# Patient Record
Sex: Female | Born: 1935
Health system: Southern US, Community
[De-identification: ages and names within clinical notes are randomized; demographics above are authoritative.]

## PROBLEM LIST (undated history)

## (undated) DIAGNOSIS — K5792 Diverticulitis of intestine, part unspecified, without perforation or abscess without bleeding: Secondary | ICD-10-CM

## (undated) DIAGNOSIS — C50919 Malignant neoplasm of unspecified site of unspecified female breast: Secondary | ICD-10-CM

## (undated) DIAGNOSIS — M069 Rheumatoid arthritis, unspecified: Secondary | ICD-10-CM

## (undated) DIAGNOSIS — E039 Hypothyroidism, unspecified: Secondary | ICD-10-CM

## (undated) DIAGNOSIS — Z923 Personal history of irradiation: Secondary | ICD-10-CM

## (undated) DIAGNOSIS — Z9221 Personal history of antineoplastic chemotherapy: Secondary | ICD-10-CM

## (undated) DIAGNOSIS — I10 Essential (primary) hypertension: Secondary | ICD-10-CM

## (undated) DIAGNOSIS — N390 Urinary tract infection, site not specified: Secondary | ICD-10-CM

## (undated) DIAGNOSIS — R7303 Prediabetes: Secondary | ICD-10-CM

## (undated) DIAGNOSIS — E785 Hyperlipidemia, unspecified: Secondary | ICD-10-CM

## (undated) DIAGNOSIS — O2230 Deep phlebothrombosis in pregnancy, unspecified trimester: Secondary | ICD-10-CM

## (undated) DIAGNOSIS — D49519 Neoplasm of unspecified behavior of unspecified kidney: Secondary | ICD-10-CM

## (undated) DIAGNOSIS — R9439 Abnormal result of other cardiovascular function study: Secondary | ICD-10-CM

## (undated) HISTORY — PX: CERVICAL SPINE SURGERY: SHX589

## (undated) HISTORY — DX: Prediabetes: R73.03

## (undated) HISTORY — DX: Deep phlebothrombosis in pregnancy, unspecified trimester: O22.30

## (undated) HISTORY — PX: ABDOMINAL SURGERY: SHX537

## (undated) HISTORY — DX: Hyperlipidemia, unspecified: E78.5

## (undated) HISTORY — DX: Diverticulitis of intestine, part unspecified, without perforation or abscess without bleeding: K57.92

## (undated) HISTORY — PX: TONSILLECTOMY: SUR1361

## (undated) HISTORY — DX: Neoplasm of unspecified behavior of unspecified kidney: D49.519

## (undated) HISTORY — PX: CARDIAC CATHETERIZATION: SHX172

## (undated) HISTORY — DX: Abnormal result of other cardiovascular function study: R94.39

## (undated) HISTORY — DX: Essential (primary) hypertension: I10

## (undated) HISTORY — DX: Hypothyroidism, unspecified: E03.9

## (undated) HISTORY — DX: Urinary tract infection, site not specified: N39.0

## (undated) HISTORY — PX: BREAST LUMPECTOMY: SHX2

## (undated) HISTORY — PX: OTHER SURGICAL HISTORY: SHX169

## (undated) HISTORY — DX: Malignant neoplasm of unspecified site of unspecified female breast: C50.919

## (undated) HISTORY — PX: OVARIAN CYST REMOVAL: SHX89

## (undated) HISTORY — DX: Rheumatoid arthritis, unspecified: M06.9

---

## 1998-06-20 ENCOUNTER — Ambulatory Visit (HOSPITAL_COMMUNITY): Admission: RE | Admit: 1998-06-20 | Discharge: 1998-06-20 | Payer: Self-pay | Admitting: Gastroenterology

## 1998-12-20 ENCOUNTER — Other Ambulatory Visit: Admission: RE | Admit: 1998-12-20 | Discharge: 1998-12-20 | Payer: Self-pay | Admitting: Obstetrics and Gynecology

## 1999-02-18 ENCOUNTER — Ambulatory Visit (HOSPITAL_COMMUNITY): Admission: RE | Admit: 1999-02-18 | Discharge: 1999-02-18 | Payer: Self-pay | Admitting: Gastroenterology

## 1999-04-02 ENCOUNTER — Ambulatory Visit (HOSPITAL_COMMUNITY): Admission: RE | Admit: 1999-04-02 | Discharge: 1999-04-02 | Payer: Self-pay | Admitting: General Surgery

## 2000-01-07 ENCOUNTER — Other Ambulatory Visit: Admission: RE | Admit: 2000-01-07 | Discharge: 2000-01-07 | Payer: Self-pay | Admitting: Family Medicine

## 2000-04-03 ENCOUNTER — Encounter (INDEPENDENT_AMBULATORY_CARE_PROVIDER_SITE_OTHER): Payer: Self-pay

## 2000-04-03 ENCOUNTER — Ambulatory Visit (HOSPITAL_COMMUNITY): Admission: RE | Admit: 2000-04-03 | Discharge: 2000-04-03 | Payer: Self-pay | Admitting: Obstetrics and Gynecology

## 2000-12-17 ENCOUNTER — Other Ambulatory Visit: Admission: RE | Admit: 2000-12-17 | Discharge: 2000-12-17 | Payer: Self-pay | Admitting: Obstetrics and Gynecology

## 2001-10-30 ENCOUNTER — Encounter: Payer: Self-pay | Admitting: Neurosurgery

## 2001-10-30 ENCOUNTER — Ambulatory Visit (HOSPITAL_COMMUNITY): Admission: RE | Admit: 2001-10-30 | Discharge: 2001-10-30 | Payer: Self-pay | Admitting: Neurosurgery

## 2001-11-17 ENCOUNTER — Encounter: Payer: Self-pay | Admitting: Neurosurgery

## 2001-11-17 ENCOUNTER — Encounter: Admission: RE | Admit: 2001-11-17 | Discharge: 2001-11-17 | Payer: Self-pay | Admitting: Neurosurgery

## 2002-02-10 ENCOUNTER — Encounter: Payer: Self-pay | Admitting: Neurosurgery

## 2002-02-10 ENCOUNTER — Encounter: Admission: RE | Admit: 2002-02-10 | Discharge: 2002-02-10 | Payer: Self-pay | Admitting: Neurosurgery

## 2002-10-14 ENCOUNTER — Other Ambulatory Visit: Admission: RE | Admit: 2002-10-14 | Discharge: 2002-10-14 | Payer: Self-pay | Admitting: Obstetrics and Gynecology

## 2003-11-07 ENCOUNTER — Encounter (INDEPENDENT_AMBULATORY_CARE_PROVIDER_SITE_OTHER): Payer: Self-pay | Admitting: *Deleted

## 2003-11-07 ENCOUNTER — Inpatient Hospital Stay (HOSPITAL_COMMUNITY): Admission: RE | Admit: 2003-11-07 | Discharge: 2003-11-09 | Payer: Self-pay | Admitting: Obstetrics and Gynecology

## 2005-04-23 ENCOUNTER — Ambulatory Visit (HOSPITAL_COMMUNITY): Admission: RE | Admit: 2005-04-23 | Discharge: 2005-04-23 | Payer: Self-pay | Admitting: Neurosurgery

## 2006-10-13 DIAGNOSIS — C50919 Malignant neoplasm of unspecified site of unspecified female breast: Secondary | ICD-10-CM

## 2006-10-13 HISTORY — DX: Malignant neoplasm of unspecified site of unspecified female breast: C50.919

## 2007-02-10 ENCOUNTER — Encounter: Admission: RE | Admit: 2007-02-10 | Discharge: 2007-02-10 | Payer: Self-pay | Admitting: Gastroenterology

## 2007-05-10 ENCOUNTER — Encounter: Admission: RE | Admit: 2007-05-10 | Discharge: 2007-05-10 | Payer: Self-pay | Admitting: Family Medicine

## 2007-05-10 ENCOUNTER — Encounter (INDEPENDENT_AMBULATORY_CARE_PROVIDER_SITE_OTHER): Payer: Self-pay | Admitting: Diagnostic Radiology

## 2007-05-20 ENCOUNTER — Encounter: Admission: RE | Admit: 2007-05-20 | Discharge: 2007-05-20 | Payer: Self-pay | Admitting: Family Medicine

## 2007-05-31 ENCOUNTER — Encounter: Admission: RE | Admit: 2007-05-31 | Discharge: 2007-05-31 | Payer: Self-pay | Admitting: General Surgery

## 2007-06-02 ENCOUNTER — Ambulatory Visit (HOSPITAL_BASED_OUTPATIENT_CLINIC_OR_DEPARTMENT_OTHER): Admission: RE | Admit: 2007-06-02 | Discharge: 2007-06-02 | Payer: Self-pay | Admitting: Urology

## 2007-06-02 ENCOUNTER — Encounter (INDEPENDENT_AMBULATORY_CARE_PROVIDER_SITE_OTHER): Payer: Self-pay | Admitting: General Surgery

## 2007-06-07 ENCOUNTER — Encounter (INDEPENDENT_AMBULATORY_CARE_PROVIDER_SITE_OTHER): Payer: Self-pay | Admitting: General Surgery

## 2007-06-08 ENCOUNTER — Ambulatory Visit: Payer: Self-pay | Admitting: Oncology

## 2007-06-16 LAB — CBC WITH DIFFERENTIAL/PLATELET
Basophils Absolute: 0 10*3/uL (ref 0.0–0.1)
EOS%: 3.3 % (ref 0.0–7.0)
Eosinophils Absolute: 0.2 10*3/uL (ref 0.0–0.5)
HCT: 34.1 % — ABNORMAL LOW (ref 34.8–46.6)
HGB: 11.8 g/dL (ref 11.6–15.9)
MCH: 32.5 pg (ref 26.0–34.0)
MCV: 93.9 fL (ref 81.0–101.0)
NEUT#: 5.8 10*3/uL (ref 1.5–6.5)
NEUT%: 78.1 % — ABNORMAL HIGH (ref 39.6–76.8)
lymph#: 0.8 10*3/uL — ABNORMAL LOW (ref 0.9–3.3)

## 2007-06-21 LAB — COMPREHENSIVE METABOLIC PANEL
AST: 25 U/L (ref 0–37)
Albumin: 4 g/dL (ref 3.5–5.2)
BUN: 14 mg/dL (ref 6–23)
Calcium: 9.2 mg/dL (ref 8.4–10.5)
Chloride: 105 mEq/L (ref 96–112)
Creatinine, Ser: 0.72 mg/dL (ref 0.40–1.20)
Glucose, Bld: 132 mg/dL — ABNORMAL HIGH (ref 70–99)
Potassium: 4 mEq/L (ref 3.5–5.3)

## 2007-06-21 LAB — VITAMIN D PNL(25-HYDRXY+1,25-DIHY)-BLD: Vit D, 1,25-Dihydroxy: 44 pg/mL (ref 6–62)

## 2007-06-21 LAB — CANCER ANTIGEN 27.29: CA 27.29: 12 U/mL (ref 0–39)

## 2007-06-23 ENCOUNTER — Encounter: Admission: RE | Admit: 2007-06-23 | Discharge: 2007-06-23 | Payer: Self-pay | Admitting: Rheumatology

## 2007-06-24 ENCOUNTER — Ambulatory Visit: Admission: RE | Admit: 2007-06-24 | Discharge: 2007-06-24 | Payer: Self-pay | Admitting: Oncology

## 2007-06-24 ENCOUNTER — Encounter: Payer: Self-pay | Admitting: Oncology

## 2007-06-25 ENCOUNTER — Ambulatory Visit (HOSPITAL_COMMUNITY): Admission: RE | Admit: 2007-06-25 | Discharge: 2007-06-25 | Payer: Self-pay | Admitting: Oncology

## 2007-06-29 LAB — CBC WITH DIFFERENTIAL/PLATELET
Basophils Absolute: 0.1 10*3/uL (ref 0.0–0.1)
EOS%: 2.2 % (ref 0.0–7.0)
HGB: 13.3 g/dL (ref 11.6–15.9)
MCH: 31.8 pg (ref 26.0–34.0)
MCV: 90.7 fL (ref 81.0–101.0)
MONO%: 8.5 % (ref 0.0–13.0)
RDW: 11.1 % — ABNORMAL LOW (ref 11.3–14.5)

## 2007-07-05 ENCOUNTER — Ambulatory Visit (HOSPITAL_COMMUNITY): Admission: RE | Admit: 2007-07-05 | Discharge: 2007-07-05 | Payer: Self-pay | Admitting: General Surgery

## 2007-07-06 LAB — CBC WITH DIFFERENTIAL/PLATELET
BASO%: 1.3 % (ref 0.0–2.0)
EOS%: 2.5 % (ref 0.0–7.0)
Eosinophils Absolute: 0.2 10*3/uL (ref 0.0–0.5)
MCH: 31.5 pg (ref 26.0–34.0)
MCHC: 34.9 g/dL (ref 32.0–36.0)
MCV: 90.3 fL (ref 81.0–101.0)
MONO%: 5.7 % (ref 0.0–13.0)
NEUT#: 5.8 10*3/uL (ref 1.5–6.5)
RBC: 3.92 10*6/uL (ref 3.70–5.32)
RDW: 10.7 % — ABNORMAL LOW (ref 11.3–14.5)

## 2007-07-13 LAB — CBC WITH DIFFERENTIAL/PLATELET
BASO%: 1.2 % (ref 0.0–2.0)
Eosinophils Absolute: 0.2 10*3/uL (ref 0.0–0.5)
MONO#: 0.5 10*3/uL (ref 0.1–0.9)
NEUT#: 3.5 10*3/uL (ref 1.5–6.5)
RBC: 3.56 10*6/uL — ABNORMAL LOW (ref 3.70–5.32)
RDW: 11.3 % (ref 11.3–14.5)
WBC: 5.8 10*3/uL (ref 3.9–10.0)

## 2007-07-13 LAB — COMPREHENSIVE METABOLIC PANEL
Alkaline Phosphatase: 81 U/L (ref 39–117)
BUN: 17 mg/dL (ref 6–23)
Glucose, Bld: 98 mg/dL (ref 70–99)
Total Bilirubin: 0.3 mg/dL (ref 0.3–1.2)

## 2007-07-20 ENCOUNTER — Ambulatory Visit (HOSPITAL_COMMUNITY): Admission: RE | Admit: 2007-07-20 | Discharge: 2007-07-20 | Payer: Self-pay | Admitting: Oncology

## 2007-07-20 LAB — CBC WITH DIFFERENTIAL/PLATELET
Basophils Absolute: 0.1 10*3/uL (ref 0.0–0.1)
Eosinophils Absolute: 0.2 10*3/uL (ref 0.0–0.5)
HGB: 11.1 g/dL — ABNORMAL LOW (ref 11.6–15.9)
LYMPH%: 28.1 % (ref 14.0–48.0)
MCV: 92.3 fL (ref 81.0–101.0)
MONO#: 0.4 10*3/uL (ref 0.1–0.9)
MONO%: 10.9 % (ref 0.0–13.0)
NEUT#: 2.1 10*3/uL (ref 1.5–6.5)
Platelets: 222 10*3/uL (ref 145–400)
RDW: 11.2 % — ABNORMAL LOW (ref 11.3–14.5)
WBC: 3.9 10*3/uL (ref 3.9–10.0)

## 2007-07-23 ENCOUNTER — Ambulatory Visit: Payer: Self-pay | Admitting: Oncology

## 2007-07-27 LAB — CBC WITH DIFFERENTIAL/PLATELET
Eosinophils Absolute: 0.1 10*3/uL (ref 0.0–0.5)
HCT: 34.3 % — ABNORMAL LOW (ref 34.8–46.6)
LYMPH%: 13.1 % — ABNORMAL LOW (ref 14.0–48.0)
MCV: 94.4 fL (ref 81.0–101.0)
MONO#: 0.3 10*3/uL (ref 0.1–0.9)
MONO%: 6.5 % (ref 0.0–13.0)
NEUT#: 3.5 10*3/uL (ref 1.5–6.5)
NEUT%: 76.4 % (ref 39.6–76.8)
Platelets: 242 10*3/uL (ref 145–400)
RBC: 3.63 10*6/uL — ABNORMAL LOW (ref 3.70–5.32)

## 2007-08-03 LAB — CBC WITH DIFFERENTIAL/PLATELET
BASO%: 1.1 % (ref 0.0–2.0)
EOS%: 2.1 % (ref 0.0–7.0)
HCT: 34.1 % — ABNORMAL LOW (ref 34.8–46.6)
MCH: 32.1 pg (ref 26.0–34.0)
MCHC: 34.7 g/dL (ref 32.0–36.0)
MONO#: 0.6 10*3/uL (ref 0.1–0.9)
NEUT%: 80.2 % — ABNORMAL HIGH (ref 39.6–76.8)
RBC: 3.69 10*6/uL — ABNORMAL LOW (ref 3.70–5.32)
WBC: 8.1 10*3/uL (ref 3.9–10.0)
lymph#: 0.8 10*3/uL — ABNORMAL LOW (ref 0.9–3.3)

## 2007-08-10 LAB — CBC WITH DIFFERENTIAL/PLATELET
BASO%: 1.4 % (ref 0.0–2.0)
EOS%: 4.7 % (ref 0.0–7.0)
HCT: 35.5 % (ref 34.8–46.6)
LYMPH%: 19.8 % (ref 14.0–48.0)
MCH: 31.7 pg (ref 26.0–34.0)
MCHC: 34.7 g/dL (ref 32.0–36.0)
MCV: 91.6 fL (ref 81.0–101.0)
MONO#: 0.4 10*3/uL (ref 0.1–0.9)
MONO%: 6.6 % (ref 0.0–13.0)
NEUT%: 67.6 % (ref 39.6–76.8)
Platelets: 206 10*3/uL (ref 145–400)
RBC: 3.88 10*6/uL (ref 3.70–5.32)
WBC: 6.1 10*3/uL (ref 3.9–10.0)

## 2007-08-17 LAB — CBC WITH DIFFERENTIAL/PLATELET
Eosinophils Absolute: 0.3 10*3/uL (ref 0.0–0.5)
HCT: 34.1 % — ABNORMAL LOW (ref 34.8–46.6)
LYMPH%: 22.1 % (ref 14.0–48.0)
MONO#: 0.4 10*3/uL (ref 0.1–0.9)
NEUT#: 2.8 10*3/uL (ref 1.5–6.5)
Platelets: 204 10*3/uL (ref 145–400)
RBC: 3.73 10*6/uL (ref 3.70–5.32)
WBC: 4.6 10*3/uL (ref 3.9–10.0)
lymph#: 1 10*3/uL (ref 0.9–3.3)

## 2007-08-24 LAB — CBC WITH DIFFERENTIAL/PLATELET
Basophils Absolute: 0.1 10*3/uL (ref 0.0–0.1)
Eosinophils Absolute: 0.2 10*3/uL (ref 0.0–0.5)
HCT: 35.1 % (ref 34.8–46.6)
HGB: 12.1 g/dL (ref 11.6–15.9)
LYMPH%: 20.2 % (ref 14.0–48.0)
MCHC: 34.6 g/dL (ref 32.0–36.0)
MONO#: 0.5 10*3/uL (ref 0.1–0.9)
NEUT#: 3.8 10*3/uL (ref 1.5–6.5)
NEUT%: 66.1 % (ref 39.6–76.8)
Platelets: 241 10*3/uL (ref 145–400)
WBC: 5.8 10*3/uL (ref 3.9–10.0)
lymph#: 1.2 10*3/uL (ref 0.9–3.3)

## 2007-08-31 LAB — CBC WITH DIFFERENTIAL/PLATELET
BASO%: 0.2 % (ref 0.0–2.0)
Basophils Absolute: 0 10*3/uL (ref 0.0–0.1)
EOS%: 1.7 % (ref 0.0–7.0)
HCT: 35.3 % (ref 34.8–46.6)
HGB: 12.1 g/dL (ref 11.6–15.9)
LYMPH%: 11.8 % — ABNORMAL LOW (ref 14.0–48.0)
MCH: 31.2 pg (ref 26.0–34.0)
MCHC: 34.3 g/dL (ref 32.0–36.0)
MCV: 90.8 fL (ref 81.0–101.0)
NEUT%: 82.2 % — ABNORMAL HIGH (ref 39.6–76.8)
Platelets: 220 10*3/uL (ref 145–400)

## 2007-08-31 LAB — COMPREHENSIVE METABOLIC PANEL
ALT: 29 U/L (ref 0–35)
AST: 26 U/L (ref 0–37)
BUN: 16 mg/dL (ref 6–23)
Calcium: 9.2 mg/dL (ref 8.4–10.5)
Chloride: 107 mEq/L (ref 96–112)
Creatinine, Ser: 0.87 mg/dL (ref 0.40–1.20)
Total Bilirubin: 0.6 mg/dL (ref 0.3–1.2)

## 2007-09-17 ENCOUNTER — Ambulatory Visit: Payer: Self-pay | Admitting: Oncology

## 2007-09-21 ENCOUNTER — Ambulatory Visit: Admission: RE | Admit: 2007-09-21 | Discharge: 2007-10-13 | Payer: Self-pay | Admitting: Radiation Oncology

## 2007-09-21 LAB — CBC WITH DIFFERENTIAL/PLATELET
Basophils Absolute: 0.1 10*3/uL (ref 0.0–0.1)
HCT: 34.4 % — ABNORMAL LOW (ref 34.8–46.6)
HGB: 12.1 g/dL (ref 11.6–15.9)
LYMPH%: 12.7 % — ABNORMAL LOW (ref 14.0–48.0)
MCH: 31 pg (ref 26.0–34.0)
MCHC: 35.2 g/dL (ref 32.0–36.0)
MONO#: 0.6 10*3/uL (ref 0.1–0.9)
NEUT%: 70.6 % (ref 39.6–76.8)
Platelets: 179 10*3/uL (ref 145–400)
WBC: 6.7 10*3/uL (ref 3.9–10.0)
lymph#: 0.9 10*3/uL (ref 0.9–3.3)

## 2007-09-29 ENCOUNTER — Ambulatory Visit: Admission: RE | Admit: 2007-09-29 | Discharge: 2007-09-29 | Payer: Self-pay | Admitting: Oncology

## 2007-09-29 ENCOUNTER — Ambulatory Visit: Payer: Self-pay | Admitting: Cardiology

## 2007-09-29 ENCOUNTER — Encounter: Payer: Self-pay | Admitting: Oncology

## 2007-10-12 LAB — CBC WITH DIFFERENTIAL/PLATELET
BASO%: 0.2 % (ref 0.0–2.0)
Basophils Absolute: 0 10*3/uL (ref 0.0–0.1)
EOS%: 3.8 % (ref 0.0–7.0)
HCT: 34.1 % — ABNORMAL LOW (ref 34.8–46.6)
HGB: 11.8 g/dL (ref 11.6–15.9)
LYMPH%: 10 % — ABNORMAL LOW (ref 14.0–48.0)
MCH: 31.2 pg (ref 26.0–34.0)
MCHC: 34.7 g/dL (ref 32.0–36.0)
MCV: 90 fL (ref 81.0–101.0)
MONO%: 0.8 % (ref 0.0–13.0)
NEUT%: 85.2 % — ABNORMAL HIGH (ref 39.6–76.8)

## 2007-10-12 LAB — COMPREHENSIVE METABOLIC PANEL
Albumin: 4 g/dL (ref 3.5–5.2)
BUN: 17 mg/dL (ref 6–23)
CO2: 28 mEq/L (ref 19–32)
Calcium: 9.3 mg/dL (ref 8.4–10.5)
Chloride: 107 mEq/L (ref 96–112)
Glucose, Bld: 110 mg/dL — ABNORMAL HIGH (ref 70–99)
Potassium: 4.1 mEq/L (ref 3.5–5.3)

## 2007-10-14 ENCOUNTER — Ambulatory Visit: Admission: RE | Admit: 2007-10-14 | Discharge: 2007-12-19 | Payer: Self-pay | Admitting: Radiation Oncology

## 2007-10-15 ENCOUNTER — Ambulatory Visit: Payer: Self-pay | Admitting: Oncology

## 2007-10-19 LAB — CBC WITH DIFFERENTIAL/PLATELET
Eosinophils Absolute: 0.3 10*3/uL (ref 0.0–0.5)
MCV: 89.9 fL (ref 81.0–101.0)
MONO%: 10.4 % (ref 0.0–13.0)
NEUT#: 5.3 10*3/uL (ref 1.5–6.5)
RBC: 3.98 10*6/uL (ref 3.70–5.32)
RDW: 11.7 % (ref 11.3–14.5)
WBC: 8 10*3/uL (ref 3.9–10.0)

## 2007-10-26 LAB — CBC WITH DIFFERENTIAL/PLATELET
Eosinophils Absolute: 0.4 10*3/uL (ref 0.0–0.5)
LYMPH%: 15.3 % (ref 14.0–48.0)
MONO#: 0.9 10*3/uL (ref 0.1–0.9)
NEUT#: 4.6 10*3/uL (ref 1.5–6.5)
Platelets: 245 10*3/uL (ref 145–400)
RBC: 3.97 10*6/uL (ref 3.70–5.32)
RDW: 11.9 % (ref 11.3–14.5)
WBC: 7.2 10*3/uL (ref 3.9–10.0)
lymph#: 1.1 10*3/uL (ref 0.9–3.3)

## 2007-11-02 LAB — CBC WITH DIFFERENTIAL/PLATELET
Basophils Absolute: 0.1 10*3/uL (ref 0.0–0.1)
Eosinophils Absolute: 0.3 10*3/uL (ref 0.0–0.5)
HCT: 36.1 % (ref 34.8–46.6)
HGB: 12.3 g/dL (ref 11.6–15.9)
LYMPH%: 11.2 % — ABNORMAL LOW (ref 14.0–48.0)
MONO#: 0.9 10*3/uL (ref 0.1–0.9)
NEUT#: 5.4 10*3/uL (ref 1.5–6.5)
NEUT%: 71.7 % (ref 39.6–76.8)
Platelets: 208 10*3/uL (ref 145–400)
WBC: 7.5 10*3/uL (ref 3.9–10.0)

## 2007-11-09 LAB — CBC WITH DIFFERENTIAL/PLATELET
Eosinophils Absolute: 0.3 10*3/uL (ref 0.0–0.5)
HCT: 35 % (ref 34.8–46.6)
HGB: 12.1 g/dL (ref 11.6–15.9)
LYMPH%: 8.8 % — ABNORMAL LOW (ref 14.0–48.0)
MONO#: 0.9 10*3/uL (ref 0.1–0.9)
NEUT#: 6.5 10*3/uL (ref 1.5–6.5)
NEUT%: 76.5 % (ref 39.6–76.8)
Platelets: 185 10*3/uL (ref 145–400)
WBC: 8.6 10*3/uL (ref 3.9–10.0)

## 2007-11-16 LAB — COMPREHENSIVE METABOLIC PANEL
ALT: 18 U/L (ref 0–35)
AST: 21 U/L (ref 0–37)
Alkaline Phosphatase: 78 U/L (ref 39–117)
BUN: 16 mg/dL (ref 6–23)
Creatinine, Ser: 0.75 mg/dL (ref 0.40–1.20)
Total Bilirubin: 0.4 mg/dL (ref 0.3–1.2)

## 2007-11-16 LAB — CBC WITH DIFFERENTIAL/PLATELET
BASO%: 1.3 % (ref 0.0–2.0)
MCHC: 34.8 g/dL (ref 32.0–36.0)
MONO#: 0.8 10*3/uL (ref 0.1–0.9)
RBC: 4.05 10*6/uL (ref 3.70–5.32)
RDW: 11.4 % (ref 11.3–14.5)
WBC: 10.1 10*3/uL — ABNORMAL HIGH (ref 3.9–10.0)
lymph#: 0.6 10*3/uL — ABNORMAL LOW (ref 0.9–3.3)

## 2007-11-16 LAB — CANCER ANTIGEN 27.29: CA 27.29: 7 U/mL (ref 0–39)

## 2007-11-23 LAB — CBC WITH DIFFERENTIAL/PLATELET
BASO%: 0.5 % (ref 0.0–2.0)
Basophils Absolute: 0 10*3/uL (ref 0.0–0.1)
HCT: 34.9 % (ref 34.8–46.6)
HGB: 12.2 g/dL (ref 11.6–15.9)
MONO#: 0.6 10*3/uL (ref 0.1–0.9)
NEUT%: 75.7 % (ref 39.6–76.8)
RDW: 13.5 % (ref 11.3–14.5)
WBC: 6 10*3/uL (ref 3.9–10.0)
lymph#: 0.7 10*3/uL — ABNORMAL LOW (ref 0.9–3.3)

## 2007-12-09 ENCOUNTER — Ambulatory Visit: Payer: Self-pay | Admitting: Oncology

## 2007-12-14 LAB — CBC WITH DIFFERENTIAL/PLATELET
BASO%: 0.9 % (ref 0.0–2.0)
Basophils Absolute: 0.1 10*3/uL (ref 0.0–0.1)
EOS%: 2.3 % (ref 0.0–7.0)
Eosinophils Absolute: 0.2 10*3/uL (ref 0.0–0.5)
HCT: 35.9 % (ref 34.8–46.6)
HGB: 12.6 g/dL (ref 11.6–15.9)
LYMPH%: 8.5 % — ABNORMAL LOW (ref 14.0–48.0)
MCH: 31.6 pg (ref 26.0–34.0)
MCHC: 35.1 g/dL (ref 32.0–36.0)
MCV: 89.9 fL (ref 81.0–101.0)
MONO#: 0.8 10*3/uL (ref 0.1–0.9)
MONO%: 11.7 % (ref 0.0–13.0)
NEUT#: 5.5 10*3/uL (ref 1.5–6.5)
NEUT%: 76.6 % (ref 39.6–76.8)
Platelets: 212 10*3/uL (ref 145–400)
RBC: 3.99 10*6/uL (ref 3.70–5.32)
RDW: 12.4 % (ref 11.3–14.5)
WBC: 7.2 10*3/uL (ref 3.9–10.0)
lymph#: 0.6 10*3/uL — ABNORMAL LOW (ref 0.9–3.3)

## 2007-12-29 ENCOUNTER — Ambulatory Visit: Payer: Self-pay | Admitting: Cardiology

## 2007-12-29 ENCOUNTER — Ambulatory Visit: Admission: RE | Admit: 2007-12-29 | Discharge: 2007-12-29 | Payer: Self-pay | Admitting: Oncology

## 2007-12-29 ENCOUNTER — Encounter: Payer: Self-pay | Admitting: Oncology

## 2008-01-05 LAB — CBC WITH DIFFERENTIAL/PLATELET
BASO%: 1.4 % (ref 0.0–2.0)
Basophils Absolute: 0.2 10*3/uL — ABNORMAL HIGH (ref 0.0–0.1)
EOS%: 1.2 % (ref 0.0–7.0)
HCT: 35.8 % (ref 34.8–46.6)
HGB: 12.6 g/dL (ref 11.6–15.9)
MCH: 31.4 pg (ref 26.0–34.0)
MCHC: 35.2 g/dL (ref 32.0–36.0)
MCV: 89.2 fL (ref 81.0–101.0)
MONO%: 6.9 % (ref 0.0–13.0)
NEUT%: 85.8 % — ABNORMAL HIGH (ref 39.6–76.8)

## 2008-02-07 ENCOUNTER — Ambulatory Visit (HOSPITAL_BASED_OUTPATIENT_CLINIC_OR_DEPARTMENT_OTHER): Admission: RE | Admit: 2008-02-07 | Discharge: 2008-02-07 | Payer: Self-pay | Admitting: General Surgery

## 2008-03-29 ENCOUNTER — Ambulatory Visit (HOSPITAL_COMMUNITY): Admission: RE | Admit: 2008-03-29 | Discharge: 2008-03-29 | Payer: Self-pay | Admitting: Urology

## 2008-04-18 ENCOUNTER — Encounter: Admission: RE | Admit: 2008-04-18 | Discharge: 2008-04-18 | Payer: Self-pay | Admitting: General Surgery

## 2008-05-31 ENCOUNTER — Encounter: Admission: RE | Admit: 2008-05-31 | Discharge: 2008-05-31 | Payer: Self-pay | Admitting: Rheumatology

## 2008-07-07 ENCOUNTER — Ambulatory Visit: Payer: Self-pay | Admitting: Oncology

## 2008-07-11 ENCOUNTER — Ambulatory Visit: Admission: RE | Admit: 2008-07-11 | Discharge: 2008-07-11 | Payer: Self-pay | Admitting: Oncology

## 2008-07-11 ENCOUNTER — Encounter: Payer: Self-pay | Admitting: Oncology

## 2008-07-11 LAB — CBC WITH DIFFERENTIAL/PLATELET
BASO%: 0 % (ref 0.0–2.0)
Basophils Absolute: 0 10e3/uL (ref 0.0–0.1)
EOS%: 2.2 % (ref 0.0–7.0)
Eosinophils Absolute: 0.2 10e3/uL (ref 0.0–0.5)
HCT: 35.9 % (ref 34.8–46.6)
HGB: 12.3 g/dL (ref 11.6–15.9)
LYMPH%: 9.3 % — ABNORMAL LOW (ref 14.0–48.0)
MCH: 32.7 pg (ref 26.0–34.0)
MCHC: 34.3 g/dL (ref 32.0–36.0)
MCV: 95.4 fL (ref 81.0–101.0)
MONO#: 0.8 10e3/uL (ref 0.1–0.9)
MONO%: 9.9 % (ref 0.0–13.0)
NEUT#: 6.2 10e3/uL (ref 1.5–6.5)
NEUT%: 78.6 % — ABNORMAL HIGH (ref 39.6–76.8)
Platelets: 219 10e3/uL (ref 145–400)
RBC: 3.76 10e6/uL (ref 3.70–5.32)
RDW: 14.3 % (ref 11.3–14.5)
WBC: 7.9 10e3/uL (ref 3.9–10.0)
lymph#: 0.7 10e3/uL — ABNORMAL LOW (ref 0.9–3.3)

## 2008-07-11 LAB — COMPREHENSIVE METABOLIC PANEL WITH GFR
ALT: 16 U/L (ref 0–35)
AST: 18 U/L (ref 0–37)
Albumin: 4.1 g/dL (ref 3.5–5.2)
Alkaline Phosphatase: 84 U/L (ref 39–117)
BUN: 15 mg/dL (ref 6–23)
CO2: 28 meq/L (ref 19–32)
Calcium: 9.4 mg/dL (ref 8.4–10.5)
Chloride: 99 meq/L (ref 96–112)
Creatinine, Ser: 0.83 mg/dL (ref 0.40–1.20)
Glucose, Bld: 104 mg/dL — ABNORMAL HIGH (ref 70–99)
Potassium: 3.7 meq/L (ref 3.5–5.3)
Sodium: 136 meq/L (ref 135–145)
Total Bilirubin: 0.4 mg/dL (ref 0.3–1.2)
Total Protein: 6.5 g/dL (ref 6.0–8.3)

## 2008-07-11 LAB — CANCER ANTIGEN 27.29: CA 27.29: 12 U/mL (ref 0–39)

## 2008-09-12 ENCOUNTER — Ambulatory Visit (HOSPITAL_COMMUNITY): Admission: RE | Admit: 2008-09-12 | Discharge: 2008-09-12 | Payer: Self-pay | Admitting: Urology

## 2008-10-13 DIAGNOSIS — D49519 Neoplasm of unspecified behavior of unspecified kidney: Secondary | ICD-10-CM

## 2008-10-13 HISTORY — DX: Neoplasm of unspecified behavior of unspecified kidney: D49.519

## 2008-10-17 ENCOUNTER — Encounter: Admission: RE | Admit: 2008-10-17 | Discharge: 2008-10-17 | Payer: Self-pay | Admitting: Urology

## 2008-10-20 ENCOUNTER — Ambulatory Visit: Payer: Self-pay | Admitting: Oncology

## 2008-10-24 ENCOUNTER — Ambulatory Visit (HOSPITAL_COMMUNITY): Admission: RE | Admit: 2008-10-24 | Discharge: 2008-10-24 | Payer: Self-pay | Admitting: Oncology

## 2008-10-24 LAB — CBC WITH DIFFERENTIAL/PLATELET
BASO%: 0.3 % (ref 0.0–2.0)
Eosinophils Absolute: 0.2 10*3/uL (ref 0.0–0.5)
MCHC: 34.3 g/dL (ref 32.0–36.0)
MCV: 98.4 fL (ref 81.0–101.0)
MONO#: 0.6 10*3/uL (ref 0.1–0.9)
MONO%: 9.2 % (ref 0.0–13.0)
NEUT#: 5.2 10*3/uL (ref 1.5–6.5)
RBC: 3.8 10*6/uL (ref 3.70–5.32)
RDW: 14.8 % — ABNORMAL HIGH (ref 11.3–14.5)
WBC: 6.6 10*3/uL (ref 3.9–10.0)

## 2008-10-24 LAB — COMPREHENSIVE METABOLIC PANEL
ALT: 27 U/L (ref 0–35)
Albumin: 3.9 g/dL (ref 3.5–5.2)
Alkaline Phosphatase: 64 U/L (ref 39–117)
Glucose, Bld: 102 mg/dL — ABNORMAL HIGH (ref 70–99)
Potassium: 3.4 mEq/L — ABNORMAL LOW (ref 3.5–5.3)
Sodium: 134 mEq/L — ABNORMAL LOW (ref 135–145)
Total Bilirubin: 0.4 mg/dL (ref 0.3–1.2)
Total Protein: 6.6 g/dL (ref 6.0–8.3)

## 2008-11-17 ENCOUNTER — Encounter (INDEPENDENT_AMBULATORY_CARE_PROVIDER_SITE_OTHER): Payer: Self-pay | Admitting: Interventional Radiology

## 2008-11-17 ENCOUNTER — Ambulatory Visit (HOSPITAL_COMMUNITY): Admission: RE | Admit: 2008-11-17 | Discharge: 2008-11-18 | Payer: Self-pay | Admitting: Interventional Radiology

## 2008-11-22 ENCOUNTER — Encounter: Admission: RE | Admit: 2008-11-22 | Discharge: 2008-11-22 | Payer: Self-pay | Admitting: Diagnostic Radiology

## 2008-11-23 ENCOUNTER — Ambulatory Visit (HOSPITAL_COMMUNITY): Admission: RE | Admit: 2008-11-23 | Discharge: 2008-11-23 | Payer: Self-pay | Admitting: Diagnostic Radiology

## 2008-12-27 ENCOUNTER — Encounter: Admission: RE | Admit: 2008-12-27 | Discharge: 2008-12-27 | Payer: Self-pay | Admitting: Urology

## 2009-01-17 ENCOUNTER — Ambulatory Visit (HOSPITAL_COMMUNITY): Admission: RE | Admit: 2009-01-17 | Discharge: 2009-01-17 | Payer: Self-pay | Admitting: Oncology

## 2009-01-18 ENCOUNTER — Ambulatory Visit: Payer: Self-pay | Admitting: Oncology

## 2009-01-22 LAB — CBC WITH DIFFERENTIAL/PLATELET
Eosinophils Absolute: 0.2 10*3/uL (ref 0.0–0.5)
MONO#: 0.6 10*3/uL (ref 0.1–0.9)
NEUT#: 4.2 10*3/uL (ref 1.5–6.5)
Platelets: 228 10*3/uL (ref 145–400)
RBC: 3.88 10*6/uL (ref 3.70–5.45)
RDW: 14.9 % — ABNORMAL HIGH (ref 11.2–14.5)
WBC: 5.6 10*3/uL (ref 3.9–10.3)
lymph#: 0.5 10*3/uL — ABNORMAL LOW (ref 0.9–3.3)

## 2009-01-22 LAB — COMPREHENSIVE METABOLIC PANEL
ALT: 29 U/L (ref 0–35)
Albumin: 3.7 g/dL (ref 3.5–5.2)
CO2: 31 mEq/L (ref 19–32)
Chloride: 99 mEq/L (ref 96–112)
Glucose, Bld: 122 mg/dL — ABNORMAL HIGH (ref 70–99)
Potassium: 3.5 mEq/L (ref 3.5–5.3)
Sodium: 136 mEq/L (ref 135–145)
Total Protein: 6.8 g/dL (ref 6.0–8.3)

## 2009-05-02 ENCOUNTER — Ambulatory Visit (HOSPITAL_COMMUNITY): Admission: RE | Admit: 2009-05-02 | Discharge: 2009-05-02 | Payer: Self-pay | Admitting: Interventional Radiology

## 2009-05-02 ENCOUNTER — Encounter: Admission: RE | Admit: 2009-05-02 | Discharge: 2009-05-02 | Payer: Self-pay | Admitting: Interventional Radiology

## 2009-05-07 ENCOUNTER — Encounter: Admission: RE | Admit: 2009-05-07 | Discharge: 2009-05-07 | Payer: Self-pay | Admitting: Family Medicine

## 2009-05-13 IMAGING — CT CT ABDOMEN WO/W CM
2 of 6 series · 13 of 32 positions shown, 18 images · IV contrast (READICAT/WATER & [ID] OMNI 300)
Comparison: 11/23/2008 and MRI of the abdomen dated 09/12/2008

CT ABDOMEN

CLINICAL DATA: Status post radiofrequency ablation of both right
and left-sided renal tumors on 11/17/2008.  The patient was treated
for a urinary tract infection after the procedure  and was also
scanned of 11/23/2008 due to pain and constipation following the
procedure. She has recently had a complaint of pneumaturia.

CT ABDOMEN WITHOUT AND WITH CONTRAST
CT PELVIS WITH CONTRAST
TECHNIQUE: Multidetector CT imaging of the abdomen was performed
initially following the standard protocol before administration of
intravenous contrast.  Multidetector CT imaging of the abdomen and
pelvis was then performed following the standard protocol during
the bolus injection of intravenous contrast.
Contrast: 100 ml Amnipaque-N88 IV

[Series 4: arterial,venous & delay · axial · arterial · 0.66mm/px · z∈[-264,-24]mm · 8 of 155 slices shown, 13 images]
[im 18/155  soft-tissue]
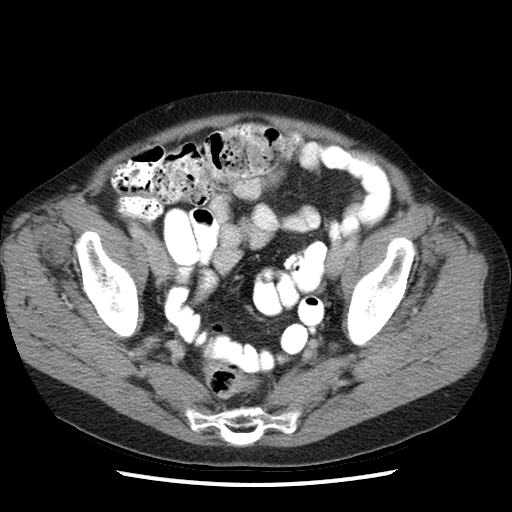
[im 18/155  bone]
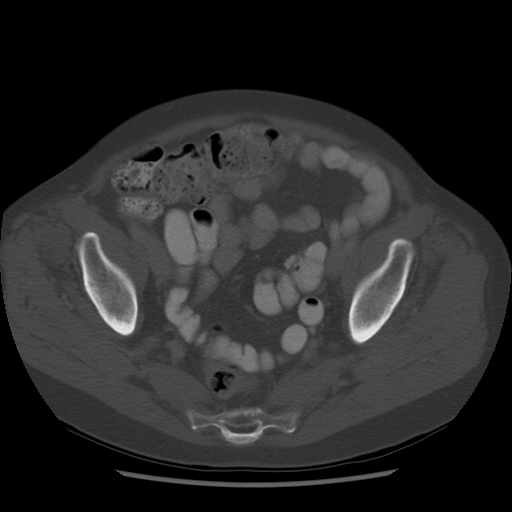
[im 35/155  soft-tissue]
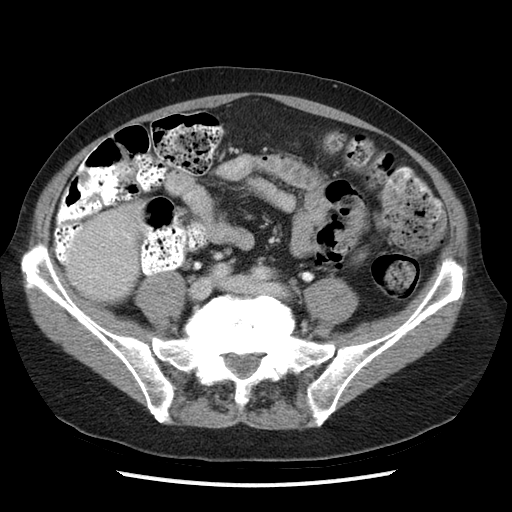
[im 52/155  soft-tissue]
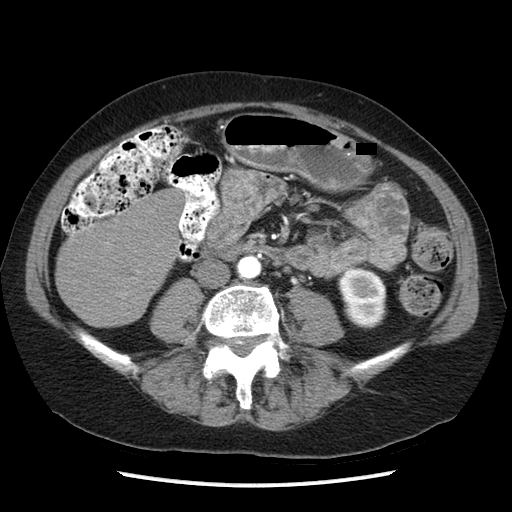
[im 69/155  soft-tissue]
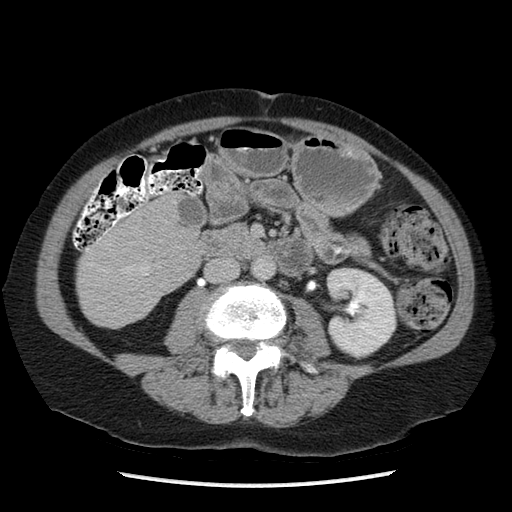
[im 86/155  soft-tissue]
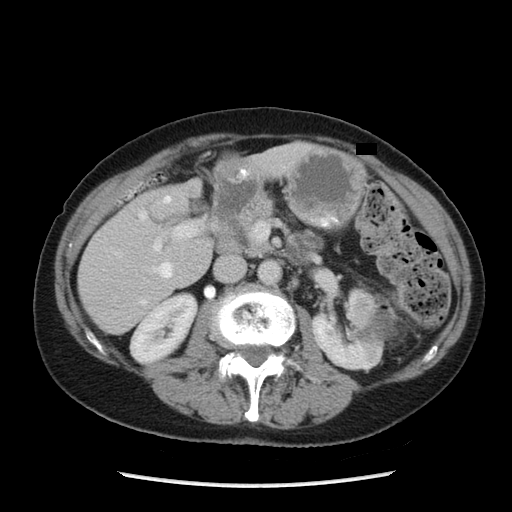
[im 86/155  lung]
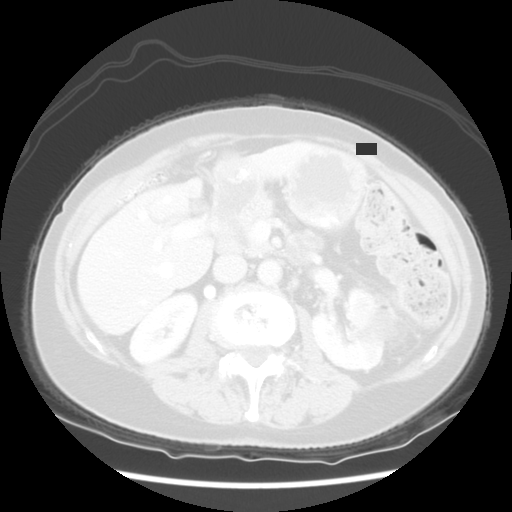
[im 103/155  soft-tissue]
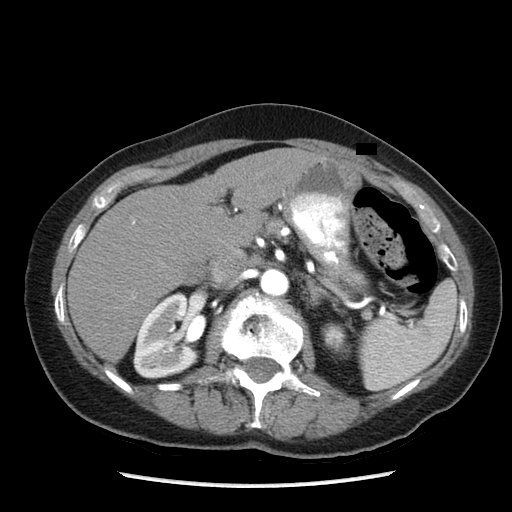
[im 103/155  lung]
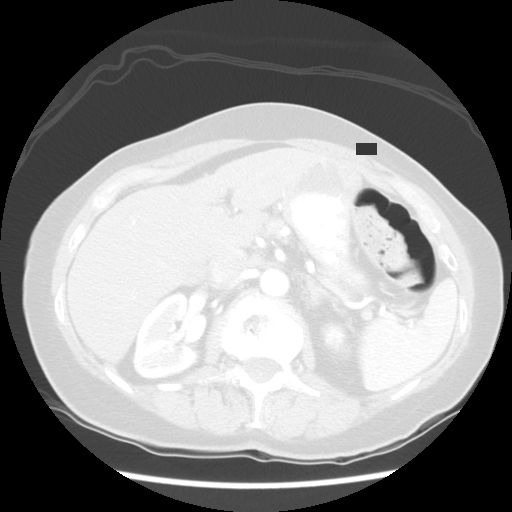
[im 120/155  soft-tissue]
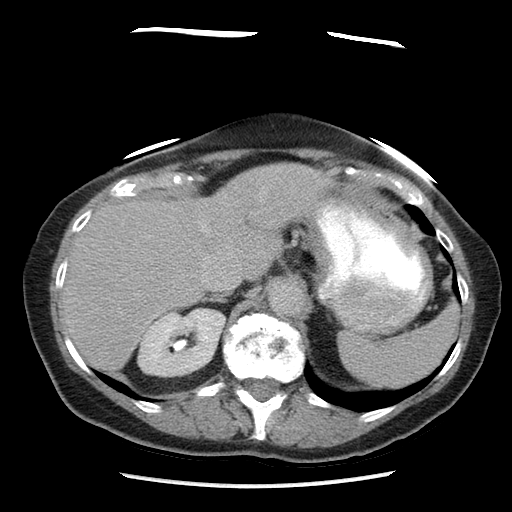
[im 120/155  lung]
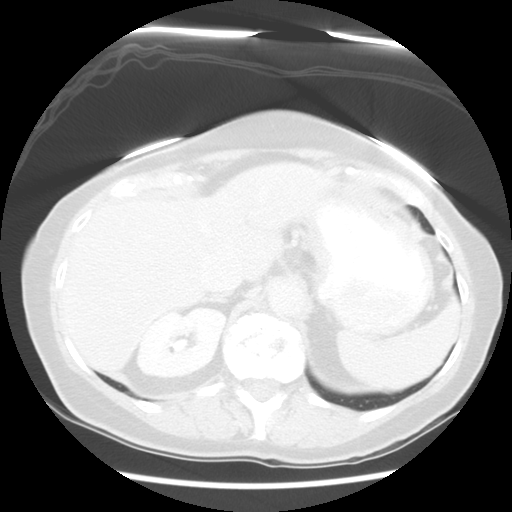
[im 137/155  soft-tissue]
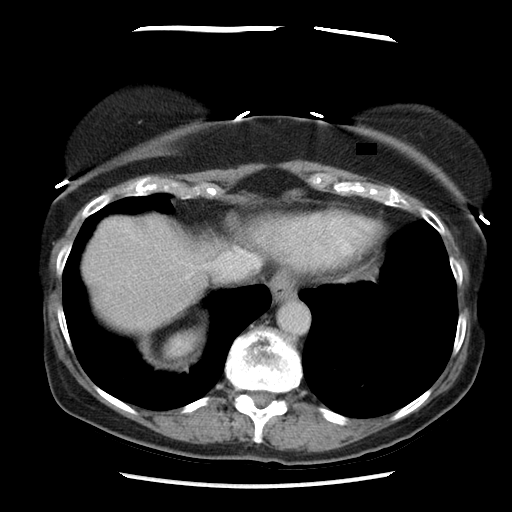
[im 137/155  lung]
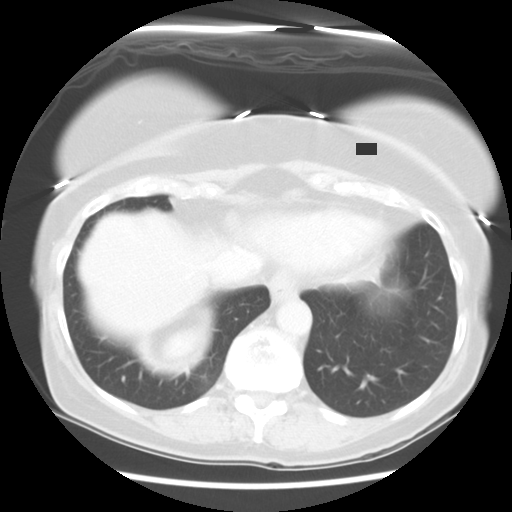

[Series 500: sagittal · sagittal · 0.82mm/px · 5 of 135 slices shown]
[im 17/135  soft-tissue]
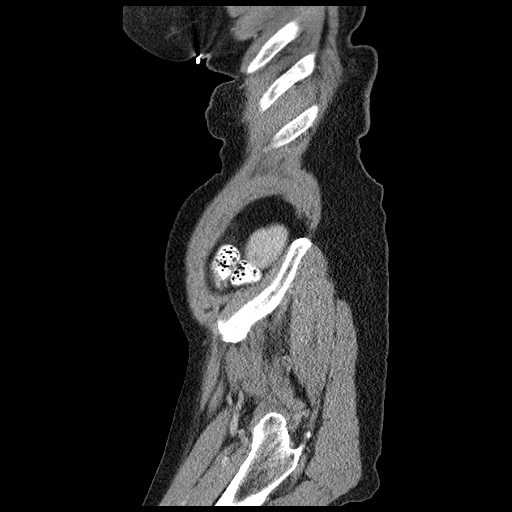
[im 34/135  soft-tissue]
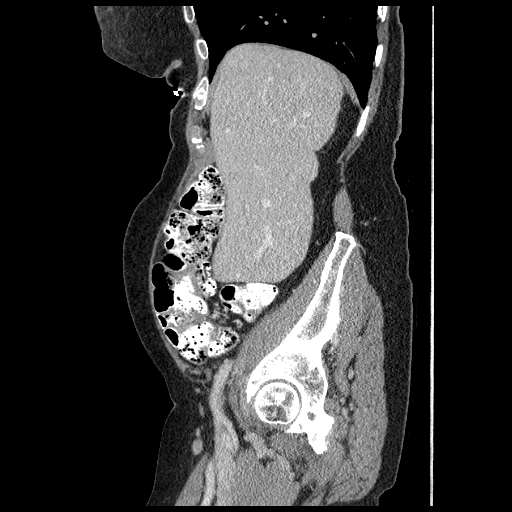
[im 51/135  soft-tissue]
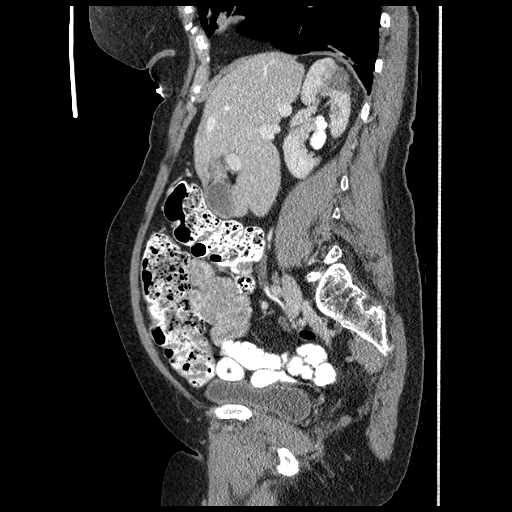
[im 68/135  soft-tissue]
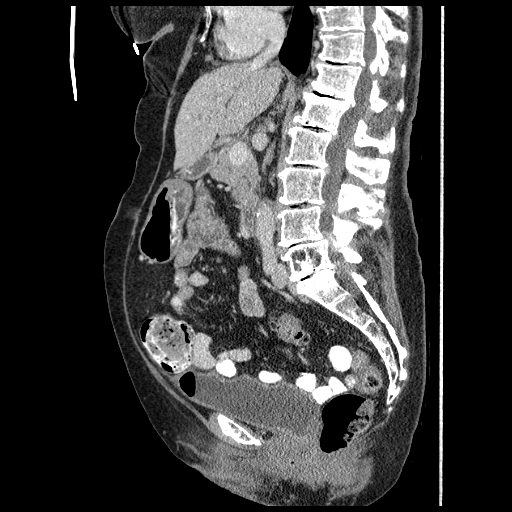
[im 84/135  soft-tissue]
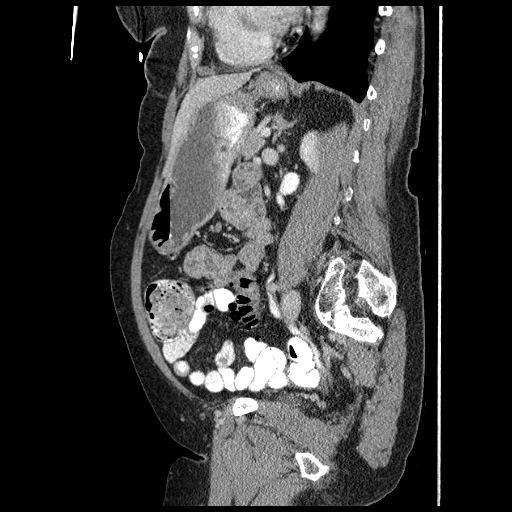

[13 of 32 positions shown; findings below may reference images not displayed]

FINDINGS: Posterior right superior kidney shows well-circumscribed
ablation defect measuring approximately 1.8 x 2.7 x 2.0 cm.  This
encompasses the treated cortical tumor and there is no evidence of
enhancement to suggest residual tumor.  No evidence of complication
at the right renal ablation site.  The right kidney shows normal
excretion of contrast on delayed sequences and no evidence of
obstruction.

The lateral left renal ablation site now demonstrates a small
amount of internal air.  The ablation defect measures approximately
1.9 x 2.5 x 2.0 cm and encompasses the cortical tumor previously
noted.  The zone of ablation again comes close to the medial margin
of the descending colon.  There is no evidence of colonic
perforation or obvious fistula.  Wall thickening of the descending
colon has diminished in prominence compared to the prior CT scan
performed 1 week after ablation.

There is a small focus of air in the upper pole collecting system
of the left kidney.  The untreated lesion in the inferior aspect of
the left kidney anteriorly measures approximately 1.4 cm in
greatest diameter.  This is stable compared to the prior MRI in
[REDACTED].

No enlarged lymph nodes.  No abnormal fluid collections.
IMPRESSION: Bilateral renal ablation sites appear to encompass the treated
tumors with no evidence of enhancing tumor present.  The left
ablation site shows a small amount of internal air and this may be
the source of pneumaturia present clinically.  The descending colon
again is very close to the site of the left renal ablation.  A
definite fistula is not demonstrated.  A tiny focus of air is
present in the collecting system of the upper pole of the left
kidney.

CT PELVIS
FINDINGS: The bladder is moderately distended during examination
and contains a small amount of nondependent air.  No evidence of
bladder wall thickening.  No free fluid or abscess in the pelvis.
Pelvic bowel loops are normal caliber.  No hernias.
IMPRESSION: Small amount of air in the bladder.

## 2009-06-01 ENCOUNTER — Ambulatory Visit: Payer: Self-pay | Admitting: Oncology

## 2009-06-05 ENCOUNTER — Inpatient Hospital Stay (HOSPITAL_COMMUNITY): Admission: EM | Admit: 2009-06-05 | Discharge: 2009-07-13 | Payer: Self-pay | Admitting: Emergency Medicine

## 2009-06-11 ENCOUNTER — Encounter (INDEPENDENT_AMBULATORY_CARE_PROVIDER_SITE_OTHER): Payer: Self-pay | Admitting: Internal Medicine

## 2009-06-12 ENCOUNTER — Ambulatory Visit: Payer: Self-pay | Admitting: Vascular Surgery

## 2009-06-12 ENCOUNTER — Encounter (INDEPENDENT_AMBULATORY_CARE_PROVIDER_SITE_OTHER): Payer: Self-pay | Admitting: Internal Medicine

## 2009-06-15 ENCOUNTER — Ambulatory Visit: Payer: Self-pay | Admitting: Internal Medicine

## 2009-06-16 ENCOUNTER — Ambulatory Visit: Payer: Self-pay | Admitting: Critical Care Medicine

## 2009-06-20 ENCOUNTER — Encounter: Payer: Self-pay | Admitting: Internal Medicine

## 2009-07-02 ENCOUNTER — Encounter (INDEPENDENT_AMBULATORY_CARE_PROVIDER_SITE_OTHER): Payer: Self-pay | Admitting: Surgery

## 2009-07-10 ENCOUNTER — Ambulatory Visit: Payer: Self-pay | Admitting: Oncology

## 2009-08-06 ENCOUNTER — Encounter: Admission: RE | Admit: 2009-08-06 | Discharge: 2009-08-06 | Payer: Self-pay | Admitting: Surgery

## 2009-08-31 ENCOUNTER — Ambulatory Visit: Payer: Self-pay | Admitting: Oncology

## 2009-10-13 HISTORY — PX: OTHER SURGICAL HISTORY: SHX169

## 2009-11-23 ENCOUNTER — Inpatient Hospital Stay (HOSPITAL_COMMUNITY): Admission: RE | Admit: 2009-11-23 | Discharge: 2009-11-26 | Payer: Self-pay | Admitting: Surgery

## 2009-12-28 ENCOUNTER — Encounter: Admission: RE | Admit: 2009-12-28 | Discharge: 2009-12-28 | Payer: Self-pay | Admitting: Surgery

## 2010-03-15 ENCOUNTER — Ambulatory Visit (HOSPITAL_COMMUNITY): Admission: RE | Admit: 2010-03-15 | Discharge: 2010-03-15 | Payer: Self-pay | Admitting: Urology

## 2010-05-10 ENCOUNTER — Encounter: Admission: RE | Admit: 2010-05-10 | Discharge: 2010-05-10 | Payer: Self-pay | Admitting: Family Medicine

## 2010-08-09 ENCOUNTER — Ambulatory Visit: Payer: Self-pay | Admitting: Oncology

## 2010-08-13 LAB — COMPREHENSIVE METABOLIC PANEL
ALT: 20 U/L (ref 0–35)
AST: 23 U/L (ref 0–37)
Alkaline Phosphatase: 89 U/L (ref 39–117)
CO2: 29 mEq/L (ref 19–32)
Sodium: 140 mEq/L (ref 135–145)
Total Bilirubin: 0.3 mg/dL (ref 0.3–1.2)
Total Protein: 6.4 g/dL (ref 6.0–8.3)

## 2010-08-13 LAB — CBC WITH DIFFERENTIAL/PLATELET
BASO%: 0.4 % (ref 0.0–2.0)
LYMPH%: 14.2 % (ref 14.0–49.7)
MCHC: 34.5 g/dL (ref 31.5–36.0)
MONO#: 0.5 10*3/uL (ref 0.1–0.9)
Platelets: 209 10*3/uL (ref 145–400)
RBC: 3.59 10*6/uL — ABNORMAL LOW (ref 3.70–5.45)
WBC: 6 10*3/uL (ref 3.9–10.3)

## 2010-08-13 LAB — CANCER ANTIGEN 27.29: CA 27.29: 14 U/mL (ref 0–39)

## 2010-09-16 ENCOUNTER — Ambulatory Visit (HOSPITAL_COMMUNITY)
Admission: RE | Admit: 2010-09-16 | Discharge: 2010-09-16 | Payer: Self-pay | Source: Home / Self Care | Admitting: Urology

## 2010-11-04 ENCOUNTER — Encounter: Payer: Self-pay | Admitting: Oncology

## 2010-11-04 ENCOUNTER — Encounter: Payer: Self-pay | Admitting: Urology

## 2010-11-13 ENCOUNTER — Ambulatory Visit (HOSPITAL_COMMUNITY)
Admission: RE | Admit: 2010-11-13 | Discharge: 2010-11-13 | Disposition: A | Discharge status: Home / Self Care | Payer: Medicare Other | Source: Ambulatory Visit | Attending: Urology | Admitting: Urology

## 2010-11-13 ENCOUNTER — Other Ambulatory Visit (HOSPITAL_COMMUNITY): Payer: PRIVATE HEALTH INSURANCE

## 2010-11-13 ENCOUNTER — Other Ambulatory Visit: Payer: Self-pay | Admitting: Urology

## 2010-11-13 ENCOUNTER — Encounter (HOSPITAL_COMMUNITY): Payer: Medicare Other

## 2010-11-13 DIAGNOSIS — Z01818 Encounter for other preprocedural examination: Secondary | ICD-10-CM

## 2010-11-13 LAB — CBC
HCT: 35.6 % — ABNORMAL LOW (ref 36.0–46.0)
MCHC: 33.1 g/dL (ref 30.0–36.0)
Platelets: 200 10*3/uL (ref 150–400)
RDW: 14.1 % (ref 11.5–15.5)
WBC: 6.5 10*3/uL (ref 4.0–10.5)

## 2010-11-13 LAB — SURGICAL PCR SCREEN
MRSA, PCR: NEGATIVE
Staphylococcus aureus: NEGATIVE

## 2010-11-13 LAB — COMPREHENSIVE METABOLIC PANEL
Alkaline Phosphatase: 84 U/L (ref 39–117)
BUN: 14 mg/dL (ref 6–23)
Creatinine, Ser: 0.93 mg/dL (ref 0.4–1.2)
Glucose, Bld: 110 mg/dL — ABNORMAL HIGH (ref 70–99)
Potassium: 3.8 mEq/L (ref 3.5–5.1)
Total Bilirubin: 0.6 mg/dL (ref 0.3–1.2)
Total Protein: 6.7 g/dL (ref 6.0–8.3)

## 2010-11-13 LAB — PROTIME-INR: INR: 1.02 (ref 0.00–1.49)

## 2010-11-25 ENCOUNTER — Other Ambulatory Visit: Payer: Self-pay | Admitting: Urology

## 2010-11-25 ENCOUNTER — Inpatient Hospital Stay (HOSPITAL_COMMUNITY)
Admission: RE | Admit: 2010-11-25 | Discharge: 2010-11-29 | DRG: 657 | Disposition: A | Discharge status: Home / Self Care | Payer: Medicare Other | Attending: Urology | Admitting: Urology

## 2010-11-25 DIAGNOSIS — K560 Paralytic ileus: Secondary | ICD-10-CM | POA: Diagnosis present

## 2010-11-25 DIAGNOSIS — K929 Disease of digestive system, unspecified: Secondary | ICD-10-CM | POA: Diagnosis present

## 2010-11-25 DIAGNOSIS — Y836 Removal of other organ (partial) (total) as the cause of abnormal reaction of the patient, or of later complication, without mention of misadventure at the time of the procedure: Secondary | ICD-10-CM | POA: Diagnosis present

## 2010-11-25 DIAGNOSIS — Z853 Personal history of malignant neoplasm of breast: Secondary | ICD-10-CM

## 2010-11-25 DIAGNOSIS — C649 Malignant neoplasm of unspecified kidney, except renal pelvis: Principal | ICD-10-CM | POA: Diagnosis present

## 2010-11-25 DIAGNOSIS — I10 Essential (primary) hypertension: Secondary | ICD-10-CM | POA: Diagnosis present

## 2010-11-25 DIAGNOSIS — M069 Rheumatoid arthritis, unspecified: Secondary | ICD-10-CM | POA: Diagnosis present

## 2010-11-25 LAB — TYPE AND SCREEN: ABO/RH(D): B POS

## 2010-11-26 LAB — BASIC METABOLIC PANEL
BUN: 8 mg/dL (ref 6–23)
CO2: 28 mEq/L (ref 19–32)
Calcium: 8.8 mg/dL (ref 8.4–10.5)
Creatinine, Ser: 0.91 mg/dL (ref 0.4–1.2)
Glucose, Bld: 164 mg/dL — ABNORMAL HIGH (ref 70–99)

## 2010-11-26 LAB — CBC
HCT: 32.3 % — ABNORMAL LOW (ref 36.0–46.0)
Hemoglobin: 10.8 g/dL — ABNORMAL LOW (ref 12.0–15.0)
MCH: 30.9 pg (ref 26.0–34.0)
MCHC: 33.4 g/dL (ref 30.0–36.0)
MCV: 92.3 fL (ref 78.0–100.0)

## 2010-11-26 LAB — DIFFERENTIAL
Lymphocytes Relative: 7 % — ABNORMAL LOW (ref 12–46)
Monocytes Absolute: 1.1 10*3/uL — ABNORMAL HIGH (ref 0.1–1.0)
Monocytes Relative: 9 % (ref 3–12)
Neutro Abs: 10.6 10*3/uL — ABNORMAL HIGH (ref 1.7–7.7)

## 2010-11-27 LAB — CBC
Hemoglobin: 11.1 g/dL — ABNORMAL LOW (ref 12.0–15.0)
MCH: 30.8 pg (ref 26.0–34.0)
MCHC: 33.6 g/dL (ref 30.0–36.0)
Platelets: 175 10*3/uL (ref 150–400)

## 2010-12-02 NOTE — Op Note (Signed)
NAMECAMBELL, RICKENBACH                ACCOUNT NO.:  [id number]  MEDICAL RECORD NO.:  [id number]           PATIENT TYPE:  O  LOCATION:  XRAY                         FACILITY:  [facility]  PHYSICIAN:  Bertram Millard. Dijon Cosens, M.D.DATE OF BIRTH:  [birth year]  DATE OF PROCEDURE:  11/25/2010 DATE OF DISCHARGE:  11/13/2010                              OPERATIVE REPORT   PREOPERATIVE DIAGNOSIS:  Left renal mass.  POSTOPERATIVE DIAGNOSIS:  Left renal mass.  SURGICAL PROCEDURE:  Open left partial nephrectomy through flank approach.  SURGEON:  Bertram Millard. Aarianna Hoadley, M.D.  FIRST ASSISTANT:  Dr. Lanier Prude.  ANESTHESIA:  General endotracheal.  COMPLICATIONS:  None.  SPECIMEN:  Left renal mass, deep margin.  ESTIMATED BLOOD LOSS:  200 mL.  BRIEF HISTORY:  [name] is a very nice 77 year old female, who presents this time for open left partial nephrectomy.  She has an anterior mass on her left lower pole that has been persistent and growing on serial abdominal cross-sectional imaging.  She is status post radiofrequency ablation of two other renal masses, one on the right and one on the left.  These were performed by Dr. Irish Lack in February 2010. 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  Unfortunately, this mass is not able to be accessed due to its anterior location.  She additionally has had prior abdominal surgery, notably a left colectomy, which precludes laparoscopic approach.  At this time, she presents for excision of this mass.  She is aware of risks and complications and desires to proceed.  DESCRIPTION OF PROCEDURE:  The patient was identified and properly marked in the holding area.  She received preoperative IV antibiotics. She was taken to the operating room where general endotracheal anesthetic was administered.  Bladder was drained with an indwelling Foley catheter.  She was placed in the left flank position.  A  beanbag and axillary roll were appropriately placed.  All extremities were padded appropriately as well.  The patient was secured to the table. Her left flank was prepped and draped.  Time-out was then called.  A 10- cm flank incision was made including the tip of the left rib in an oblique fashion with nice electrocautery, carried the incision down to the muscular and then through the muscular layers into the retroperitoneum.  The peritoneal tissue was retracted medially.  There was a very small tear in the peritoneum, which was not closed.  The kidney was easily identified through fairly thin Gerota's fascia.  The lower pole of the kidney was circumferentially dissected and skeletonized.  The 2-3 cm mass located anteriorly on the lower pole of the left kidney was easily identified.  Dissection was carried medially to the kidney and the hilum of the pedicle was identified.  The remainder of the kidney did not need to be dissected.  I suspected that there might be significant adhesions between the kidney and the colon - however, these were not visualized.  Following adequate dissection of the pedicle and placement of a bowel bag around the kidney, 12.5 gram of mannitol was administered intravenously by anesthesia.  We then packed  the kidney in saline slush.  Previous to this, I had cauterized the renal capsule about 3-4 mm lateral to the borders of the renal mass. After clamping the pedicle with a bulldog, I then used sharp and electrocautery dissection to remove the renal mass.  The dissection carried as deep into intralobular vessels and the collecting system. These were adequately identified.  Following excision of the renal mass, there was one area specifically deep into the medial side of the mass that I thought might be close to the margin of this dissection.  I sent this for frozen section.  It returned a small amount of oncocytic neoplasm.  Prior to return of the frozen section  pathology, I had closed the collecting system with a running 4-0 Vicryl.  I then also used the same PDS to apply hemostatic sutures to the vascular elements. Following placement of the sutures, there was hemostasis.  I then placed Surgicel bolster and Surgiflo to this area and secured these in place with two overlying sutures of 2-0 Vicryl, which brought the edges of the defect together.  After the bulldog clamp was removed, there was adequate hemostasis.  After return of the frozen section pathology, I had felt that there was just a very small area of positive margin and then used the argon beam coagulator to coagulate the entire base of the dissection, which theoretically got [place] another 4 mm of tissue ablation. We then replaced the Surgicel bolster and the Surgiflo, and the same overlying closing sutures of 2-0 Vicryl.  This provided excellent hemostasis as we did leave the bulldog clamp off prior to this argon beam coagulation. 12.5 grams of mannitol was again infused.   Following this, we left the drain, a 10-mm flat fully- fluted Blake drain and placed it through a separate stab incision lateral and inferior to the posterior margin of the incision.  We sutured the skin with the 3-0 nylon.  It was placed below and then anterior to the renal defect.  The transversus abdominis layer was closed with the running #1 PDS.  Right over this, the On-Q pump catheter was placed and left curled in between the transversus and the internal oblique/external oblique closure.  Following closure of these two layers together with the similar running number #1 PDS, I then placed the other On-Q pump catheter overlying this fascial closure, underneath the skin closure.  It was brought through the posterior edge of the incision and fastened with surgical glue, Steri-Strips and an OpSite.  We then used skin clips to reapproximate the skin edges in an everting fashion.  Dry sterile dressing was  placed.  Total cold ischemic time was 23 minutes.  The patient was then awakened and taken to the PACU in stable condition. She tolerated the procedure well.     Bertram Millard. Nataliah Hatlestad, M.D.     SMD/MEDQ  D:  11/25/2010  T:  11/25/2010  Job:  [id number]  cc:   Valentino Hue. Magrinat, M.D. Fax: [phone]  Duncan Dull, M.D. Fax: [phone]  Zenovia Jordan, MD Fax: [phone]  Llana Aliment. Malon Kindle., M.D. Fax: [phone]  Electronically Signed by Marcine Matar M.D. on 12/02/2010 10:42:23 AM

## 2010-12-02 NOTE — Discharge Summary (Signed)
  [name], [name]                ACCOUNT NO.:  [id number]  MEDICAL RECORD NO.:  [id number]           PATIENT TYPE:  I  LOCATION:  1445                         FACILITY:  [facility]  PHYSICIAN:  Bertram Millard. Nitisha Civello, M.D.DATE OF BIRTH:  03-05-1934  DATE OF ADMISSION:  11/25/2010 DATE OF DISCHARGE:                              DISCHARGE SUMMARY   DIAGNOSIS:  Left renal mass.  PRINCIPAL PROCEDURE:  Left partial nephrectomy on November 25, 2010.  BRIEF HISTORY:  This 77 year old female has a history of bilateral renal masses.  She has undergone radiofrequency ablation of right and left renal mass in February of 2000 by Dr. Fredia Sorrow.  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  She has had an enlarging left lower pole mass which is unable to be taken care of by thermotherapy due to its anterior location.  As she has had an enlarging mass on observation, she presents at this time fordefinitive surgical management of this.  HOSPITAL COURSE:  The patient was admitted to my service, and on the day of admission, underwent a flank approach and left partial nephrectomy. She tolerated the procedure well.  There is minimal blood loss. Postoperative course was complicated by mild ileus.  She eventually took a regular diet and had a bowel movement and was passing flatus by postoperative day #4.  At that time, pathology was not available.  She was discharged with her drain out, and the On-Q pump was removed on the day of discharge.  She will follow up in my office in approximately 1 week for staple removal.  DISCHARGE MEDICATIONS:  At the time of discharge, medications included: 1. Xanax 0.25 mg p.o. q.d. p.r.n. 2. Guaifenesin 600 mg p.o. b.i.d. 3. MiraLax 8.5 gm daily. 4. Ambien 2.5 mg p.o. p.r.n. h.s. 5. K-Dur 20 mEq p.o. daily. 6. Triamterene/HCTZ 37.5/25 mg p.o. q.d. 7. Folic acid 1 mg daily 8. Zocor 20 mg p.o. daily. 9. Levothyroxine 175 mcg  daily. 10.Tylenol No. 3 one to two p.o. q.4h. p.r.n. pain. 11.Methotrexate 2.5 mg 4 tablets p.o. for 7 days. 12.Omeprazole 20 mg daily. 13.Naprosyn 500 mg p.o. q.a.m.     Bertram Millard. Cordia Miklos, M.D.     SMD/MEDQ  D:  11/29/2010  T:  11/29/2010  Job:  [id number]  cc:   Valentino Hue. Magrinat, M.D. Fax: [phone]  Llana Aliment. Malon Kindle., M.D. Fax: [phone]  Dr. Shelly Coss Rheumat[facility]  Electronically Signed by Marcine Matar M.D. on 12/02/2010 10:42:41 AM

## 2010-12-12 DEATH — deceased

## 2011-01-01 LAB — CBC
HCT: 32.4 % — ABNORMAL LOW (ref 36.0–46.0)
Hemoglobin: 10.1 g/dL — ABNORMAL LOW (ref 12.0–15.0)
Hemoglobin: 11.1 g/dL — ABNORMAL LOW (ref 12.0–15.0)
MCV: 94.5 fL (ref 78.0–100.0)
MCV: 95.5 fL (ref 78.0–100.0)
Platelets: 179 10*3/uL (ref 150–400)
RBC: 3.16 MIL/uL — ABNORMAL LOW (ref 3.87–5.11)
RBC: 3.43 MIL/uL — ABNORMAL LOW (ref 3.87–5.11)
RDW: 18.2 % — ABNORMAL HIGH (ref 11.5–15.5)
RDW: 18.3 % — ABNORMAL HIGH (ref 11.5–15.5)
WBC: 5.1 10*3/uL (ref 4.0–10.5)
WBC: 6.9 10*3/uL (ref 4.0–10.5)

## 2011-01-01 LAB — COMPREHENSIVE METABOLIC PANEL
Alkaline Phosphatase: 72 U/L (ref 39–117)
BUN: 15 mg/dL (ref 6–23)
CO2: 32 mEq/L (ref 19–32)
Chloride: 104 mEq/L (ref 96–112)
Creatinine, Ser: 0.91 mg/dL (ref 0.4–1.2)
GFR calc non Af Amer: >60 mL/min (ref >60)
Glucose, Bld: 110 mg/dL — ABNORMAL HIGH (ref 70–99)
Total Bilirubin: 0.5 mg/dL (ref 0.3–1.2)

## 2011-01-01 LAB — POTASSIUM
Potassium: 4 mEq/L (ref 3.5–5.1)
Potassium: 4.1 mEq/L (ref 3.5–5.1)

## 2011-01-01 LAB — CREATININE, SERUM
GFR calc Af Amer: >60 mL/min (ref >60)
GFR calc Af Amer: >60 mL/min (ref >60)
GFR calc non Af Amer: 57 mL/min — ABNORMAL LOW (ref >60)

## 2011-01-01 LAB — CK: Total CK: 63 U/L (ref 7–177)

## 2011-01-15 ENCOUNTER — Other Ambulatory Visit: Payer: Self-pay | Admitting: Gastroenterology

## 2011-01-15 DIAGNOSIS — R1030 Lower abdominal pain, unspecified: Secondary | ICD-10-CM

## 2011-01-16 LAB — PROTIME-INR
INR: 1.2 (ref 0.00–1.49)
Prothrombin Time: 14.8 seconds (ref 11.6–15.2)

## 2011-01-16 LAB — BASIC METABOLIC PANEL
BUN: 11 mg/dL (ref 6–23)
Calcium: 8.6 mg/dL (ref 8.4–10.5)
Chloride: 106 mEq/L (ref 96–112)
Creatinine, Ser: 0.85 mg/dL (ref 0.4–1.2)
GFR calc Af Amer: >60 mL/min (ref >60)
GFR calc non Af Amer: >60 mL/min (ref >60)

## 2011-01-16 LAB — CBC
MCV: 93.6 fL (ref 78.0–100.0)
Platelets: 269 10*3/uL (ref 150–400)
RBC: 2.75 MIL/uL — ABNORMAL LOW (ref 3.87–5.11)
WBC: 7.3 10*3/uL (ref 4.0–10.5)

## 2011-01-17 LAB — CBC
HCT: 23.5 % — ABNORMAL LOW (ref 36.0–46.0)
HCT: 24 % — ABNORMAL LOW (ref 36.0–46.0)
HCT: 24.7 % — ABNORMAL LOW (ref 36.0–46.0)
HCT: 26 % — ABNORMAL LOW (ref 36.0–46.0)
HCT: 27 % — ABNORMAL LOW (ref 36.0–46.0)
HCT: 27.4 % — ABNORMAL LOW (ref 36.0–46.0)
HCT: 27.9 % — ABNORMAL LOW (ref 36.0–46.0)
HCT: 28.8 % — ABNORMAL LOW (ref 36.0–46.0)
HCT: 28.9 % — ABNORMAL LOW (ref 36.0–46.0)
HCT: 29.6 % — ABNORMAL LOW (ref 36.0–46.0)
HCT: 26.4 % — ABNORMAL LOW (ref 36.0–46.0)
HCT: 26.9 % — ABNORMAL LOW (ref 36.0–46.0)
HCT: 28 % — ABNORMAL LOW (ref 36.0–46.0)
HCT: 29.8 % — ABNORMAL LOW (ref 36.0–46.0)
HCT: 30 % — ABNORMAL LOW (ref 36.0–46.0)
HCT: 30.1 % — ABNORMAL LOW (ref 36.0–46.0)
HCT: 30.4 % — ABNORMAL LOW (ref 36.0–46.0)
HCT: 32.3 % — ABNORMAL LOW (ref 36.0–46.0)
HCT: 33.4 % — ABNORMAL LOW (ref 36.0–46.0)
HCT: 35.6 % — ABNORMAL LOW (ref 36.0–46.0)
Hemoglobin: 7.9 g/dL — CL (ref 12.0–15.0)
Hemoglobin: 8.3 g/dL — ABNORMAL LOW (ref 12.0–15.0)
Hemoglobin: 8.7 g/dL — ABNORMAL LOW (ref 12.0–15.0)
Hemoglobin: 8.9 g/dL — ABNORMAL LOW (ref 12.0–15.0)
Hemoglobin: 9 g/dL — ABNORMAL LOW (ref 12.0–15.0)
Hemoglobin: 9.1 g/dL — ABNORMAL LOW (ref 12.0–15.0)
Hemoglobin: 10.6 g/dL — ABNORMAL LOW (ref 12.0–15.0)
Hemoglobin: 10.9 g/dL — ABNORMAL LOW (ref 12.0–15.0)
Hemoglobin: 8.6 g/dL — ABNORMAL LOW (ref 12.0–15.0)
Hemoglobin: 8.8 g/dL — ABNORMAL LOW (ref 12.0–15.0)
Hemoglobin: 9.1 g/dL — ABNORMAL LOW (ref 12.0–15.0)
Hemoglobin: 9.4 g/dL — ABNORMAL LOW (ref 12.0–15.0)
Hemoglobin: 9.8 g/dL — ABNORMAL LOW (ref 12.0–15.0)
Hemoglobin: 9.9 g/dL — ABNORMAL LOW (ref 12.0–15.0)
MCHC: 32.9 g/dL (ref 30.0–36.0)
MCHC: 33 g/dL (ref 30.0–36.0)
MCHC: 33 g/dL (ref 30.0–36.0)
MCHC: 33.1 g/dL (ref 30.0–36.0)
MCHC: 33.1 g/dL (ref 30.0–36.0)
MCHC: 33.2 g/dL (ref 30.0–36.0)
MCHC: 33.3 g/dL (ref 30.0–36.0)
MCHC: 33.3 g/dL (ref 30.0–36.0)
MCHC: 32.7 g/dL (ref 30.0–36.0)
MCHC: 32.9 g/dL (ref 30.0–36.0)
MCHC: 32.9 g/dL (ref 30.0–36.0)
MCHC: 33 g/dL (ref 30.0–36.0)
MCHC: 33 g/dL (ref 30.0–36.0)
MCHC: 33.1 g/dL (ref 30.0–36.0)
MCHC: 33.2 g/dL (ref 30.0–36.0)
MCHC: 33.2 g/dL (ref 30.0–36.0)
MCHC: 33.3 g/dL (ref 30.0–36.0)
MCHC: 33.5 g/dL (ref 30.0–36.0)
MCHC: 33.7 g/dL (ref 30.0–36.0)
MCV: 93.4 fL (ref 78.0–100.0)
MCV: 93.5 fL (ref 78.0–100.0)
MCV: 93.6 fL (ref 78.0–100.0)
MCV: 93.7 fL (ref 78.0–100.0)
MCV: 94.6 fL (ref 78.0–100.0)
MCV: 94.8 fL (ref 78.0–100.0)
MCV: 95.4 fL (ref 78.0–100.0)
MCV: 95.7 fL (ref 78.0–100.0)
MCV: 95.8 fL (ref 78.0–100.0)
MCV: 94.6 fL (ref 78.0–100.0)
MCV: 95.1 fL (ref 78.0–100.0)
MCV: 95.6 fL (ref 78.0–100.0)
MCV: 96 fL (ref 78.0–100.0)
MCV: 96.1 fL (ref 78.0–100.0)
MCV: 96.3 fL (ref 78.0–100.0)
MCV: 96.4 fL (ref 78.0–100.0)
MCV: 96.6 fL (ref 78.0–100.0)
MCV: 96.7 fL (ref 78.0–100.0)
MCV: 96.7 fL (ref 78.0–100.0)
MCV: 97.8 fL (ref 78.0–100.0)
Platelets: 215 10*3/uL (ref 150–400)
Platelets: 253 10*3/uL (ref 150–400)
Platelets: 259 10*3/uL (ref 150–400)
Platelets: 271 10*3/uL (ref 150–400)
Platelets: 318 10*3/uL (ref 150–400)
Platelets: 320 10*3/uL (ref 150–400)
Platelets: 351 10*3/uL (ref 150–400)
Platelets: 354 10*3/uL (ref 150–400)
Platelets: 374 10*3/uL (ref 150–400)
Platelets: 257 10*3/uL (ref 150–400)
Platelets: 298 10*3/uL (ref 150–400)
Platelets: 306 10*3/uL (ref 150–400)
Platelets: 330 10*3/uL (ref 150–400)
Platelets: 331 10*3/uL (ref 150–400)
Platelets: 366 10*3/uL (ref 150–400)
RBC: 2.57 MIL/uL — ABNORMAL LOW (ref 3.87–5.11)
RBC: 2.94 MIL/uL — ABNORMAL LOW (ref 3.87–5.11)
RBC: 2.73 MIL/uL — ABNORMAL LOW (ref 3.87–5.11)
RBC: 2.74 MIL/uL — ABNORMAL LOW (ref 3.87–5.11)
RBC: 2.81 MIL/uL — ABNORMAL LOW (ref 3.87–5.11)
RBC: 2.83 MIL/uL — ABNORMAL LOW (ref 3.87–5.11)
RBC: 3.04 MIL/uL — ABNORMAL LOW (ref 3.87–5.11)
RBC: 3.09 MIL/uL — ABNORMAL LOW (ref 3.87–5.11)
RBC: 3.1 MIL/uL — ABNORMAL LOW (ref 3.87–5.11)
RBC: 3.15 MIL/uL — ABNORMAL LOW (ref 3.87–5.11)
RBC: 3.17 MIL/uL — ABNORMAL LOW (ref 3.87–5.11)
RBC: 3.33 MIL/uL — ABNORMAL LOW (ref 3.87–5.11)
RBC: 3.34 MIL/uL — ABNORMAL LOW (ref 3.87–5.11)
RBC: 3.43 MIL/uL — ABNORMAL LOW (ref 3.87–5.11)
RBC: 3.53 MIL/uL — ABNORMAL LOW (ref 3.87–5.11)
RBC: 3.64 MIL/uL — ABNORMAL LOW (ref 3.87–5.11)
RDW: 15.4 % (ref 11.5–15.5)
RDW: 15.5 % (ref 11.5–15.5)
RDW: 15.7 % — ABNORMAL HIGH (ref 11.5–15.5)
RDW: 15.8 % — ABNORMAL HIGH (ref 11.5–15.5)
RDW: 15.8 % — ABNORMAL HIGH (ref 11.5–15.5)
RDW: 15.8 % — ABNORMAL HIGH (ref 11.5–15.5)
RDW: 15.9 % — ABNORMAL HIGH (ref 11.5–15.5)
RDW: 16.3 % — ABNORMAL HIGH (ref 11.5–15.5)
RDW: 16.4 % — ABNORMAL HIGH (ref 11.5–15.5)
RDW: 16.4 % — ABNORMAL HIGH (ref 11.5–15.5)
RDW: 16.5 % — ABNORMAL HIGH (ref 11.5–15.5)
RDW: 16.6 % — ABNORMAL HIGH (ref 11.5–15.5)
RDW: 15.6 % — ABNORMAL HIGH (ref 11.5–15.5)
RDW: 15.8 % — ABNORMAL HIGH (ref 11.5–15.5)
RDW: 16.4 % — ABNORMAL HIGH (ref 11.5–15.5)
RDW: 16.5 % — ABNORMAL HIGH (ref 11.5–15.5)
RDW: 16.9 % — ABNORMAL HIGH (ref 11.5–15.5)
WBC: 12.6 10*3/uL — ABNORMAL HIGH (ref 4.0–10.5)
WBC: 17.2 10*3/uL — ABNORMAL HIGH (ref 4.0–10.5)
WBC: 18.6 10*3/uL — ABNORMAL HIGH (ref 4.0–10.5)
WBC: 8.3 10*3/uL (ref 4.0–10.5)
WBC: 11 10*3/uL — ABNORMAL HIGH (ref 4.0–10.5)
WBC: 11 10*3/uL — ABNORMAL HIGH (ref 4.0–10.5)
WBC: 13.2 10*3/uL — ABNORMAL HIGH (ref 4.0–10.5)
WBC: 18.7 10*3/uL — ABNORMAL HIGH (ref 4.0–10.5)
WBC: 21.8 10*3/uL — ABNORMAL HIGH (ref 4.0–10.5)
WBC: 7.8 10*3/uL (ref 4.0–10.5)
WBC: 7.8 10*3/uL (ref 4.0–10.5)
WBC: 9.3 10*3/uL (ref 4.0–10.5)

## 2011-01-17 LAB — BASIC METABOLIC PANEL
BUN: 16 mg/dL (ref 6–23)
BUN: 19 mg/dL (ref 6–23)
BUN: 20 mg/dL (ref 6–23)
BUN: 23 mg/dL (ref 6–23)
BUN: 13 mg/dL (ref 6–23)
CO2: 24 mEq/L (ref 19–32)
CO2: 25 mEq/L (ref 19–32)
CO2: 26 mEq/L (ref 19–32)
CO2: 28 mEq/L (ref 19–32)
CO2: 28 mEq/L (ref 19–32)
CO2: 28 mEq/L (ref 19–32)
CO2: 28 mEq/L (ref 19–32)
CO2: 29 mEq/L (ref 19–32)
CO2: 29 mEq/L (ref 19–32)
Calcium: 7.6 mg/dL — ABNORMAL LOW (ref 8.4–10.5)
Calcium: 8.6 mg/dL (ref 8.4–10.5)
Calcium: 8 mg/dL — ABNORMAL LOW (ref 8.4–10.5)
Chloride: 104 mEq/L (ref 96–112)
Chloride: 107 mEq/L (ref 96–112)
Chloride: 108 mEq/L (ref 96–112)
Chloride: 110 mEq/L (ref 96–112)
Chloride: 104 mEq/L (ref 96–112)
Chloride: 104 mEq/L (ref 96–112)
Creatinine, Ser: 0.74 mg/dL (ref 0.4–1.2)
Creatinine, Ser: 0.8 mg/dL (ref 0.4–1.2)
Creatinine, Ser: 0.94 mg/dL (ref 0.4–1.2)
GFR calc Af Amer: >60 mL/min (ref >60)
GFR calc Af Amer: >60 mL/min (ref >60)
GFR calc Af Amer: >60 mL/min (ref >60)
GFR calc non Af Amer: 58 mL/min — ABNORMAL LOW (ref >60)
GFR calc non Af Amer: >60 mL/min (ref >60)
GFR calc non Af Amer: >60 mL/min (ref >60)
Glucose, Bld: 117 mg/dL — ABNORMAL HIGH (ref 70–99)
Glucose, Bld: 129 mg/dL — ABNORMAL HIGH (ref 70–99)
Glucose, Bld: 131 mg/dL — ABNORMAL HIGH (ref 70–99)
Glucose, Bld: 275 mg/dL — ABNORMAL HIGH (ref 70–99)
Glucose, Bld: 90 mg/dL (ref 70–99)
Glucose, Bld: 129 mg/dL — ABNORMAL HIGH (ref 70–99)
Glucose, Bld: 91 mg/dL (ref 70–99)
Potassium: 3.9 mEq/L (ref 3.5–5.1)
Potassium: 4 mEq/L (ref 3.5–5.1)
Potassium: 4.3 mEq/L (ref 3.5–5.1)
Potassium: 3.4 mEq/L — ABNORMAL LOW (ref 3.5–5.1)
Potassium: 3.4 mEq/L — ABNORMAL LOW (ref 3.5–5.1)
Potassium: 3.6 mEq/L (ref 3.5–5.1)
Sodium: 139 mEq/L (ref 135–145)
Sodium: 139 mEq/L (ref 135–145)
Sodium: 140 mEq/L (ref 135–145)
Sodium: 137 mEq/L (ref 135–145)
Sodium: 140 mEq/L (ref 135–145)

## 2011-01-17 LAB — COMPREHENSIVE METABOLIC PANEL
ALT: 12 U/L (ref 0–35)
ALT: 19 U/L (ref 0–35)
ALT: 20 U/L (ref 0–35)
ALT: 16 U/L (ref 0–35)
ALT: 18 U/L (ref 0–35)
AST: 12 U/L (ref 0–37)
AST: 18 U/L (ref 0–37)
AST: 19 U/L (ref 0–37)
AST: 18 U/L (ref 0–37)
AST: 28 U/L (ref 0–37)
Albumin: 1.8 g/dL — ABNORMAL LOW (ref 3.5–5.2)
Albumin: 2.1 g/dL — ABNORMAL LOW (ref 3.5–5.2)
Albumin: 2.2 g/dL — ABNORMAL LOW (ref 3.5–5.2)
Albumin: 2.3 g/dL — ABNORMAL LOW (ref 3.5–5.2)
Albumin: 2.3 g/dL — ABNORMAL LOW (ref 3.5–5.2)
Alkaline Phosphatase: 47 U/L (ref 39–117)
Alkaline Phosphatase: 67 U/L (ref 39–117)
Alkaline Phosphatase: 72 U/L (ref 39–117)
Alkaline Phosphatase: 45 U/L (ref 39–117)
Alkaline Phosphatase: 62 U/L (ref 39–117)
BUN: 17 mg/dL (ref 6–23)
BUN: 18 mg/dL (ref 6–23)
BUN: 19 mg/dL (ref 6–23)
BUN: 12 mg/dL (ref 6–23)
BUN: 12 mg/dL (ref 6–23)
CO2: 29 mEq/L (ref 19–32)
CO2: 25 mEq/L (ref 19–32)
CO2: 27 mEq/L (ref 19–32)
CO2: 28 mEq/L (ref 19–32)
CO2: 33 mEq/L — ABNORMAL HIGH (ref 19–32)
Calcium: 8.5 mg/dL (ref 8.4–10.5)
Calcium: 8.8 mg/dL (ref 8.4–10.5)
Calcium: 8.8 mg/dL (ref 8.4–10.5)
Calcium: 9 mg/dL (ref 8.4–10.5)
Chloride: 104 mEq/L (ref 96–112)
Chloride: 107 mEq/L (ref 96–112)
Chloride: 110 mEq/L (ref 96–112)
Chloride: 102 mEq/L (ref 96–112)
Chloride: 105 mEq/L (ref 96–112)
Chloride: 111 mEq/L (ref 96–112)
Creatinine, Ser: 0.9 mg/dL (ref 0.4–1.2)
Creatinine, Ser: 0.76 mg/dL (ref 0.4–1.2)
Creatinine, Ser: 0.79 mg/dL (ref 0.4–1.2)
Creatinine, Ser: 0.8 mg/dL (ref 0.4–1.2)
GFR calc Af Amer: >60 mL/min (ref >60)
GFR calc Af Amer: >60 mL/min (ref >60)
GFR calc Af Amer: >60 mL/min (ref >60)
GFR calc Af Amer: >60 mL/min (ref >60)
GFR calc Af Amer: >60 mL/min (ref >60)
GFR calc Af Amer: >60 mL/min (ref >60)
GFR calc non Af Amer: >60 mL/min (ref >60)
GFR calc non Af Amer: >60 mL/min (ref >60)
GFR calc non Af Amer: >60 mL/min (ref >60)
GFR calc non Af Amer: >60 mL/min (ref >60)
GFR calc non Af Amer: >60 mL/min (ref >60)
Glucose, Bld: 108 mg/dL — ABNORMAL HIGH (ref 70–99)
Glucose, Bld: 120 mg/dL — ABNORMAL HIGH (ref 70–99)
Glucose, Bld: 88 mg/dL (ref 70–99)
Glucose, Bld: 136 mg/dL — ABNORMAL HIGH (ref 70–99)
Glucose, Bld: 137 mg/dL — ABNORMAL HIGH (ref 70–99)
Glucose, Bld: 99 mg/dL (ref 70–99)
Potassium: 3.7 mEq/L (ref 3.5–5.1)
Potassium: 3.7 mEq/L (ref 3.5–5.1)
Potassium: 3.9 mEq/L (ref 3.5–5.1)
Potassium: 3.2 mEq/L — ABNORMAL LOW (ref 3.5–5.1)
Potassium: 3.9 mEq/L (ref 3.5–5.1)
Sodium: 139 mEq/L (ref 135–145)
Sodium: 140 mEq/L (ref 135–145)
Sodium: 143 mEq/L (ref 135–145)
Sodium: 138 mEq/L (ref 135–145)
Sodium: 138 mEq/L (ref 135–145)
Total Bilirubin: 0.4 mg/dL (ref 0.3–1.2)
Total Bilirubin: 0.5 mg/dL (ref 0.3–1.2)
Total Bilirubin: 0.3 mg/dL (ref 0.3–1.2)
Total Bilirubin: 0.4 mg/dL (ref 0.3–1.2)
Total Bilirubin: 0.5 mg/dL (ref 0.3–1.2)
Total Protein: 4.8 g/dL — ABNORMAL LOW (ref 6.0–8.3)
Total Protein: 4.9 g/dL — ABNORMAL LOW (ref 6.0–8.3)
Total Protein: 5.4 g/dL — ABNORMAL LOW (ref 6.0–8.3)
Total Protein: 5.4 g/dL — ABNORMAL LOW (ref 6.0–8.3)
Total Protein: 5.1 g/dL — ABNORMAL LOW (ref 6.0–8.3)

## 2011-01-17 LAB — GLUCOSE, CAPILLARY
Glucose-Capillary: 100 mg/dL — ABNORMAL HIGH (ref 70–99)
Glucose-Capillary: 112 mg/dL — ABNORMAL HIGH (ref 70–99)
Glucose-Capillary: 116 mg/dL — ABNORMAL HIGH (ref 70–99)
Glucose-Capillary: 118 mg/dL — ABNORMAL HIGH (ref 70–99)
Glucose-Capillary: 121 mg/dL — ABNORMAL HIGH (ref 70–99)
Glucose-Capillary: 123 mg/dL — ABNORMAL HIGH (ref 70–99)
Glucose-Capillary: 124 mg/dL — ABNORMAL HIGH (ref 70–99)
Glucose-Capillary: 125 mg/dL — ABNORMAL HIGH (ref 70–99)
Glucose-Capillary: 127 mg/dL — ABNORMAL HIGH (ref 70–99)
Glucose-Capillary: 127 mg/dL — ABNORMAL HIGH (ref 70–99)
Glucose-Capillary: 130 mg/dL — ABNORMAL HIGH (ref 70–99)
Glucose-Capillary: 131 mg/dL — ABNORMAL HIGH (ref 70–99)
Glucose-Capillary: 134 mg/dL — ABNORMAL HIGH (ref 70–99)
Glucose-Capillary: 135 mg/dL — ABNORMAL HIGH (ref 70–99)
Glucose-Capillary: 136 mg/dL — ABNORMAL HIGH (ref 70–99)
Glucose-Capillary: 137 mg/dL — ABNORMAL HIGH (ref 70–99)
Glucose-Capillary: 137 mg/dL — ABNORMAL HIGH (ref 70–99)
Glucose-Capillary: 139 mg/dL — ABNORMAL HIGH (ref 70–99)
Glucose-Capillary: 140 mg/dL — ABNORMAL HIGH (ref 70–99)
Glucose-Capillary: 140 mg/dL — ABNORMAL HIGH (ref 70–99)
Glucose-Capillary: 143 mg/dL — ABNORMAL HIGH (ref 70–99)
Glucose-Capillary: 145 mg/dL — ABNORMAL HIGH (ref 70–99)
Glucose-Capillary: 146 mg/dL — ABNORMAL HIGH (ref 70–99)
Glucose-Capillary: 148 mg/dL — ABNORMAL HIGH (ref 70–99)
Glucose-Capillary: 150 mg/dL — ABNORMAL HIGH (ref 70–99)
Glucose-Capillary: 151 mg/dL — ABNORMAL HIGH (ref 70–99)
Glucose-Capillary: 151 mg/dL — ABNORMAL HIGH (ref 70–99)
Glucose-Capillary: 152 mg/dL — ABNORMAL HIGH (ref 70–99)
Glucose-Capillary: 158 mg/dL — ABNORMAL HIGH (ref 70–99)
Glucose-Capillary: 160 mg/dL — ABNORMAL HIGH (ref 70–99)
Glucose-Capillary: 161 mg/dL — ABNORMAL HIGH (ref 70–99)
Glucose-Capillary: 161 mg/dL — ABNORMAL HIGH (ref 70–99)
Glucose-Capillary: 166 mg/dL — ABNORMAL HIGH (ref 70–99)
Glucose-Capillary: 173 mg/dL — ABNORMAL HIGH (ref 70–99)
Glucose-Capillary: 183 mg/dL — ABNORMAL HIGH (ref 70–99)
Glucose-Capillary: 211 mg/dL — ABNORMAL HIGH (ref 70–99)
Glucose-Capillary: 227 mg/dL — ABNORMAL HIGH (ref 70–99)
Glucose-Capillary: 278 mg/dL — ABNORMAL HIGH (ref 70–99)
Glucose-Capillary: 92 mg/dL (ref 70–99)
Glucose-Capillary: 96 mg/dL (ref 70–99)
Glucose-Capillary: 110 mg/dL — ABNORMAL HIGH (ref 70–99)
Glucose-Capillary: 113 mg/dL — ABNORMAL HIGH (ref 70–99)
Glucose-Capillary: 116 mg/dL — ABNORMAL HIGH (ref 70–99)
Glucose-Capillary: 119 mg/dL — ABNORMAL HIGH (ref 70–99)
Glucose-Capillary: 121 mg/dL — ABNORMAL HIGH (ref 70–99)
Glucose-Capillary: 122 mg/dL — ABNORMAL HIGH (ref 70–99)
Glucose-Capillary: 122 mg/dL — ABNORMAL HIGH (ref 70–99)
Glucose-Capillary: 123 mg/dL — ABNORMAL HIGH (ref 70–99)
Glucose-Capillary: 123 mg/dL — ABNORMAL HIGH (ref 70–99)
Glucose-Capillary: 124 mg/dL — ABNORMAL HIGH (ref 70–99)
Glucose-Capillary: 124 mg/dL — ABNORMAL HIGH (ref 70–99)
Glucose-Capillary: 124 mg/dL — ABNORMAL HIGH (ref 70–99)
Glucose-Capillary: 125 mg/dL — ABNORMAL HIGH (ref 70–99)
Glucose-Capillary: 126 mg/dL — ABNORMAL HIGH (ref 70–99)
Glucose-Capillary: 128 mg/dL — ABNORMAL HIGH (ref 70–99)
Glucose-Capillary: 130 mg/dL — ABNORMAL HIGH (ref 70–99)
Glucose-Capillary: 130 mg/dL — ABNORMAL HIGH (ref 70–99)
Glucose-Capillary: 131 mg/dL — ABNORMAL HIGH (ref 70–99)
Glucose-Capillary: 134 mg/dL — ABNORMAL HIGH (ref 70–99)
Glucose-Capillary: 134 mg/dL — ABNORMAL HIGH (ref 70–99)
Glucose-Capillary: 136 mg/dL — ABNORMAL HIGH (ref 70–99)
Glucose-Capillary: 137 mg/dL — ABNORMAL HIGH (ref 70–99)
Glucose-Capillary: 138 mg/dL — ABNORMAL HIGH (ref 70–99)
Glucose-Capillary: 138 mg/dL — ABNORMAL HIGH (ref 70–99)
Glucose-Capillary: 138 mg/dL — ABNORMAL HIGH (ref 70–99)
Glucose-Capillary: 138 mg/dL — ABNORMAL HIGH (ref 70–99)
Glucose-Capillary: 139 mg/dL — ABNORMAL HIGH (ref 70–99)
Glucose-Capillary: 140 mg/dL — ABNORMAL HIGH (ref 70–99)
Glucose-Capillary: 141 mg/dL — ABNORMAL HIGH (ref 70–99)
Glucose-Capillary: 142 mg/dL — ABNORMAL HIGH (ref 70–99)
Glucose-Capillary: 146 mg/dL — ABNORMAL HIGH (ref 70–99)
Glucose-Capillary: 149 mg/dL — ABNORMAL HIGH (ref 70–99)
Glucose-Capillary: 150 mg/dL — ABNORMAL HIGH (ref 70–99)
Glucose-Capillary: 150 mg/dL — ABNORMAL HIGH (ref 70–99)
Glucose-Capillary: 151 mg/dL — ABNORMAL HIGH (ref 70–99)
Glucose-Capillary: 152 mg/dL — ABNORMAL HIGH (ref 70–99)
Glucose-Capillary: 159 mg/dL — ABNORMAL HIGH (ref 70–99)
Glucose-Capillary: 159 mg/dL — ABNORMAL HIGH (ref 70–99)
Glucose-Capillary: 161 mg/dL — ABNORMAL HIGH (ref 70–99)
Glucose-Capillary: 162 mg/dL — ABNORMAL HIGH (ref 70–99)
Glucose-Capillary: 167 mg/dL — ABNORMAL HIGH (ref 70–99)
Glucose-Capillary: 169 mg/dL — ABNORMAL HIGH (ref 70–99)
Glucose-Capillary: 177 mg/dL — ABNORMAL HIGH (ref 70–99)
Glucose-Capillary: 179 mg/dL — ABNORMAL HIGH (ref 70–99)
Glucose-Capillary: 196 mg/dL — ABNORMAL HIGH (ref 70–99)
Glucose-Capillary: 197 mg/dL — ABNORMAL HIGH (ref 70–99)

## 2011-01-17 LAB — MAGNESIUM
Magnesium: 1.9 mg/dL (ref 1.5–2.5)
Magnesium: 2.1 mg/dL (ref 1.5–2.5)
Magnesium: 2.1 mg/dL (ref 1.5–2.5)
Magnesium: 2.2 mg/dL (ref 1.5–2.5)
Magnesium: 1.9 mg/dL (ref 1.5–2.5)
Magnesium: 2 mg/dL (ref 1.5–2.5)
Magnesium: 2.1 mg/dL (ref 1.5–2.5)
Magnesium: 2.1 mg/dL (ref 1.5–2.5)
Magnesium: 2.3 mg/dL (ref 1.5–2.5)

## 2011-01-17 LAB — PHOSPHORUS
Phosphorus: 2.9 mg/dL (ref 2.3–4.6)
Phosphorus: 3.1 mg/dL (ref 2.3–4.6)
Phosphorus: 3.2 mg/dL (ref 2.3–4.6)
Phosphorus: 3.6 mg/dL (ref 2.3–4.6)
Phosphorus: 3.3 mg/dL (ref 2.3–4.6)
Phosphorus: 3.3 mg/dL (ref 2.3–4.6)

## 2011-01-17 LAB — PROTIME-INR
INR: 1.1 (ref 0.00–1.49)
INR: 1.1 (ref 0.00–1.49)
INR: 1.1 (ref 0.00–1.49)
INR: 1.1 (ref 0.00–1.49)
INR: 1.2 (ref 0.00–1.49)
INR: 1.3 (ref 0.00–1.49)
INR: 1.4 (ref 0.00–1.49)
INR: 1 (ref 0.00–1.49)
INR: 1.1 (ref 0.00–1.49)
INR: 1.1 (ref 0.00–1.49)
INR: 1.1 (ref 0.00–1.49)
INR: 1.2 (ref 0.00–1.49)
INR: 1.3 (ref 0.00–1.49)
INR: 1.5 (ref 0.00–1.49)
INR: 2.7 — ABNORMAL HIGH (ref 0.00–1.49)
Prothrombin Time: 14 seconds (ref 11.6–15.2)
Prothrombin Time: 14 seconds (ref 11.6–15.2)
Prothrombin Time: 14.5 seconds (ref 11.6–15.2)
Prothrombin Time: 16.5 seconds — ABNORMAL HIGH (ref 11.6–15.2)
Prothrombin Time: 16.7 seconds — ABNORMAL HIGH (ref 11.6–15.2)
Prothrombin Time: 19.6 seconds — ABNORMAL HIGH (ref 11.6–15.2)
Prothrombin Time: 13.5 seconds (ref 11.6–15.2)
Prothrombin Time: 14.1 seconds (ref 11.6–15.2)
Prothrombin Time: 15.9 seconds — ABNORMAL HIGH (ref 11.6–15.2)
Prothrombin Time: 18.3 seconds — ABNORMAL HIGH (ref 11.6–15.2)
Prothrombin Time: 28.2 seconds — ABNORMAL HIGH (ref 11.6–15.2)

## 2011-01-17 LAB — TRIGLYCERIDES
Triglycerides: 57 mg/dL (ref <150)
Triglycerides: 125 mg/dL (ref <150)
Triglycerides: 55 mg/dL (ref <150)

## 2011-01-17 LAB — BODY FLUID CULTURE

## 2011-01-17 LAB — CROSSMATCH: ABO/RH(D): B POS

## 2011-01-17 LAB — DIFFERENTIAL
Basophils Absolute: 0 10*3/uL (ref 0.0–0.1)
Basophils Absolute: 0 10*3/uL (ref 0.0–0.1)
Basophils Absolute: 0 10*3/uL (ref 0.0–0.1)
Basophils Absolute: 0 10*3/uL (ref 0.0–0.1)
Basophils Relative: 0 % (ref 0–1)
Basophils Relative: 1 % (ref 0–1)
Basophils Relative: 0 % (ref 0–1)
Basophils Relative: 0 % (ref 0–1)
Eosinophils Absolute: 0 10*3/uL (ref 0.0–0.7)
Eosinophils Absolute: 0 10*3/uL (ref 0.0–0.7)
Eosinophils Absolute: 0.2 10*3/uL (ref 0.0–0.7)
Eosinophils Absolute: 0.2 10*3/uL (ref 0.0–0.7)
Eosinophils Absolute: 0.1 10*3/uL (ref 0.0–0.7)
Eosinophils Absolute: 0.1 10*3/uL (ref 0.0–0.7)
Eosinophils Relative: 0 % (ref 0–5)
Eosinophils Relative: 0 % (ref 0–5)
Eosinophils Relative: 1 % (ref 0–5)
Eosinophils Relative: 2 % (ref 0–5)
Eosinophils Relative: 1 % (ref 0–5)
Lymphocytes Relative: 2 % — ABNORMAL LOW (ref 12–46)
Lymphocytes Relative: 7 % — ABNORMAL LOW (ref 12–46)
Lymphocytes Relative: 8 % — ABNORMAL LOW (ref 12–46)
Lymphs Abs: 0.5 10*3/uL — ABNORMAL LOW (ref 0.7–4.0)
Lymphs Abs: 0.7 10*3/uL (ref 0.7–4.0)
Lymphs Abs: 1.1 10*3/uL (ref 0.7–4.0)
Lymphs Abs: 0.8 10*3/uL (ref 0.7–4.0)
Monocytes Absolute: 0.7 10*3/uL (ref 0.1–1.0)
Monocytes Absolute: 1.4 10*3/uL — ABNORMAL HIGH (ref 0.1–1.0)
Monocytes Absolute: 0.9 10*3/uL (ref 0.1–1.0)
Monocytes Relative: 6 % (ref 3–12)
Monocytes Relative: 6 % (ref 3–12)
Neutro Abs: 14.9 10*3/uL — ABNORMAL HIGH (ref 1.7–7.7)
Neutro Abs: 7.8 10*3/uL — ABNORMAL HIGH (ref 1.7–7.7)
Neutrophils Relative %: 86 % — ABNORMAL HIGH (ref 43–77)
Neutrophils Relative %: 81 % — ABNORMAL HIGH (ref 43–77)
Neutrophils Relative %: 90 % — ABNORMAL HIGH (ref 43–77)

## 2011-01-17 LAB — VANCOMYCIN, RANDOM: Vancomycin Rm: 8 ug/mL

## 2011-01-17 LAB — CULTURE, ROUTINE-ABSCESS

## 2011-01-17 LAB — HEPARIN LEVEL (UNFRACTIONATED)
Heparin Unfractionated: 0.38 IU/mL (ref 0.30–0.70)
Heparin Unfractionated: 0.64 IU/mL (ref 0.30–0.70)
Heparin Unfractionated: 0.64 IU/mL (ref 0.30–0.70)
Heparin Unfractionated: 1.08 IU/mL — ABNORMAL HIGH (ref 0.30–0.70)
Heparin Unfractionated: <0.1 IU/mL — ABNORMAL LOW (ref 0.30–0.70)
Heparin Unfractionated: 0.25 IU/mL — ABNORMAL LOW (ref 0.30–0.70)
Heparin Unfractionated: 0.28 IU/mL — ABNORMAL LOW (ref 0.30–0.70)
Heparin Unfractionated: 0.31 IU/mL (ref 0.30–0.70)
Heparin Unfractionated: 0.33 IU/mL (ref 0.30–0.70)
Heparin Unfractionated: 0.33 IU/mL (ref 0.30–0.70)
Heparin Unfractionated: 0.37 IU/mL (ref 0.30–0.70)
Heparin Unfractionated: 0.43 IU/mL (ref 0.30–0.70)
Heparin Unfractionated: 0.56 IU/mL (ref 0.30–0.70)
Heparin Unfractionated: 0.94 IU/mL — ABNORMAL HIGH (ref 0.30–0.70)

## 2011-01-17 LAB — URINALYSIS, MICROSCOPIC ONLY
Glucose, UA: NEGATIVE mg/dL
Hgb urine dipstick: NEGATIVE
Ketones, ur: NEGATIVE mg/dL
Leukocytes, UA: NEGATIVE
Leukocytes, UA: NEGATIVE
Nitrite: NEGATIVE
Protein, ur: NEGATIVE mg/dL
Protein, ur: NEGATIVE mg/dL
Urobilinogen, UA: 0.2 mg/dL (ref 0.0–1.0)
Urobilinogen, UA: 0.2 mg/dL (ref 0.0–1.0)

## 2011-01-17 LAB — PREPARE FRESH FROZEN PLASMA

## 2011-01-17 LAB — FUNGUS CULTURE W SMEAR

## 2011-01-17 LAB — CLOSTRIDIUM DIFFICILE EIA
C difficile Toxins A+B, EIA: NEGATIVE
C difficile Toxins A+B, EIA: NEGATIVE

## 2011-01-17 LAB — VANCOMYCIN, TROUGH
Vancomycin Tr: 15.1 ug/mL (ref 10.0–20.0)
Vancomycin Tr: 15.8 ug/mL (ref 10.0–20.0)
Vancomycin Tr: 36.8 ug/mL (ref 10.0–20.0)
Vancomycin Tr: 37.3 ug/mL (ref 10.0–20.0)
Vancomycin Tr: 6 ug/mL — ABNORMAL LOW (ref 10.0–20.0)

## 2011-01-17 LAB — CULTURE, BLOOD (ROUTINE X 2)

## 2011-01-17 LAB — POCT I-STAT 4, (NA,K, GLUC, HGB,HCT)
Glucose, Bld: 214 mg/dL — ABNORMAL HIGH (ref 70–99)
HCT: 28 % — ABNORMAL LOW (ref 36.0–46.0)

## 2011-01-17 LAB — PREALBUMIN
Prealbumin: 12.3 mg/dL — ABNORMAL LOW (ref 18.0–45.0)
Prealbumin: 11.3 mg/dL — ABNORMAL LOW (ref 18.0–45.0)

## 2011-01-17 LAB — CREATININE, FLUID (PLEURAL, PERITONEAL, JP DRAINAGE): Creat, Fluid: 1.2 mg/dL

## 2011-01-17 LAB — URINE CULTURE: Special Requests: NEGATIVE

## 2011-01-17 LAB — FUNGAL SUSCEPTIBILITY

## 2011-01-17 LAB — TSH: TSH: 1.366 u[IU]/mL (ref 0.350–4.500)

## 2011-01-17 LAB — CHOLESTEROL, TOTAL
Cholesterol: 106 mg/dL (ref 0–200)
Cholesterol: 116 mg/dL (ref 0–200)

## 2011-01-17 LAB — ANAEROBIC CULTURE

## 2011-01-17 LAB — POTASSIUM: Potassium: 4 mEq/L (ref 3.5–5.1)

## 2011-01-17 LAB — CREATININE, SERUM
GFR calc Af Amer: >60 mL/min (ref >60)
GFR calc non Af Amer: >60 mL/min (ref >60)

## 2011-01-18 LAB — APTT: aPTT: 24 seconds (ref 24–37)

## 2011-01-18 LAB — CBC
HCT: 33.5 % — ABNORMAL LOW (ref 36.0–46.0)
HCT: 34.9 % — ABNORMAL LOW (ref 36.0–46.0)
HCT: 39.4 % (ref 36.0–46.0)
Hemoglobin: 10.7 g/dL — ABNORMAL LOW (ref 12.0–15.0)
Hemoglobin: 11.6 g/dL — ABNORMAL LOW (ref 12.0–15.0)
Hemoglobin: 13.3 g/dL (ref 12.0–15.0)
MCHC: 33.2 g/dL (ref 30.0–36.0)
MCHC: 33.3 g/dL (ref 30.0–36.0)
MCHC: 33.3 g/dL (ref 30.0–36.0)
MCHC: 33.4 g/dL (ref 30.0–36.0)
MCHC: 33.9 g/dL (ref 30.0–36.0)
MCV: 96 fL (ref 78.0–100.0)
MCV: 96.6 fL (ref 78.0–100.0)
MCV: 97.2 fL (ref 78.0–100.0)
Platelets: 185 10*3/uL (ref 150–400)
Platelets: 209 10*3/uL (ref 150–400)
Platelets: 251 10*3/uL (ref 150–400)
RBC: 3.31 MIL/uL — ABNORMAL LOW (ref 3.87–5.11)
RBC: 3.32 MIL/uL — ABNORMAL LOW (ref 3.87–5.11)
RBC: 3.36 MIL/uL — ABNORMAL LOW (ref 3.87–5.11)
RBC: 3.59 MIL/uL — ABNORMAL LOW (ref 3.87–5.11)
RDW: 15.7 % — ABNORMAL HIGH (ref 11.5–15.5)
RDW: 15.8 % — ABNORMAL HIGH (ref 11.5–15.5)
RDW: 16.6 % — ABNORMAL HIGH (ref 11.5–15.5)
WBC: 12.7 10*3/uL — ABNORMAL HIGH (ref 4.0–10.5)
WBC: 17.2 10*3/uL — ABNORMAL HIGH (ref 4.0–10.5)
WBC: 8.1 10*3/uL (ref 4.0–10.5)

## 2011-01-18 LAB — BASIC METABOLIC PANEL
BUN: 10 mg/dL (ref 6–23)
BUN: 11 mg/dL (ref 6–23)
BUN: 9 mg/dL (ref 6–23)
CO2: 26 mEq/L (ref 19–32)
CO2: 27 mEq/L (ref 19–32)
CO2: 28 mEq/L (ref 19–32)
CO2: 30 mEq/L (ref 19–32)
Calcium: 8.1 mg/dL — ABNORMAL LOW (ref 8.4–10.5)
Calcium: 8.3 mg/dL — ABNORMAL LOW (ref 8.4–10.5)
Calcium: 8.9 mg/dL (ref 8.4–10.5)
Chloride: 104 mEq/L (ref 96–112)
Chloride: 105 mEq/L (ref 96–112)
Chloride: 109 mEq/L (ref 96–112)
Creatinine, Ser: 0.66 mg/dL (ref 0.4–1.2)
Creatinine, Ser: 0.91 mg/dL (ref 0.4–1.2)
GFR calc Af Amer: >60 mL/min (ref >60)
GFR calc Af Amer: >60 mL/min (ref >60)
GFR calc non Af Amer: >60 mL/min (ref >60)
Glucose, Bld: 107 mg/dL — ABNORMAL HIGH (ref 70–99)
Glucose, Bld: 114 mg/dL — ABNORMAL HIGH (ref 70–99)
Glucose, Bld: 93 mg/dL (ref 70–99)
Potassium: 3.1 mEq/L — ABNORMAL LOW (ref 3.5–5.1)
Potassium: 3.2 mEq/L — ABNORMAL LOW (ref 3.5–5.1)
Sodium: 136 mEq/L (ref 135–145)

## 2011-01-18 LAB — URINALYSIS, ROUTINE W REFLEX MICROSCOPIC
Glucose, UA: NEGATIVE mg/dL
Hgb urine dipstick: NEGATIVE
Protein, ur: NEGATIVE mg/dL
Specific Gravity, Urine: 1.014 (ref 1.005–1.030)
pH: 7.5 (ref 5.0–8.0)

## 2011-01-18 LAB — COMPREHENSIVE METABOLIC PANEL
ALT: 26 U/L (ref 0–35)
Albumin: 4.2 g/dL (ref 3.5–5.2)
Alkaline Phosphatase: 81 U/L (ref 39–117)
Chloride: 100 mEq/L (ref 96–112)
Glucose, Bld: 107 mg/dL — ABNORMAL HIGH (ref 70–99)
Potassium: 3.7 mEq/L (ref 3.5–5.1)
Sodium: 137 mEq/L (ref 135–145)
Total Bilirubin: 0.8 mg/dL (ref 0.3–1.2)
Total Protein: 7.1 g/dL (ref 6.0–8.3)

## 2011-01-18 LAB — DIFFERENTIAL
Basophils Absolute: 0 10*3/uL (ref 0.0–0.1)
Basophils Relative: 0 % (ref 0–1)
Eosinophils Absolute: 0 10*3/uL (ref 0.0–0.7)
Monocytes Absolute: 0.2 10*3/uL (ref 0.1–1.0)
Monocytes Relative: 2 % — ABNORMAL LOW (ref 3–12)
Neutrophils Relative %: 94 % — ABNORMAL HIGH (ref 43–77)

## 2011-01-18 LAB — CREATININE, FLUID (PLEURAL, PERITONEAL, JP DRAINAGE): Creat, Fluid: 0.6 mg/dL

## 2011-01-18 LAB — CULTURE, ROUTINE-ABSCESS

## 2011-01-18 LAB — URINE MICROSCOPIC-ADD ON: Urine-Other: NONE SEEN

## 2011-01-18 LAB — PROTIME-INR: INR: 1.4 (ref 0.00–1.49)

## 2011-01-20 ENCOUNTER — Other Ambulatory Visit: Payer: Medicare Other

## 2011-01-24 ENCOUNTER — Ambulatory Visit
Admission: RE | Admit: 2011-01-24 | Discharge: 2011-01-24 | Disposition: A | Discharge status: Home / Self Care | Payer: Medicare Other | Source: Ambulatory Visit | Attending: Gastroenterology | Admitting: Gastroenterology

## 2011-01-24 DIAGNOSIS — R1030 Lower abdominal pain, unspecified: Secondary | ICD-10-CM

## 2011-01-28 LAB — CBC
MCHC: 33.4 g/dL (ref 30.0–36.0)
MCHC: 33.6 g/dL (ref 30.0–36.0)
MCV: 99.7 fL (ref 78.0–100.0)
Platelets: 199 10*3/uL (ref 150–400)
Platelets: 215 10*3/uL (ref 150–400)
RBC: 3.56 MIL/uL — ABNORMAL LOW (ref 3.87–5.11)
RDW: 14.5 % (ref 11.5–15.5)
RDW: 14.6 % (ref 11.5–15.5)
WBC: 6.4 10*3/uL (ref 4.0–10.5)

## 2011-01-28 LAB — BASIC METABOLIC PANEL
BUN: 12 mg/dL (ref 6–23)
CO2: 30 mEq/L (ref 19–32)
Calcium: 8.8 mg/dL (ref 8.4–10.5)
Chloride: 104 mEq/L (ref 96–112)
Creatinine, Ser: 0.69 mg/dL (ref 0.4–1.2)
Creatinine, Ser: 0.76 mg/dL (ref 0.4–1.2)
GFR calc Af Amer: >60 mL/min (ref >60)
Glucose, Bld: 130 mg/dL — ABNORMAL HIGH (ref 70–99)

## 2011-01-28 LAB — CROSSMATCH: ABO/RH(D): B POS

## 2011-01-28 LAB — APTT: aPTT: 25 seconds (ref 24–37)

## 2011-01-30 ENCOUNTER — Other Ambulatory Visit: Payer: Self-pay | Admitting: Dermatology

## 2011-02-04 NOTE — H&P (Signed)
  NAMETERRIKA, [name]                ACCOUNT NO.:  [id number]  MEDICAL RECORD NO.:  [id number]           PATIENT TYPE:  I  LOCATION:  1445                         FACILITY:  [facility]  PHYSICIAN:  Bertram Millard. Joory Gough, M.D.DATE OF BIRTH:  Mar 20, 1934  DATE OF ADMISSION:  11/25/2010 DATE OF DISCHARGE:  11/29/2010                             HISTORY & PHYSICAL   REASON FOR ADMISSION:  Left renal mass.  BRIEF HISTORY:  This 77 year old female is admitted for left open partial nephrectomy.  She has a history of bilateral renal masses, having been found on imaging for followup of breast cancer.  She has had treatment of 1 mass in each kidney using RFA, by Dr. Fredia Sorrow.  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  She has a remaining left renal mass, on conservative followup with  imaging, has increased in size.  There is no evidence of metastatic disease, and no residual functioning tissue in the other renal masses that were treated.  She presents at this time for definitive extirpation by partial nephrectomy of the remaining left lower pole mass.  PAST MEDICAL HISTORY:  Significant for breast cancer, which has been treated with chemotherapy.  She has a history of diabetes, GERD, hypothyroidism, osteoporosis and rheumatoid arthritis.  She has had prior breast biopsy/lumpectomy, hemorrhoidectomy, hysterectomy and excision of Morton neuroma.  MEDICATIONS ON ADMISSION:  Alprazolam, Ambien, Citrucel, dicyclomine, folic acid, methotrexate, multivitamins, Naprosyn, omega-3 capsules, omeprazole, Pataday ophthalmic solution, potassium chloride, simvastatin, Synthroid, triamterene/HCTZ, Tylenol with Codeine and vitamin D tablets.  ALLERGIES:  She is allergic to TETANUS TOXOID, PHENERGAN, MORPHINE DERIVATIVES and DILAUDID.  REVIEW OF SYSTEMS:  Essentially noncontributory.  PHYSICAL EXAMINATION:  GENERAL:  A pleasant elderly female.  She is in no distress. VITAL SIGNS:   Per recorded in the chart. HEENT:  Normal. NECK:  Supple without thyromegaly or adenopathy. CHEST:  Clear breath sounds. HEART:  Normal rate and rhythm. ABDOMEN:  Midline incision scar as well as a closed colostomy scar in the left lower quadrant.  No incisional hernias were noted.  No flank masses were present. EXTREMITIES:  No cyanosis, clubbing or edema.  LABORATORY DATA:  Hemogram upon admission revealed a hematocrit of 35.6%, slightly low, otherwise normal.  BMET was normal except for a glucose of 110.  IMPRESSION:  Left lower pole renal mass, growing, admitted for partial nephrectomy.  PLAN:  We will admit the patient for partial nephrectomy.  Risks and complications have been discussed with the patient and her husband.     Bertram Millard. Retta Diones, M.D.     SMD/MEDQ  D:  01/31/2011  T:  01/31/2011  Job:  [id number]  Electronically Signed by Marcine Matar M.D. on 02/04/2011 08:32:58 PM

## 2011-02-25 NOTE — Op Note (Signed)
NAMEMISSI, MCMACKIN                ACCOUNT NO.:  [id number]   MEDICAL RECORD NO.:  [id number]          PATIENT TYPE:  AMB   LOCATION:  DAY                          FACILITY:  [facility]   PHYSICIAN:  Timothy E. Earlene Plater, M.D. DATE OF BIRTH:  Oct 17, 1933   DATE OF PROCEDURE:  07/05/2007  DATE OF DISCHARGE:                               OPERATIVE REPORT   PREOP DIAGNOSIS:  Left breast cancer.   POSTOPERATIVE DIAGNOSES:  Left breast cancer.   PROCEDURE:  Placement of Port-A-Cath for chemo.   SURGEON:  Timothy E. Earlene Plater, M.D.   ANESTHESIA:  Local standby.   INDICATIONS:  Ms. Rogstad has stage III carcinoma of breast, and has  elected to proceed with chemotherapy under the direction of Dr.  Darnelle Catalan.  She is here today for Port-A-Cath placement.  She is seen,  identified, and the permit signed.   DESCRIPTION OF PROCEDURE:  She is taken to the operating room and placed  supine, awake.  Her arms were tucked and position correctly, and then IV  sedation was given.  The bed was placed in the head-down position.   The upper chest and neck were prepped and draped in the usual fashion.  Marcaine 1/4% with epinephrine was used prior to any skin incision.  The  right subclavian vein was isolated on the first pass without  complication.  The wire was guided through the needle into the central  veins.  This was ascertained by x-ray.  Needle removed.  The skin  puncture site opened a bit; and the introducer placed over the wire,  driven down into the central veins ascertained by radiology; and the  wire removed.   Then the central portion of the introducer removed and the catheter of  the Port-A-Cath device was flushed and inserted through that introducer  and the introducer then removed.  The position of the Port-A-Cath  catheter device was ascertained by x-ray on 3 different occasions with  the patient in three different positions.  Using more local anesthesia,  a pocket was made for the port  device.  It was quite close to the  insertion device.  With such a large right breast, I did not want to put  the port device within the breast tissue.  The catheter reached this  nicely and was connected.  The apparatus flushed and irrigated nicely.   The wound was closed after capture of the port device to the premuscular  fascia with Prolene, and the wound closed with Monocryl.  A small  plastic covering made.  Then the port was accessed and flushed and left  accessed.  A larger  dressing applied.  All counts were correct.  She tolerated it well.  No  apparent complications.  She was moved to recovery room.  She will be  observed as usual.  A chest x-ray will be made and followed up with Dr.  Darnelle Catalan, and her chemo is for tomorrow.  Wound care instructions given.  Will follow the patient routinely.      Timothy E. Earlene Plater, M.D.  Electronically Signed     TED/MEDQ  D:  07/05/2007  T:  07/05/2007  Job:  [id number]

## 2011-02-25 NOTE — H&P (Signed)
NAMEKORENA, NASS NO.:  [id number]   MEDICAL RECORD NO.:  [id number]          PATIENT TYPE:  EMS   LOCATION:  ED                           FACILITY:  [facility]   PHYSICIAN:  Hollice Espy, M.D.DATE OF BIRTH:  July 09, 1934   DATE OF ADMISSION:  06/05/2009  DATE OF DISCHARGE:                              HISTORY & PHYSICAL   PRIMARY CARE PHYSICIAN:  Dr. Shaune Pollack.   RHEUMATOLOGIST:  Dr. Lennox Pippins.   CONSULTANT ON THIS CASE:  Dr. Wenda Low, general surgery.   UROLOGIST:  Dr. Marcine Matar.   CHIEF COMPLAINT:  Abdominal pain.   HISTORY OF PRESENT ILLNESS:  The patient is a 77 year old white female  with a past medical history of rheumatoid arthritis, colorenal fistula  and previous renal tumor who has been followed by Dr. Retta Diones after  the fistula was found to be a complication from radio ablation of renal  tumors.  She has been on Keflex for suppression which was recently  stopped.  On day of admission, patient had urinated at 4 a.m. and all of  a sudden started having sharp right, left lower abdominal pain.  She  went back to sleep but then woke up several hours later with even  worsening pain.  The pain was described as initially starting in the  right lower quadrant but then moving over to the left lower quadrant,  feeling worse.  After she had a bowel movement she felt faint and  continued to have severe pain.  When she presented to Dr. Katina Degree  office she was feeling lightheaded and started having diaphoresis.  Dr.  Coral Spikes evaluated the patient and found her to have some guarding in the  left lower quadrant of the abdomen.  Dr. Retta Diones was consulted and it  was advised that patient be sent over to the emergency room.  After in  the emergency room the patient underwent an abdominal CT scan which  noted signs consistent with a diverticulitis type picture in the sigmoid  colon, marked thickening of the wall and some small free air  about  sigmoid within the abdomen and consistent with micro perforation.  There  was also noted to be an enhancing lesion in the lower pole of the left  kidney worrisome for renal cell carcinoma.  The patient was also given  medication for pain and nausea and started feeling better.  Currently  she is doing okay.  She denies any headache, visual changes, dysphagia,  chest pain, palpitations, shortness of breath, wheeze, cough.  Her  abdominal pain is much improved.  She has no hematuria or dysuria, but  does note some polyuria as of late and, as well, an increased number of  bowel movements.  She denies any focal extremity numbness, weakness or  pain.  Review of systems otherwise negative.   PAST MEDICAL HISTORY:  Includes:  1. A history of breast CA of the left breast.  2. Rheumatoid arthritis.  3. Hypothyroidism.  4. Hyperlipidemia.  5. Colon polyps.  6. Hypertension.  7. Renal cell carcinoma treated with radiofrequency ablation.  8.  Secondary colorenal fistula.  9. History of cervical fusion.   MEDICATIONS:  She is on:  1. Atenolol 25.  2. Ambien 5.  3. Omega 3 two tablets b.i.d.  4. K-Dur 20 mEq.  5. Nortriptyline 70 mg.  6. Darvocet-N 100 p.r.n.  7. Vitamin D.  8. Multivitamins.  9. Temovate 0.05% to affected area p.r.n.  10.Folate 1.   Recently started on:  1. Cipro 500 p.o. b.i.d.  2. Prilosec 20.  3. Synthroid 125.  4. Zocor 20.  5. Methotrexate 15 mg weekly.  6. Triamterene/hydrochlorothiazide 37.5/25 p.o. daily.   DRUG ALLERGIES:  1. TETANUS TOXOID.  2. TAXOL.   SOCIAL HISTORY:  No tobacco, heavy alcohol or drug use.   FAMILY HISTORY:  Noncontributory.   FAMILY HISTORY:  Noncontributory.   PHYSICAL EXAM:  PATIENT'S VITALS ON ADMISSION:  Temperature 98.3, heart  rate 79, blood pressure 124/64, now down to 94/50, respirations 20,  oxygen saturation 97% on room air.  In general, is alert and oriented  x3, in no apparent distress.  HEENT:  Normocephalic,  atraumatic.  Mucous membranes are slightly dry.  She has no carotid bruits.  HEART:  Regular rate and rhythm, S1-S2.  LUNGS:  Clear to auscultation bilaterally.  ABDOMEN:  Soft, slightly distended.  Some mild tenderness and guarding  in the left lower quadrant, a little on the right lower quadrant, much  more so on the left.  Decreased bowel sounds.  EXTREMITIES:  Show no clubbing, cyanosis, trace pitting edema.   LAB WORK:  White count 8.1, H and H 13 and 39, MCV of 95, platelet count  205, 94% shift.  Sodium 137, potassium 3.7, chloride 107, bicarbonate  30, BUN 40, creatinine 0.9, glucose 107.  LFTs are unremarkable.  Lipase  21.  UA negative.  Micro is negative.  CT scan is as per HPI.   ASSESSMENT/PLAN:  1. Diverticulitis with micro perforation.  Treat nothing by mouth,      intravenous antibiotics, pain and nausea control.  Surgery will      follow in case there are worsening complications.  2. Rheumatoid arthritis.  Methotrexate has actually been on hold.      Will continue to make her completely n.p.o.      and continue to monitor.  3. History of colonic renal fistula, currently stable.  4. Hypothyroidism and hypertension, again holding all medications and      will hydrate.      Hollice Espy, M.D.  Electronically Signed     SKK/MEDQ  D:  06/05/2009  T:  06/05/2009  Job:  [id number]   cc:   Duncan Dull, M.D.  Fax: [phone]   Bertram Millard. Dahlstedt, M.D.  Fax: [phone]   [place] Daphine Deutscher, MD  [address], [address]  [place]  [place] [address]   Demetria Pore. Coral Spikes, M.D.  Fax: [phone]

## 2011-02-25 NOTE — Consult Note (Signed)
NAMEPRISILLA, [name]                ACCOUNT NO.:  [id number]   MEDICAL RECORD NO.:  [id number]          PATIENT TYPE:  INP   LOCATION:  1521                         FACILITY:  [facility]   PHYSICIAN:  Ardeth Sportsman, MD     DATE OF BIRTH:  [birth year]   DATE OF CONSULTATION:  DATE OF DISCHARGE:                                 CONSULTATION   PRIMARY CARE PHYSICIAN:  Duncan Dull, M.D.   RHEUMATOLOGIST:  Demetria Pore. Coral Spikes, M.D.   GASTROENTEROLOGIST:  Llana Aliment. Randa Evens, M.D.   UROLOGIST:  Bertram Millard. Dahlstedt, M.D.   Requesting physician is Hollice Espy, M.D., [facility].   SURGEON:  Ardeth Sportsman, M.D., Dr. Daphine Deutscher.   REASON FOR EVALUATION:  Perforated diverticulitis with abscess.   HISTORY OF PRESENT ILLNESS:  Ms. Franco is a 77 year old female with  immuno-suppressed on methotrexate for rheumatoid arthritis.  She also  has history of bilateral renal masses suspicious for renal cancer,  status post radiofrequency ablation back in November 17, 2008 by Dr.  Sherrine Maples T. Yamagata, complicated by gas and urinary symptoms suspicious  for a colo renal fistula, seems to have improved on antibiotic  suppression and conservative management.   The patient has a distant history  of diverticulosis and intermittent  constipation.  She has had issues of  change in bowel habits with all  these events over the past year.  She had some occasional loose stools,  up to 6-7 bowel movements a day, but seemed to have been doing better on  oral antibiotics.   On August 24 she had severe pain in the lower abdomen after urinating.  It intensified.  She saw Dr. Coral Spikes who sent the patient to the  emergency room.  CAT scan was concerning for sigmoid diverticulitis with  micro perforation.  She was admitted by hospitalists and placed on IV  antibiotics.  She initially improved the next day but then started to  feel more uncomfortable.  Her white count continued to climb.  Repeat  CAT scan shows a  pelvic abscess.  Based on these findings Dr. Rito Ehrlich  requested surgical evaluation.   The patient notes her pain is primarily in the left lower quadrant,  though she has a little mild right-sided pain as well.  She denies any  major dysuria or pyuria at this time.  No nausea or vomiting.  Her  appetite is down.   PAST MEDICAL HISTORY:  1. Diverticulosis with recent diverticulitis.  2. Colon polyps followed by colonoscopy.  Last evaluation she believes      was in 2007.  3. Stage I left breast cancer treated with wide local excision and      postadjuvant knee therapy followed by Dr. Darnelle Catalan.  4. Hyperthyroidism followed by Dr. Dorisann Frames.  5. Hypercholesterolemia.  6. Intermittent tachycardia.  7. Hypertension.  8. Bilateral renal cell masses for cancer with status was frequency      ablation of two of the three lesions.  The left inferior pole      lesion has increased by MRI from last month.  9.  Question of colo renal fistula after radio frequency ablation      followed by Dr. Patsi Sears.   PAST SURGICAL HISTORY:  1. She had vaginal hysterectomy and  salpingo-oophorectomy for a      uterine prolapse in 2005.  2. She had a cervical fusion in 1997.  She had radio frequent      ablations of bilateral kidney tumors November 17, 2008/  3/  Left partial mastectomy in 2008, and Port-A-Cath placement in 2008.   MEDICATIONS:  Medications at home include atenolol, Ambien, Omega-3,  potassium, nortriptyline, Darvocet, Vitamin D, multivitamin, folic acid,  Synthroid, simvastatin, methotrexate, omeprazole, and Dyazide.   She is currently on Cipro and Flagyl IV in the hospital and most of her  meds are held at this point.   ALLERGIES:  1. She has allergy to TAXOL.  2. TETANUS TOXOID.   SOCIAL HISTORY:  She is married, in a stable relationship, and here with  her husband.  She denies any history of any tobacco, alcohol, or drug  use.   FAMILY HISTORY:  Question of colon  cancer in the past.  Otherwise no  major cardiopulmonary disease.   REVIEW OF SYSTEMS:  As per HPI.  She has had some subjective fevers but  no chills, no weight gain or weight loss.  HEENT:  Negative  CARDIAC/RESPIRATORY:  Negative.  MUSCULOSKELETAL:  As noted above with  rheumatoid arthritis.  No recent back pain.  GU:  As noted above.  No  hematuria or pyuria this time.  GI:  No hematemesis, hematochezia, no  dysphagia.  No significant heartburn or reflux at this time.  No melena.  NEUROLOGIC/PSYCHIATRIC:  Negative.  BREASTS:  Breast cancer as noted  above, recent mammogram she says was normal.  GYN:  As noted above.  Status post hysterectomy.  DERMATOLOGIC:  Negative.  HEME/LYMPH:  As  noted above with the breast cancer, otherwise negative.   PHYSICAL EXAMINATION:  VITAL SIGNS:  Her current temperature is 98.2.  She had a T-max of 100 since her admission.  Pulse is around 113,  respirations 18, 96% on room air.  Blood pressure 152/83.  GENERAL:  She is a well-developed, well-nourished, overweight female  lying in bed, uncomfortable but not frankly toxic.  PSYCHIATRIC:  She is of at least average intelligence.  No evidence of  any dementia, delirium,psychosis, or paranoia.  She is here with her  husband.  HEENT:  Eyes:  Pupils equal, round, and reactive to light.  Extraocular  movements are intact.  LYMPH:  No head, neck, axillary, of groin lymphadenopathy.  CHEST:  Clear to auscultation bilaterally.  No wheezes, rales, rhonchi.  HEART:  Regular rate and rhythm.  No murmurs, clicks, or rubs.  NECK:  Supple.  No masses.  Trachea is midline.  HEENT:  She is normocephalic.  Mucous membranes are moist.  Nasopharynx  and oropharynx are clear.  ABDOMEN:  Obese but soft.  She has some tenderness in her left lower  quadrant, greater in right lower quadrant.  It is mild with no severe  peritoneal signs.  Upper abdomen is soft.  No umbilical incisions.  GENITALIA:  Normal external female  genitalia.  No inguinal hernias.  RECTAL:  Deferred.  EXTREMITIES:  I do not see any obvious clubbing, cyanosis, or edema.  MUSCULOSKELETAL:  She seems to have pretty good range of motion of her  hands and wrists as well as her shoulders, elbows, hips, knees, and  ankles.  SKIN:  No petechiae or purpura.  No other sores or lesions.  BREASTS:  Deferred per patient request.   LABORATORY DATA:  She has a white count that has been increasing,  initially was 8 and more recently up to 19.  Hemoglobin is around 10.7.  Potassium slightly at 3.1.  Her albumin was 4.2 on admission.  Urinalysis has some trace leukocytes but no nitrites.  Micro is  otherwise negative.  There is some cloudiness to it.   CT scan shows evidence of sigmoid inflammation with dots of air  consistent with perforation two days ago.  The repeat has pelvic fluid  collection between the bladder and the rectum, strongly suspicious for  an abscess of about 7 x 5 cm in size.  I reviewed with the radiologist  on call.  In reviewing prior films there was some air in the left kidney  after her radio frequency ablation suspicious for possible fistula but  no definite proof of it.  Certainly things look better now with some  minimal streak around that area of fluid but no obvious evidence of any  concerning or persistent fistula at this time.   ASSESSMENT/PLAN:  A 77 year old female with perforated diverticulitis,  worsening with abscess.  1. Continue IV antibiotics.  If she does not improve, I would switch      her to IV Zosyn from the Cirpo & Flagyl, but keep it for now.  2. Percutaneous drainage of pelvic abscess.  Will have to coordinate      that with radiology.  3. If she worsens or does not improve then she would require segmental      colonic resection with probable colostomy given all the      contamination.  I am hesitant to do this, given the history of      radiofrequency ablation and possible left renal colonic  fistula.      Dr. Retta Diones, her urologist, is aware and would plan to do a      cystoscopy and more aggressive evaluation to make sure there is no      evidence of any fistula or any other abnormalities to also help      identify and protect the ureter, given all that has been going on      with her.  She and her husband understand the importance of these      more aggressive treatments.   In the end if this is the only attack she has had for decades, then she  may not ultimately require resection, although we will just have to see.  This is a pretty significant attack.  However, given her complicated  issues with her kidney,  her operative risks are increased for further problems.  I would like to  get her through this and then consult our partners and Dr. Retta Diones  about a more definitive plan should it be needed.  She and her husband  expressed appreciation.      Ardeth Sportsman, MD  Electronically Signed     SCG/MEDQ  D:  06/07/2009  T:  06/08/2009  Job:  [id number]   cc:   Duncan Dull, M.D.  Fax: [phone]   Demetria Pore. Coral Spikes, M.D.  Fax: [phone]   Llana Aliment. Malon Kindle., M.D.  Fax: [phone]   Bertram Millard. Dahlstedt, M.D.  Fax: [phone]   Hollice Espy, M.D.

## 2011-02-25 NOTE — Discharge Summary (Signed)
[name], [name]                ACCOUNT NO.:  [id number]   MEDICAL RECORD NO.:  [id number]          PATIENT TYPE:  INP   LOCATION:  1521                         FACILITY:  [facility]   PHYSICIAN:  Hollice Espy, M.D.DATE OF BIRTH:  December 24, 1933   DATE OF ADMISSION:  06/05/2009  DATE OF DISCHARGE:                               DISCHARGE SUMMARY   INTERIM DISCHARGE SUMMARY:  This summary will cover events from August  24 to June 12, 2009.   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  When  she was evaluated in the emergency room she was found to have some signs  consistent with a diverticulitis with micro perforation.  The patient  was made n.p.o., put on IV antibiotics, specifically Cipro and Flagyl.  By hospital day #2 her pain control overall was better.  She continued  to have some mild nausea but her white count had actually increased from  8.1 on admission to 16.9.  She was continued to be followed and then on  hospital day #3 her white blood cell count had actually slightly  increased further up to 19.  I discussed this case with [facility].  The patient's films initially had been reviewed by Surgery in  the emergency room and they followed up again on August 26.  The patient  underwent a CT scan of the abdomen and pelvis which noted an increased  amount of collecting fluid felt to be possibly a developing abscess.  Interventional Radiology at the recommendation of General Surgery was  consulted and a drain was placed on August 27.  A fair amount of fluid  continued to drain.  The patient continued to improve.  Her white blood  cell count decreased down  to 12.7 by August 27 and by August 29 her  white count had normalized.  Her abdomen was still distended.  She still  had continued drainage.  She was started on clear liquids which she  tolerated well.  Her diet was advanced to full liquids on August 30, and  the patient actually ended up instead of having increased abdominal  pain, had persistent nausea and vomiting.  Her IV became infiltrated in  her right arm leading to some right arm swelling.  Because of her  previous history of breast cancer with mastectomy on the left side that  arm was not available to be used.  The patient then had a PICC line  placed in her right arm.  She was started back on IV antibiotics, IVP  and a nausea medication.  By August 31 she was feeling much better.  Her  pain had improved, her nausea had improved.  She was feeling relatively  more comfortable.  She was tolerating clear liquids with some moderate  amount of cramping and pain  but minimally.  She looked to otherwise be  doing okay.  Her white blood cell count had slightly increased up to  12.7 after 2 days and it is unclear if it is on the way back down or if  it is trending upward.   PLAN:  The plan will be at this time in regard to the patient's  diverticulitis and secondary micro perforation with fluid collection, to  recheck a white blood cell count on June 13, 2009.  If it has  increased or if the patient starts having increased abdominal pain, to  check a followup CT of the abdomen and pelvis.  Interventional Radiology  and [facility] are following.  We will start TNA for  nutrition.  Her blood pressure is starting to trend up so we will trend  down her IV fluids and resume her oral medications and discontinue her  drain when the drainage  has discontinued.  In the interim we will also check a right upper  extremity Doppler to evaluate the patient's arm which most likely is  just infiltration of IV but the fact that it is  persisting and still  causing some discomfort, we will rule out a deep venous thrombosis.      Hollice Espy, M.D.  Electronically Signed     SKK/MEDQ  D:  06/12/2009  T:  06/12/2009  Job:  [id number]   cc:   Duncan Dull, M.D.  Fax: [phone]   Demetria Pore. Coral Spikes, M.D.  Fax: [phone]

## 2011-02-25 NOTE — Op Note (Signed)
[name], [name]                ACCOUNT NO.:  [id number]   MEDICAL RECORD NO.:  [id number]          PATIENT TYPE:  AMB   LOCATION:  DSC                          FACILITY:  [facility]   PHYSICIAN:  Timothy E. Earlene Plater, M.D. DATE OF BIRTH:  01-17-1934   DATE OF PROCEDURE:  06/02/2007  DATE OF DISCHARGE:                               OPERATIVE REPORT   PREOPERATIVE DIAGNOSIS:  Carcinoma left breast.   PROCEDURE:  Injection blue dye, partial mastectomy left, sentinel lymph  node biopsy left.   SURGEON:  Kendrick Ranch, M.D.   ANESTHESIA:  General.   [name] is a healthy 77 year old female who discovered a mass in her  breast.  X-rays confirmed and core biopsy showed invasive carcinoma and  DCIS.  She has been carefully counseled, wishes to proceed with this  surgery as planned.  The patient was seen, identified the left breast  marked.  She had previously been injected with technetium around the  nipple.  The patient was taken to the operating room, placed supine.  LMA anesthesia provided.  The breast was examined.  The mass was located  up in the tail of the breast, very easily palpable and the axilla was  negative to exam.  I injected diluted blue dye 5 mL subcutaneously in  the left nipple-areolar complex.  This was gently massaged for 5 minutes  and then the procedure was begun by prepping the entire chest with  Betadine and draped off as a sterile field.  Using the Neoprobe, one hot  area of the axilla was identified.  No other areas of high counts were  noted.  This area was marked, anesthetized with 0.25% Marcaine with  epinephrine and incision made taken down to the deep axillary tissue  where a single lymph node was identified.  It was hot and blue and it  was gently and bluntly removed from the surrounding fatty tissue.  It  was submitted in saline as sentinel node #1.  Further search and careful  exploration of the axilla revealed no other areas of activity.  The  specimen was  sent to lab.   Attention turned to the breast that had been marked previously after the  prep and drape.  Again the mass was easily palpable.  The skin was  anesthetized with Marcaine, generous incision made and then the  superficial fatty tissue right under the skin edge, dissection was begun  both superior and inferior and around about the palpable tumor.  Generous partial mastectomy accomplished involving the upper outer  quadrant of the breast and the entire axillary tail of the breast.  A  deep level was the pectoralis.  The tumor was carefully marked with  sutures and that was sent to the lab for gross opinion.  I elected then  since the tumor did feel the superficial, I removed a portion of skin  from the superior incision and the inferior incision and marked them as  anterior superior margin and anterior inferior margin and these were  sent separately for permanent section.  Meanwhile bleeding was  controlled in the wound.  It was closed in layers with Vicryl, skin was  closed with Monocryl.  The pathologist called back with a negative lymph  node and he will gross the partial mastectomy.  Both wounds were closed  and Steri-Strips applied.  Counts correct.  The patient tolerated it  well and was removed to recovery room in good condition.   Written and verbal instructions given she and her husband along with  Darvocet-N 100 mg #60 and she will be seen and followed closely in the  office.      Timothy E. Earlene Plater, M.D.  Electronically Signed     TED/MEDQ  D:  06/02/2007  T:  06/03/2007  Job:  [id number]   cc:   Duncan Dull, M.D.  [facility]  Raymond Gurney C. Magrinat, M.D.

## 2011-02-25 NOTE — Op Note (Signed)
NAMEJOSEPH, JOHNS                ACCOUNT NO.:  [id number]   MEDICAL RECORD NO.:  [id number]          PATIENT TYPE:  AMB   LOCATION:  DSC                          FACILITY:  [facility]   PHYSICIAN:  Angelia Mould. Derrell Lolling, M.D.DATE OF BIRTH:  1934-03-13   DATE OF PROCEDURE:  02/07/2008  DATE OF DISCHARGE:                               OPERATIVE REPORT   PREOPERATIVE DIAGNOSIS:  Breast cancer.   POSTOPERATIVE DIAGNOSIS:  Breast cancer.   OPERATION PERFORMED:  Removal of Port-A-Cath.   SURGEON:  Angelia Mould. Derrell Lolling, MD   OPERATIVE INDICATIONS:  This is a 77 year old white female who underwent  breast conservation surgery for a left breast cancer by Dr. Kendrick Ranch on  June 02, 2007.  She received Taxol and Herceptin.  She was referred  back to [place] for removal of her port.  She has been counseled as an  outpatient regarding this procedure.  She was brought to operating room  electively.   OPERATIVE TECHNIQUE:  The patient was brought to the minor procedure  area at [facility] and placed on the operating table with  her arms at her sides.  The right infraclavicular area was prepped and  draped in a sterile fashion.  The patient was identified as to correct  patient and correct procedure.  1% Xylocaine with epinephrine was used  as a local infiltration anesthetic.  A transverse incision was made  through the previous scar just below the port.  Dissection was carried  down through the subcutaneous tissue until I entered the capsule of the  port.  The capsule was opened up and the port was exposed.  I divided  some scar tissue around the port.  I divided the Prolene sutures and  removed those.  I then removed the port and the catheter intact.  There  was no bleeding.  The subcutaneous tissues were closed with interrupted  sutures of 3-0 Vicryl.  The skin was closed with a running subcuticular  suture of 4-0 Monocryl and Dermabond.  Clean bandages were placed and  the patient taken to  recovery room in stable condition.   ESTIMATED BLOOD LOSS:  About 5 mL.   COMPLICATIONS:  None.   COUNTS:  Sponge, needle, and instrument counts were correct.      Angelia Mould. Derrell Lolling, M.D.  Electronically Signed    HMI/MEDQ  D:  02/07/2008  T:  02/08/2008  Job:  [id number]   cc:   Valentino Hue. Magrinat, M.D.

## 2011-02-28 NOTE — H&P (Signed)
[facility]  Patient:    [name], [name]                         MRN: [id number] Attending:  Rande Brunt. Eda Paschal, M.D.                         History and Physical  CHIEF COMPLAINT:              Intracavitary mass.  HISTORY OF PRESENT ILLNESS:   The patient is a 77 year old gravida 2, para 2, abortus 0, who came to see me in early June because of a sudden onset of uterine prolapse.  As a result of the sudden onset of it and my concern that there might be some occult pathology pushing the uterus down, she underwent an ultrasound.  Her adnexa were normal.  She had several intramural fibroids, but what was an asymptomatic finding was that she had an endometrial cavity mass of about 2 cm even though she has had no postmenopausal bleeding at all.  As a result of this, she now enters the hospital for hysteroscopy D&C for excision of the above and obvious diagnosis.  PAST MEDICAL HISTORY:         The patient is on atenolol 25 mg daily, nortriptyline 25 mg daily and coated aspirin.  ALLERGIES:                    She is allergic to no drugs.  SURGICAL HISTORY:             Reveals a previous laparotomy with a benign ovarian cystectomy on the right in 1962 in [place].  She has also had colonoscopy with frequent polyps by Genene Churn. Sherin Quarry, M.D.  FAMILY HISTORY:               Maternal aunt has had breast cancer.  The same maternal aunt had osteoporosis.  Father has had coronary artery disease.  SOCIAL HISTORY:               She is a nonsmoker, nondrinker.  REVIEW OF SYSTEMS:            HEENT:  Negative.  CARDIAC:  Negative except as above.  GI:  Negative.  GU:  Negative.  NEUROMUSCULAR:  See above.  ENDOCRINE: Negative.  PHYSICAL EXAMINATION:  GENERAL:                      The patient is a well-developed, well-nourished female in no acute distress.  VITAL SIGNS:                  Blood pressure 124/76, pulse 70 and regular, respirations 16 and unlabored.   She is afebrile.  HEENT:                        Within normal limits.  NECK:                         Supple.  Trachea is not enlarged.  Thyroid is not enlarged.  LUNGS:                        Clear to P&A.  HEART:  No thrills, heaves or murmurs.  BREASTS:                      No masses.  ABDOMEN:                      Soft without guarding, rebound or masses.  PELVIC:                       External and vaginal are within normal limits. Cervix is clean.  Uterus is just top normal size and shape, but there is second-degree uterine descensus.  Bladder shows a second-degree cystocele. Adnexa are not palpably enlarged.  RECTAL:                       Vaginal is confirmatory.  EXTREMITIES:                  Within normal limits.  ADMISSION IMPRESSION:         Intrauterine lesion.  PLAN:                         Hysteroscopy and excision of the above. DD:  04/03/00 TD:  04/03/00 Job: [id number]

## 2011-02-28 NOTE — Discharge Summary (Signed)
[name], [name]                          ACCOUNT NO.:  [id number]   MEDICAL RECORD NO.:  [id number]                   PATIENT TYPE:  INP   LOCATION:  0446                                 FACILITY[facility]  [facility]   PHYSICIAN:  Daniel L. Eda Paschal, M.D.           DATE OF BIRTH:  05-25-1934   DATE OF ADMISSION:  11/07/2003  DATE OF DISCHARGE:  11/09/2003                                 DISCHARGE SUMMARY   HOSPITAL COURSE:  The patient is a 77 year old female who entered the  hospital with symptomatic pelvic relaxation including uterine prolapse,  cystocele, and rectocele for definitive surgery.  On the day of admission,  she was taken to the operating room where vaginal hysterectomy, bilateral  salpingo-oophorectomy, and anterior and posterior repair were done.  Postoperatively, she progressed well.  On the second postoperative day, her  Foley was removed and she voided without difficulty.  She was discharged on  Motrin for pain relief.  She will be seen in the office in 3 weeks for  follow up.   Final pathology report revealed uterus with proliferative endometrium,  multiple leiomyoma, right ovary and tube normal, left ovary with benign  ovarian serous cyst.   CONDITION ON DISCHARGE:  Improved.   DISCHARGE DIAGNOSES:  1. Uterine prolapse.  2. Cystocele.  3. Rectocele.  4. Leiomyomata uteri.  5. Benign ovarian serous cyst.   OPERATIONS:  Vaginal hysterectomy, bilateral salpingo-oophorectomy, anterior  and posterior repair.                                               Daniel L. Eda Paschal, M.D.    Tonette Bihari  D:  11/25/2003  T:  11/25/2003  Job:  [id number]

## 2011-02-28 NOTE — Op Note (Signed)
[facility]  Patient:    [name], [name]                       MRN: [id number] Proc. Date: 04/03/00 Adm. Date:  [id number] Disc. Date: [id number] Attending:  Sharon Mt                           Operative Report  PREOPERATIVE DIAGNOSIS:       Intrauterine cavitary defect.  POSTOPERATIVE DIAGNOSIS:      Intrauterine cavitary defect with polyp strongly suspected.  OPERATION:                    Hysteroscopy with resection of intrauterine lesion.  SURGEON:                      Daniel L. Eda Paschal, M.D.  ANESTHESIA:                   IV sedation and paracervical block.  FINDINGS:                     External and vaginal examination revealed a large cystocele.  The cervix was clean.  The uterus was retroverted with third degree uterine descensus.  The uterus itself was not enlarged.  Adnexa were not palpable.  At the time of hysteroscopy, the patient had a large, what I thought was an endometrial polyp emanating from the posterior fundal wall.  It was approximately 3+ cm.  After this had been removed, the rest of the intrauterine cavity, including the tubal ostia, top of the fundus, anterior and posterior walls of the fundus, the lower uterine segment and endocervical canal were all free of disease.  DESCRIPTION OF PROCEDURE:     The patient was taken to the operating room, placed in the dorsal lithotomy position, prepped and draped in the usual sterile manner.  She was given IV sedation by anesthesia and a 20 cc 1% plain Xylocaine paracervical block by Dr. Eda Paschal.  Anesthesia was good.  The cervix was grasped with a single tooth tenaculum at 12 oclock.  The cervix was dilated to a #35 Pratt dilator.  Hysteroscopic examination was done using the hysteroscopic resectoscope attached to a camera.  Sorbitol, 3%, was used to expand the intrauterine cavity and 90 wire loop set at 70 coag, 110 cutting, blend 1 was utilized to resect the polyp.   Pictures of the polyp were taken for documentation and the polyp was then resected.  It had to be removed in four or five fragments.  After being removed, the cavity was carefully examined.  There was no bleeding whatsoever.  The pressure was let down to be sure there was not any bleeding.  therefore, the procedure was terminated at this point after more pictures were taken.  Estimated blood loss for the entire procedure was less than 30 cc with none replaced.  Fluid deficit was less than 50 cc.  The patient left the operating room in satisfactory condition. DD:  04/03/00 TD:  04/06/00 Job: [id number]

## 2011-02-28 NOTE — Op Note (Signed)
[name], MANDEL                          ACCOUNT NO.:  [id number]   MEDICAL RECORD NO.:  [id number]                   PATIENT TYPE:  INP   LOCATION:  X004                                 FACILITY:  [facility]   PHYSICIAN:  Daniel L. Eda Paschal, M.D.           DATE OF BIRTH:  1934-04-03   DATE OF PROCEDURE:  11/07/2003  DATE OF DISCHARGE:                                 OPERATIVE REPORT   PREOPERATIVE DIAGNOSIS:  Uterine prolapse, cystocele, rectocele.   POSTOPERATIVE DIAGNOSIS:  Uterine prolapse, cystocele, rectocele.   OPERATION/PROCEDURE:  1. Vaginal hysterectomy and bilateral salpingo-oophorectomy.  2. Anterior and posterior repair.   SURGEON:  Daniel L. Eda Paschal, M.D.   ANESTHESIA:  General endotracheal anesthesia.   FIRST ASSISTANT:  Timothy P. Fontaine, M.D.   FINDINGS:  At the time of surgery, the patient had second degree uterine  prolapse, second degree cystocele, first degree rectocele.  Ovaries and  tubes were without disease, though there were parovarian adhesions on the  right ovary from the patient's previous ovarian cystectomy.  Pelvic  peritoneum was free of disease.   DESCRIPTION OF PROCEDURE:  After adequate general endotracheal anesthesia,  the patient was placed in the dorsal lithotomy position and prepped and  draped in the usual sterile manner.  A 1:200,000 solution of epinephrine and  0.5% Xylocaine was injected around the cervix.  A 360-degree incision was  made around the cervix.  The bladder was mobilized superiorly as was the  posterior peritoneum.  The posterior peritoneum and vesicouterine fold of  peritoneum were entered by sharp dissection.  Uterosacral ligaments were  clamped.  They were sutured to the vault laterally for good vault support.  They were shortened when they were clamped.  Cardinal ligaments, uterine  arteries and balance of the broad ligament were all clamped, cut and suture  ligated.  #1 chromic catgut was utilized for all the  above-mentioned  pedicles.  The uterus was delivered.  The ovaries and tubes were delivered.  __________ pelvic ligaments were clamped, cut and doubly suture ligated with  #1 chromic catgut as well as the round ligaments.  All tissue was sent to  pathology for tissue diagnosis.  The vaginal cuff was whipstitched with a  running locking 0 Vicryl whipping the cuff to the peritoneum.  A modified  McCall enterocele suture was placed with 2-0 Vicryl.   Attention was next turned to the anterior repair.  There was a small  papillomatous lesion on the vaginal mucosa which was excised and sent to  pathology for tissue diagnosis.  The anterior vaginal mucosa was undermined  up to but not including the urethrovesical angle which was well supported.  The patient had been evaluated by Dr. Retta Diones who did not feel that a  sling was needed.  Foley catheter was inserted.  The perivesical fascia was  sharply dissected free along with the cystocele.  The redundant vaginal  mucosa was trimmed  away and then the cystocele was reduced with  approximately nine interrupted sutures of 2-0 Vicryl, picking up  vesicovaginal fistula with each bite.  The anterior vaginal mucosa was then  closed with a running locking 2-0 Vicryl also incorporating the fascia below  it to eliminate dead space and to resupport the repair of the cystocele.  Following this the cuff was then closed with figure-of-eight #1 chromic  catgut.   Attention was then turned to the rectocele.  The vaginal mucosa was  undermined to the top of the cuff.  The perirectal fascia was sharply  dissected free with the surgeon using a finger in the vagina, and then the  rectocele was reduced with interrupteds of 2-0 Vicryl picking up  rectovaginal tissue.  This was done and the rectocele was completely  eliminated.  There really was not any redundant vaginal mucosa posteriorly  so the vaginal mucosa was __________ closed with a running 2-0 Vicryl, once   again picking up the rectovaginal fascia to eliminate dead space.  The  vagina was packed with one-inch Iodoform.  At the termination of the  procedure there was clear urine draining from the Foley catheter.  Two  sponge, needle and instrument counts were correct.  Estimated blood loss for  the entire procedure was 100 mL with none replaced.                                               Daniel L. Eda Paschal, M.D.    Tonette Bihari  D:  11/07/2003  T:  11/07/2003  Job:  [id number]

## 2011-02-28 NOTE — H&P (Signed)
NAMEFEDRA, [name]                          ACCOUNT NO.:  [id number]   MEDICAL RECORD NO.:  [id number]                   PATIENT TYPE:  INP   LOCATION:  X004                                 [facility]ACILITY:  [facility]   PHYSICIAN:  Daniel L. Eda Paschal, M.D.           DATE OF BIRTH:  1933-11-25   DATE OF ADMISSION:  11/07/2003  DATE OF DISCHARGE:                                HISTORY & PHYSICAL   CHIEF COMPLAINT:  Symptomatic uterine prolapse.   HISTORY OF PRESENT ILLNESS:  The patient is a 77 year old, gravida 2, para  2, AB0, who had presented to my office with symptoms of symptomatic uterine  prolapse. She feels a lot of fullness in her vagina with uterine prolapse.  She has a bulge there. It makes walking difficult. She has no true urinary  stress incontinence. She notices the prolapse much more in the morning,  feels like everything is falling out. She is having a lot of urinary  frequency now. She relates not having any trouble getting stool out. I had  her evaluated by Dr. Retta Diones with urodynamics who did not feel that she  would get urinary stress incontinence after her uterine prolapse, cystocele,  and rectocele were fixed. So she now enters the hospital for vaginal  hysterectomy and anterior and posterior repair for symptomatic uterine  prolapse, cystocele, and rectocele. The patient would like me to remove her  ovaries, if I do it vaginally, but does not want it done abdominally if I  cannot do it vaginally.   PRESENT MEDICATIONS:  ____________, nortriptyline, Synthroid, Nexium,  naproxen, folic acid, methotrexate, and Zocor.   MEDICAL PROBLEMS:  Include rheumatoid arthritis, hypothyroidism, and  hypercholesterolemia.   PREVIOUS SURGERY:  Two ovarian cyst removals in 1962 and 2001. The patient  has had disk surgery in her neck. She has had several foot surgeries. She  has also had a hemorrhoidectomy.   FAMILY HISTORY:  Father is hypertensive. Sister has had coronary  artery  disease. Maternal grandmother has had breast cancer.   REVIEW OF SYSTEMS:  HEENT: Negative. CARDIAC: Elevated blood pressure  responding to ____________. RESPIRATORY: Negative. GI: Reveals a history of  GERD, on Nexium. GU: Negative.  NEUROMUSCULAR: Reveals rheumatoid arthritis.  ENDOCRINE: Reveals hypothyroidism.   PHYSICAL EXAMINATION:  GENERAL: The patient is a well-developed, well-  nourished female in no acute distress.  VITAL SIGNS: Blood pressure 124/74, pulse 80 and regular, respirations 16  and nonlabored. She is afebrile.  HEENT: All within normal limits.  NECK: Supple. Trachea midline. Thyroid not enlarged.  LUNGS: Clear to P&A.  HEART: No thrills, heaves, or murmurs.  BREASTS: No masses.  ABDOMEN: Soft without guarding, rebound, or masses.  PELVIC EXAM: External is normal. BUS is normal. Vaginal examination reveals  a second-degree cystocele, first-degree rectocele. Cervix is clean. Pap  smear shows no atypia. The uterus is retroverted, normal size, and shape  with almost complete prolapse. Adnexa fails  to reveal masses. Anus and  perineum are normal. Digital rectal exam is negative.   ADMISSION IMPRESSION:  Uterine prolapse, cystocele, rectocele.   PLAN:  Surgery as outlined above.                                               Daniel L. Eda Paschal, M.D.    Tonette Bihari  D:  11/07/2003  T:  11/07/2003  Job:  [id number]

## 2011-04-24 ENCOUNTER — Other Ambulatory Visit: Payer: Self-pay | Admitting: Family Medicine

## 2011-04-24 DIAGNOSIS — Z853 Personal history of malignant neoplasm of breast: Secondary | ICD-10-CM

## 2011-05-22 ENCOUNTER — Ambulatory Visit
Admission: RE | Admit: 2011-05-22 | Discharge: 2011-05-22 | Disposition: A | Discharge status: Home / Self Care | Payer: Medicare Other | Source: Ambulatory Visit | Attending: Family Medicine | Admitting: Family Medicine

## 2011-05-22 DIAGNOSIS — Z853 Personal history of malignant neoplasm of breast: Secondary | ICD-10-CM

## 2011-06-10 ENCOUNTER — Other Ambulatory Visit: Payer: Self-pay | Admitting: Oncology

## 2011-06-10 DIAGNOSIS — Z9889 Other specified postprocedural states: Secondary | ICD-10-CM

## 2011-06-10 DIAGNOSIS — Z853 Personal history of malignant neoplasm of breast: Secondary | ICD-10-CM

## 2011-06-19 ENCOUNTER — Other Ambulatory Visit: Payer: Self-pay | Admitting: Oncology

## 2011-06-19 ENCOUNTER — Encounter: Payer: Medicare Other | Admitting: Oncology

## 2011-06-19 DIAGNOSIS — C50419 Malignant neoplasm of upper-outer quadrant of unspecified female breast: Secondary | ICD-10-CM

## 2011-06-19 LAB — CREATININE, SERUM: Creatinine, Ser: 0.99 mg/dL (ref 0.50–1.10)

## 2011-06-20 ENCOUNTER — Ambulatory Visit
Admission: RE | Admit: 2011-06-20 | Discharge: 2011-06-20 | Disposition: A | Discharge status: Home / Self Care | Payer: Medicare Other | Source: Ambulatory Visit | Attending: Oncology | Admitting: Oncology

## 2011-06-20 DIAGNOSIS — Z853 Personal history of malignant neoplasm of breast: Secondary | ICD-10-CM

## 2011-06-20 DIAGNOSIS — Z9889 Other specified postprocedural states: Secondary | ICD-10-CM

## 2011-06-20 MED ORDER — GADOBENATE DIMEGLUMINE 529 MG/ML IV SOLN
10.0000 mL | Freq: Once | INTRAVENOUS | Status: AC | PRN
  Administered 2011-06-20: 10 mL via INTRAVENOUS

## 2011-07-24 LAB — HEMOGLOBIN AND HEMATOCRIT, BLOOD: Hemoglobin: 12.4

## 2011-07-24 LAB — BASIC METABOLIC PANEL
BUN: 11
Chloride: 105
Potassium: 3.8

## 2011-07-25 LAB — DIFFERENTIAL
Eosinophils Absolute: 0.2
Eosinophils Relative: 5
Lymphocytes Relative: 18
Lymphs Abs: 0.8
Monocytes Absolute: 0.6
Monocytes Relative: 12 — ABNORMAL HIGH

## 2011-07-25 LAB — CBC
MCHC: 34.3
MCV: 94.7
Platelets: 198
RBC: 3.74 — ABNORMAL LOW
WBC: 4.7

## 2011-07-25 LAB — COMPREHENSIVE METABOLIC PANEL
ALT: 20
AST: 21
Albumin: 3.6
CO2: 32
Calcium: 9.4
Creatinine, Ser: 0.82
GFR calc Af Amer: >60
GFR calc non Af Amer: >60
Sodium: 142

## 2011-08-14 ENCOUNTER — Encounter (HOSPITAL_BASED_OUTPATIENT_CLINIC_OR_DEPARTMENT_OTHER): Payer: Medicare Other | Admitting: Oncology

## 2011-08-14 ENCOUNTER — Other Ambulatory Visit: Payer: Self-pay | Admitting: Oncology

## 2011-08-14 DIAGNOSIS — Z171 Estrogen receptor negative status [ER-]: Secondary | ICD-10-CM

## 2011-08-14 DIAGNOSIS — C649 Malignant neoplasm of unspecified kidney, except renal pelvis: Secondary | ICD-10-CM

## 2011-08-14 DIAGNOSIS — C50419 Malignant neoplasm of upper-outer quadrant of unspecified female breast: Secondary | ICD-10-CM

## 2011-08-14 DIAGNOSIS — R079 Chest pain, unspecified: Secondary | ICD-10-CM

## 2011-08-14 LAB — CBC WITH DIFFERENTIAL/PLATELET
BASO%: 0.2 % (ref 0.0–2.0)
HCT: 35.5 % (ref 34.8–46.6)
LYMPH%: 13.8 % — ABNORMAL LOW (ref 14.0–49.7)
MCH: 33.4 pg (ref 25.1–34.0)
MCHC: 34.2 g/dL (ref 31.5–36.0)
MCV: 97.8 fL (ref 79.5–101.0)
MONO#: 0.6 10*3/uL (ref 0.1–0.9)
MONO%: 11.2 % (ref 0.0–14.0)
NEUT%: 70.8 % (ref 38.4–76.8)
Platelets: 166 10*3/uL (ref 145–400)
RBC: 3.64 10*6/uL — ABNORMAL LOW (ref 3.70–5.45)
WBC: 5.3 10*3/uL (ref 3.9–10.3)

## 2011-08-14 LAB — COMPREHENSIVE METABOLIC PANEL
ALT: 17 U/L (ref 0–35)
Alkaline Phosphatase: 85 U/L (ref 39–117)
CO2: 24 mEq/L (ref 19–32)
Creatinine, Ser: 1.07 mg/dL (ref 0.50–1.10)
Sodium: 140 mEq/L (ref 135–145)
Total Bilirubin: 0.4 mg/dL (ref 0.3–1.2)

## 2011-08-14 LAB — CANCER ANTIGEN 27.29: CA 27.29: 16 U/mL (ref 0–39)

## 2011-08-15 ENCOUNTER — Other Ambulatory Visit: Payer: Self-pay | Admitting: Physician Assistant

## 2011-08-29 ENCOUNTER — Telehealth: Payer: Self-pay | Admitting: *Deleted

## 2011-08-29 NOTE — Telephone Encounter (Signed)
Pt left message requesting lab results to be mailed to her and forwarded to Dr Shaune Pollack.  This RN will proceed with above.

## 2011-11-17 ENCOUNTER — Other Ambulatory Visit: Payer: Self-pay | Admitting: Oncology

## 2011-11-17 DIAGNOSIS — C801 Malignant (primary) neoplasm, unspecified: Secondary | ICD-10-CM

## 2011-12-08 DIAGNOSIS — M2550 Pain in unspecified joint: Secondary | ICD-10-CM | POA: Diagnosis not present

## 2011-12-08 DIAGNOSIS — Z79899 Other long term (current) drug therapy: Secondary | ICD-10-CM | POA: Diagnosis not present

## 2011-12-08 DIAGNOSIS — M069 Rheumatoid arthritis, unspecified: Secondary | ICD-10-CM | POA: Diagnosis not present

## 2011-12-08 DIAGNOSIS — M1990 Unspecified osteoarthritis, unspecified site: Secondary | ICD-10-CM | POA: Diagnosis not present

## 2012-01-05 DIAGNOSIS — M069 Rheumatoid arthritis, unspecified: Secondary | ICD-10-CM | POA: Diagnosis not present

## 2012-01-05 DIAGNOSIS — Z79899 Other long term (current) drug therapy: Secondary | ICD-10-CM | POA: Diagnosis not present

## 2012-01-15 DIAGNOSIS — H25019 Cortical age-related cataract, unspecified eye: Secondary | ICD-10-CM | POA: Diagnosis not present

## 2012-01-15 DIAGNOSIS — H2510 Age-related nuclear cataract, unspecified eye: Secondary | ICD-10-CM | POA: Diagnosis not present

## 2012-02-05 ENCOUNTER — Other Ambulatory Visit: Payer: Self-pay | Admitting: Urology

## 2012-02-05 DIAGNOSIS — D3000 Benign neoplasm of unspecified kidney: Secondary | ICD-10-CM

## 2012-02-18 ENCOUNTER — Encounter: Payer: Self-pay | Admitting: *Deleted

## 2012-02-18 DIAGNOSIS — M069 Rheumatoid arthritis, unspecified: Secondary | ICD-10-CM | POA: Insufficient documentation

## 2012-02-18 DIAGNOSIS — E039 Hypothyroidism, unspecified: Secondary | ICD-10-CM | POA: Insufficient documentation

## 2012-02-18 DIAGNOSIS — C50919 Malignant neoplasm of unspecified site of unspecified female breast: Secondary | ICD-10-CM | POA: Insufficient documentation

## 2012-02-19 ENCOUNTER — Other Ambulatory Visit: Payer: Self-pay | Admitting: Dermatology

## 2012-02-19 DIAGNOSIS — D239 Other benign neoplasm of skin, unspecified: Secondary | ICD-10-CM | POA: Diagnosis not present

## 2012-02-19 DIAGNOSIS — C44319 Basal cell carcinoma of skin of other parts of face: Secondary | ICD-10-CM | POA: Diagnosis not present

## 2012-02-19 DIAGNOSIS — L570 Actinic keratosis: Secondary | ICD-10-CM | POA: Diagnosis not present

## 2012-03-01 ENCOUNTER — Ambulatory Visit (HOSPITAL_COMMUNITY)
Admission: RE | Admit: 2012-03-01 | Discharge: 2012-03-01 | Disposition: A | Discharge status: Home / Self Care | Payer: Medicare Other | Source: Ambulatory Visit | Attending: Urology | Admitting: Urology

## 2012-03-01 DIAGNOSIS — Z85528 Personal history of other malignant neoplasm of kidney: Secondary | ICD-10-CM | POA: Insufficient documentation

## 2012-03-01 DIAGNOSIS — C50919 Malignant neoplasm of unspecified site of unspecified female breast: Secondary | ICD-10-CM | POA: Diagnosis not present

## 2012-03-01 DIAGNOSIS — D3000 Benign neoplasm of unspecified kidney: Secondary | ICD-10-CM

## 2012-03-01 LAB — CREATININE, SERUM
Creatinine, Ser: 0.98 mg/dL (ref 0.50–1.10)
GFR calc Af Amer: 64 mL/min — ABNORMAL LOW (ref >90)

## 2012-03-01 MED ORDER — GADOBENATE DIMEGLUMINE 529 MG/ML IV SOLN
15.0000 mL | Freq: Once | INTRAVENOUS | Status: AC | PRN
  Administered 2012-03-01: 12 mL via INTRAVENOUS

## 2012-03-05 DIAGNOSIS — M1990 Unspecified osteoarthritis, unspecified site: Secondary | ICD-10-CM | POA: Diagnosis not present

## 2012-03-05 DIAGNOSIS — D3000 Benign neoplasm of unspecified kidney: Secondary | ICD-10-CM | POA: Diagnosis not present

## 2012-03-05 DIAGNOSIS — M2550 Pain in unspecified joint: Secondary | ICD-10-CM | POA: Diagnosis not present

## 2012-03-05 DIAGNOSIS — Z79899 Other long term (current) drug therapy: Secondary | ICD-10-CM | POA: Diagnosis not present

## 2012-03-05 DIAGNOSIS — M069 Rheumatoid arthritis, unspecified: Secondary | ICD-10-CM | POA: Diagnosis not present

## 2012-03-17 DIAGNOSIS — R21 Rash and other nonspecific skin eruption: Secondary | ICD-10-CM | POA: Diagnosis not present

## 2012-03-23 ENCOUNTER — Other Ambulatory Visit: Payer: Self-pay | Admitting: Dermatology

## 2012-03-23 DIAGNOSIS — C44319 Basal cell carcinoma of skin of other parts of face: Secondary | ICD-10-CM | POA: Diagnosis not present

## 2012-04-27 ENCOUNTER — Other Ambulatory Visit: Payer: Self-pay | Admitting: Family Medicine

## 2012-04-27 DIAGNOSIS — Z853 Personal history of malignant neoplasm of breast: Secondary | ICD-10-CM

## 2012-05-14 DIAGNOSIS — E7800 Pure hypercholesterolemia, unspecified: Secondary | ICD-10-CM | POA: Diagnosis not present

## 2012-05-14 DIAGNOSIS — H9190 Unspecified hearing loss, unspecified ear: Secondary | ICD-10-CM | POA: Diagnosis not present

## 2012-05-14 DIAGNOSIS — Z79899 Other long term (current) drug therapy: Secondary | ICD-10-CM | POA: Diagnosis not present

## 2012-05-14 DIAGNOSIS — R7309 Other abnormal glucose: Secondary | ICD-10-CM | POA: Diagnosis not present

## 2012-05-14 DIAGNOSIS — E039 Hypothyroidism, unspecified: Secondary | ICD-10-CM | POA: Diagnosis not present

## 2012-05-14 DIAGNOSIS — M899 Disorder of bone, unspecified: Secondary | ICD-10-CM | POA: Diagnosis not present

## 2012-05-14 DIAGNOSIS — I10 Essential (primary) hypertension: Secondary | ICD-10-CM | POA: Diagnosis not present

## 2012-05-14 DIAGNOSIS — Z0000 Encounter for general adult medical examination without abnormal findings: Secondary | ICD-10-CM | POA: Diagnosis not present

## 2012-05-14 DIAGNOSIS — M949 Disorder of cartilage, unspecified: Secondary | ICD-10-CM | POA: Diagnosis not present

## 2012-05-24 ENCOUNTER — Ambulatory Visit
Admission: RE | Admit: 2012-05-24 | Discharge: 2012-05-24 | Disposition: A | Discharge status: Home / Self Care | Payer: Medicare Other | Source: Ambulatory Visit | Attending: Family Medicine | Admitting: Family Medicine

## 2012-05-24 DIAGNOSIS — H905 Unspecified sensorineural hearing loss: Secondary | ICD-10-CM | POA: Diagnosis not present

## 2012-05-24 DIAGNOSIS — Z853 Personal history of malignant neoplasm of breast: Secondary | ICD-10-CM

## 2012-05-28 ENCOUNTER — Telehealth: Payer: Self-pay | Admitting: Oncology

## 2012-05-28 DIAGNOSIS — M2550 Pain in unspecified joint: Secondary | ICD-10-CM | POA: Diagnosis not present

## 2012-05-28 DIAGNOSIS — Z79899 Other long term (current) drug therapy: Secondary | ICD-10-CM | POA: Diagnosis not present

## 2012-05-28 DIAGNOSIS — M1990 Unspecified osteoarthritis, unspecified site: Secondary | ICD-10-CM | POA: Diagnosis not present

## 2012-05-28 DIAGNOSIS — M069 Rheumatoid arthritis, unspecified: Secondary | ICD-10-CM | POA: Diagnosis not present

## 2012-05-28 NOTE — Telephone Encounter (Signed)
S/w the pt's husband and he is aware of the r/s appt time from 10:00am to 10:45am on 08/16/2012 the same day with the pa

## 2012-06-29 DIAGNOSIS — M899 Disorder of bone, unspecified: Secondary | ICD-10-CM | POA: Diagnosis not present

## 2012-06-29 DIAGNOSIS — M949 Disorder of cartilage, unspecified: Secondary | ICD-10-CM | POA: Diagnosis not present

## 2012-07-08 DIAGNOSIS — Z23 Encounter for immunization: Secondary | ICD-10-CM | POA: Diagnosis not present

## 2012-08-06 ENCOUNTER — Other Ambulatory Visit (HOSPITAL_BASED_OUTPATIENT_CLINIC_OR_DEPARTMENT_OTHER): Payer: Medicare Other | Admitting: Lab

## 2012-08-06 DIAGNOSIS — C50419 Malignant neoplasm of upper-outer quadrant of unspecified female breast: Secondary | ICD-10-CM | POA: Diagnosis not present

## 2012-08-06 DIAGNOSIS — C649 Malignant neoplasm of unspecified kidney, except renal pelvis: Secondary | ICD-10-CM | POA: Diagnosis not present

## 2012-08-06 LAB — CANCER ANTIGEN 27.29: CA 27.29: 17 U/mL (ref 0–39)

## 2012-08-06 LAB — COMPREHENSIVE METABOLIC PANEL (CC13)
ALT: 20 U/L (ref 0–55)
AST: 28 U/L (ref 5–34)
Albumin: 4 g/dL (ref 3.5–5.0)
CO2: 27 mEq/L (ref 22–29)
Calcium: 9.6 mg/dL (ref 8.4–10.4)
Chloride: 103 mEq/L (ref 98–107)
Potassium: 3.8 mEq/L (ref 3.5–5.1)
Total Protein: 6.4 g/dL (ref 6.4–8.3)

## 2012-08-06 LAB — CBC WITH DIFFERENTIAL/PLATELET
BASO%: 0.8 % (ref 0.0–2.0)
EOS%: 5.9 % (ref 0.0–7.0)
HGB: 12.6 g/dL (ref 11.6–15.9)
MCH: 32.6 pg (ref 25.1–34.0)
MCHC: 34 g/dL (ref 31.5–36.0)
MONO#: 0.5 10*3/uL (ref 0.1–0.9)
RDW: 13.7 % (ref 11.2–14.5)
WBC: 4.8 10*3/uL (ref 3.9–10.3)
lymph#: 0.6 10*3/uL — ABNORMAL LOW (ref 0.9–3.3)

## 2012-08-16 ENCOUNTER — Ambulatory Visit (HOSPITAL_BASED_OUTPATIENT_CLINIC_OR_DEPARTMENT_OTHER): Payer: Medicare Other | Admitting: Physician Assistant

## 2012-08-16 ENCOUNTER — Encounter: Payer: Self-pay | Admitting: Physician Assistant

## 2012-08-16 VITALS — BP 128/79 | HR 76 | Temp 97.8°F | Resp 20 | Ht 60.0 in | Wt 130.5 lb

## 2012-08-16 DIAGNOSIS — C50419 Malignant neoplasm of upper-outer quadrant of unspecified female breast: Secondary | ICD-10-CM | POA: Diagnosis not present

## 2012-08-16 DIAGNOSIS — Z171 Estrogen receptor negative status [ER-]: Secondary | ICD-10-CM

## 2012-08-16 DIAGNOSIS — C50919 Malignant neoplasm of unspecified site of unspecified female breast: Secondary | ICD-10-CM

## 2012-08-16 DIAGNOSIS — N289 Disorder of kidney and ureter, unspecified: Secondary | ICD-10-CM

## 2012-08-16 NOTE — Progress Notes (Signed)
ID: [name]   DOB: Nov 02, 1933  MR#: [id number]  WUJ#:[id number]  PCP: Kerry Espy, MD GYN:  SU:  OTHER MD:   HISTORY OF PRESENT ILLNESS: [name] had bilateral mammograms at the [facility] December 21, 2006, which were unremarkable.  Then on May 05, 2007 she felt a stinging in her left breast and when she palpated that area she noted a lump.  She brought it to Dr. Shaune Franco' attention and she was set up for bilateral diagnostic mammograms and [facility].  On physical exam, Dr. Deboraha Franco was able to palpate a subtle area of thickening at 1 o'clock, 12 cm from the left nipple.  By mammography, this showed a spiculated mass in the left upper outer quadrant posteriorly.  There were no other worrisome abnormalities and by ultrasound the mass measured 2.0 cm, was irregularly marginated and solid.  There were no abnormal lymph nodes in the left axilla.  The mass was biopsied on the same day and this showed ([id number] and [id number]) in situ ductal carcinoma with a microscopic focus of stromal invasion, ER and PR were negative.  With this information the patient was referred to Dr. Earlene Franco and bilateral breast MRIs were obtained May 20, 2007.  These showed in the upper outer quadrant of the left breast an enhancing mass measuring up to 2.3 cm.  This was felt to be consistent with malignancy.  There were no other findings in the right breast or axilla and accordingly, after appropriate discussion, Dr. Earlene Franco proceeded to left partial mastectomy with sentinel lymph node biopsy on June 02, 2007.  The final pathology ([id number]) showed a 2.2 cm infiltrating ductal carcinoma, grade 3, with no evidence of vascular invasion and negative margins.  The single sentinel lymph node was negative.  Breast prognostic profile performed on this invasive tumor showed (by report today at the breast conference) the cells to be ER and PR negative, but HER-2/neu  positive with a Ki-67 percentage of 27.  INTERVAL HISTORY: [name] returns today accompanied by her husband [name] for routine one-year followup of her left breast carcinoma. Interval history is generally unremarkable, and [name] is feeling well. I will mention that [name] underwent treatment 2 years ago under the care of Dr. Shirline Franco for stage IV melanoma and is "still doing great".  [name] is thrilled to tell me that this is the first year in many that neither of them have needed surgery!  REVIEW OF SYSTEMS: [name] is feeling well. She has had no illnesses and denies fevers, chills, night sweats, or unexplained weight loss. She's eating and drinking well with no nausea or change in bowel habits. She does have some reflux, and recently started on Fosamax which is being followed by Dr. Kevan Franco. [name] denies any increased shortness of breath, new cough, or chest pain. She has had no abnormal headaches or dizziness, and denies any unusual myalgias, arthralgias, or bony pain. Of course she does have some chronic pain, especially affecting the feet and shoulders, secondary to rheumatoid arthritis.  A detailed review of systems is otherwise stable and noncontributory.   PAST MEDICAL HISTORY: Past Medical History  Diagnosis Date  . Rheumatoid arthritis   . Hypothyroidism   . Breast cancer   . UTI (lower urinary tract infection)   . Diverticulitis   She had a total hysterectomy with bilateral salpingo-oophorectomy together with bladder suspension in 2005.  She had a hemorrhoidectomy in 2001.  She had epidural shots under  Dr. Jeral Franco for back pain and she had a cervical laminectomy by him at some point in the past.  She has a history of rheumatoid arthritis and she receives six tablets of methotrexate weekly under Dr. Coral Franco, is recurrently being held.  There is a history of GERD.  She has a history of tachycardia, history of osteoarthritis, history of tonsillectomy and adenoidectomy.  She has a history of bilateral  foot surgery for Morton's neuromas and a history of hypercholesterolemia.  She also has a history of diabetes.  PAST SURGICAL HISTORY: Past Surgical History  Procedure Date  . Tonsillectomy   . Ovarian cyst removal   . Breast lumpectomy   . Abdominal surgery     for ruptured diverticuli  . Cervical spine surgery   . Bilateral foot surgery     FAMILY HISTORY Family History  Problem Relation Age of Onset  . Heart disease Mother   . Heart disease Father   The patient's father died from a myocardial infarction at the age of 43.  The patient's mother died at the age of 12 with congestive heart failure.  She also had a diagnosis of "stomach cancer."  The patient has one brother and two sisters.  There is no cancer in the immediate family, but the patient's maternal grandmother died with breast cancer at the age of 16 and one of that maternal grandmother's six daughters namely one of her mother's five sisters had breast cancer as well.  There is no history of ovarian cancer in the family.  GYNECOLOGIC HISTORY:  She is GX, P2, change of life in her late 57s.  She never took hormones.  First pregnancy was age 9.   SOCIAL HISTORY:  [name] used to work for [employer].  She also worked at [employer] as a Diplomatic Services operational officer.  Her husband of 38 years [name] used to work for [employer], currently he volunteers at the cardiology rehab center at [employer].  They have a daughter [name] in [place] who is 51 years old and a son [name] who lives in [place] and teaches creative writing at [employer].  The patient has no grandchildren.  She attends [employer].   ADVANCED DIRECTIVES:  HEALTH MAINTENANCE: History  Substance Use Topics  . Smoking status: Never Smoker   . Smokeless tobacco: Not on file  . Alcohol Use: No     Colonoscopy:  PAP:  Bone density:  Lipid panel:  Allergies  Allergen Reactions  . Dilaudid (Hydromorphone Hcl) Nausea And Vomiting  . Morphine And Related   . Phenergan  (Promethazine Hcl)   . Tetanus Toxoids     Current Outpatient Prescriptions  Medication Sig Dispense Refill  . acetaminophen-codeine (TYLENOL #3) 300-30 MG per tablet Take 1 tablet by mouth every 4 (four) hours as needed.      Marland Kitchen alendronate (FOSAMAX) 70 MG tablet       . ALPRAZolam (XANAX) 0.25 MG tablet Take 0.25 mg by mouth daily as needed.      . calcium carbonate 1250 MG capsule Take 1,250 mg by mouth daily.      . cholecalciferol (VITAMIN D) 1000 UNITS tablet Take 1,000 Units by mouth daily.      . diazepam (VALIUM) 5 MG tablet Take 5 mg by mouth every 6 (six) hours as needed.      . dicyclomine (BENTYL) 10 MG capsule       . fish oil-omega-3 fatty acids 1000 MG capsule Take 1 g by mouth daily.      Marland Kitchen  fluticasone (FLONASE) 50 MCG/ACT nasal spray       . folic acid (FOLVITE) 1 MG tablet Take 3 mg by mouth daily.       Marland Kitchen levothyroxine (SYNTHROID, LEVOTHROID) 175 MCG tablet Take 175 mcg by mouth daily.      . Melatonin 2.5 MG CAPS Take by mouth.      . methotrexate (RHEUMATREX) 15 MG tablet Inject 0.6 mg as directed once a week. Caution: Chemotherapy. Protect from light.      . Multiple Vitamin (MULTIVITAMIN) capsule Take 1 capsule by mouth daily.      . naproxen (NAPROSYN) 375 MG tablet Take 500 mg by mouth daily.       Marland Kitchen omeprazole (PRILOSEC) 20 MG capsule Take 20 mg by mouth daily.      . potassium chloride SA (K-DUR,KLOR-CON) 20 MEQ tablet TAKE 1 TABLET DAILY  90 tablet  6  . simvastatin (ZOCOR) 20 MG tablet Take 20 mg by mouth every evening.      . zolpidem (AMBIEN) 5 MG tablet Take 2.5 mg by mouth at bedtime as needed.         OBJECTIVE: Elderly white female who appears comfortable and in no acute distress. Filed Vitals:   08/16/12 1042  BP: 128/79  Pulse: 76  Temp: 97.8 F (36.6 C)  Resp: 20     Body mass index is 25.49 kg/(m^2).    ECOG FS: 0 Filed Weights   08/16/12 1042  Weight: 130 lb 8 oz (59.194 kg)   Sclerae unicteric Oropharynx clear No cervical or  supraclavicular adenopathy Lungs no rales or rhonchi Heart regular rate and rhythm Abd soft, nontender, with positive bowel sounds MSK no focal spinal tenderness, no peripheral edema Neuro: nonfocal, alert and oriented x3 Breasts: Right breast is unremarkable. Left breast is status post lumpectomy with no evidence of local recurrence. Axillae are benign bilaterally, no adenopathy palpated   LAB RESULTS: Lab Results  Component Value Date   WBC 4.8 08/06/2012   NEUTROABS 3.3 08/06/2012   HGB 12.6 08/06/2012   HCT 37.1 08/06/2012   MCV 96.0 08/06/2012   PLT 174 08/06/2012      Chemistry      Component Value Date/Time   NA 138 08/06/2012 0937   NA 140 08/14/2011 1326   NA 140 08/14/2011 1326   K 3.8 08/06/2012 0937   K 4.1 08/14/2011 1326   K 4.1 08/14/2011 1326   CL 103 08/06/2012 0937   CL 101 08/14/2011 1326   CL 101 08/14/2011 1326   CO2 27 08/06/2012 0937   CO2 24 08/14/2011 1326   CO2 24 08/14/2011 1326   BUN 21.0 08/06/2012 0937   BUN 21 08/14/2011 1326   BUN 21 08/14/2011 1326   CREATININE 0.8 08/06/2012 0937   CREATININE 0.98 03/01/2012 1054      Component Value Date/Time   CALCIUM 9.6 08/06/2012 0937   CALCIUM 9.6 08/14/2011 1326   CALCIUM 9.6 08/14/2011 1326   ALKPHOS 91 08/06/2012 0937   ALKPHOS 85 08/14/2011 1326   ALKPHOS 85 08/14/2011 1326   AST 28 08/06/2012 0937   AST 25 08/14/2011 1326   AST 25 08/14/2011 1326   ALT 20 08/06/2012 0937   ALT 17 08/14/2011 1326   ALT 17 08/14/2011 1326   BILITOT 0.50 08/06/2012 0937   BILITOT 0.4 08/14/2011 1326   BILITOT 0.4 08/14/2011 1326       Lab Results  Component Value Date   LABCA2 17 08/06/2012  STUDIES: Most recent mammogram on 05/24/2012 was unremarkable.   ASSESSMENT: 78 y.o.  [place] woman status post: 1. Left lumpectomy and sentinel lymph node dissection in August of 2008 for a T2 N0 grade 3 invasive ductal carcinoma, ER/PR negative, HER2/neu positive with MIB-1 of 27%.  Treated with weekly paclitaxel  times 7 along with trastuzumab, then followed by trastuzumab given on a q.3 week basis and continued until March of 2009.  Now followed with observation alone. 2. Bilateral renal lesions, status post radiofrequency ablation to the ones on the right, status post partial nephrectomy on the left with no complications.    PLAN: [name] appears to be doing very well with regards to her breast cancer, and is now at her 5 year anniversary. According to Dr. Darnelle Catalan prior plan, we will release her from followup, but of course her glad to see her if she has any changes or problems. I encouraged her to continue close followup with Dr. Kevan Franco. Of course she will also need annual mammograms as before.  [name] voices understanding and agreement with this plan, and will call with any changes or problems.  [name]    08/16/2012

## 2012-08-16 NOTE — Patient Instructions (Signed)
Congratulations on completing 5 years of follow up !  Call us with any problems or questions   [phone]  Continue annual mammogram and routine follow ups with primary care physician

## 2012-08-25 DIAGNOSIS — M2550 Pain in unspecified joint: Secondary | ICD-10-CM | POA: Diagnosis not present

## 2012-08-25 DIAGNOSIS — M1990 Unspecified osteoarthritis, unspecified site: Secondary | ICD-10-CM | POA: Diagnosis not present

## 2012-08-25 DIAGNOSIS — M069 Rheumatoid arthritis, unspecified: Secondary | ICD-10-CM | POA: Diagnosis not present

## 2012-08-25 DIAGNOSIS — Z79899 Other long term (current) drug therapy: Secondary | ICD-10-CM | POA: Diagnosis not present

## 2012-09-22 DIAGNOSIS — J069 Acute upper respiratory infection, unspecified: Secondary | ICD-10-CM | POA: Diagnosis not present

## 2012-11-05 DIAGNOSIS — L821 Other seborrheic keratosis: Secondary | ICD-10-CM | POA: Diagnosis not present

## 2012-11-05 DIAGNOSIS — Z85828 Personal history of other malignant neoplasm of skin: Secondary | ICD-10-CM | POA: Diagnosis not present

## 2012-11-05 DIAGNOSIS — L570 Actinic keratosis: Secondary | ICD-10-CM | POA: Diagnosis not present

## 2012-11-18 DIAGNOSIS — E7800 Pure hypercholesterolemia, unspecified: Secondary | ICD-10-CM | POA: Diagnosis not present

## 2012-11-18 DIAGNOSIS — R079 Chest pain, unspecified: Secondary | ICD-10-CM | POA: Diagnosis not present

## 2012-11-18 DIAGNOSIS — E039 Hypothyroidism, unspecified: Secondary | ICD-10-CM | POA: Diagnosis not present

## 2012-11-18 DIAGNOSIS — I10 Essential (primary) hypertension: Secondary | ICD-10-CM | POA: Diagnosis not present

## 2012-11-18 DIAGNOSIS — Z79899 Other long term (current) drug therapy: Secondary | ICD-10-CM | POA: Diagnosis not present

## 2012-11-18 DIAGNOSIS — K219 Gastro-esophageal reflux disease without esophagitis: Secondary | ICD-10-CM | POA: Diagnosis not present

## 2012-12-31 DIAGNOSIS — M069 Rheumatoid arthritis, unspecified: Secondary | ICD-10-CM | POA: Diagnosis not present

## 2012-12-31 DIAGNOSIS — M1990 Unspecified osteoarthritis, unspecified site: Secondary | ICD-10-CM | POA: Diagnosis not present

## 2012-12-31 DIAGNOSIS — M2550 Pain in unspecified joint: Secondary | ICD-10-CM | POA: Diagnosis not present

## 2012-12-31 DIAGNOSIS — Z79899 Other long term (current) drug therapy: Secondary | ICD-10-CM | POA: Diagnosis not present

## 2013-01-10 DIAGNOSIS — J342 Deviated nasal septum: Secondary | ICD-10-CM | POA: Diagnosis not present

## 2013-01-10 DIAGNOSIS — R040 Epistaxis: Secondary | ICD-10-CM | POA: Diagnosis not present

## 2013-01-10 DIAGNOSIS — J310 Chronic rhinitis: Secondary | ICD-10-CM | POA: Diagnosis not present

## 2013-01-18 DIAGNOSIS — H2510 Age-related nuclear cataract, unspecified eye: Secondary | ICD-10-CM | POA: Diagnosis not present

## 2013-01-27 DIAGNOSIS — H2510 Age-related nuclear cataract, unspecified eye: Secondary | ICD-10-CM | POA: Diagnosis not present

## 2013-02-17 DIAGNOSIS — L570 Actinic keratosis: Secondary | ICD-10-CM | POA: Diagnosis not present

## 2013-02-17 DIAGNOSIS — L820 Inflamed seborrheic keratosis: Secondary | ICD-10-CM | POA: Diagnosis not present

## 2013-02-17 DIAGNOSIS — D1801 Hemangioma of skin and subcutaneous tissue: Secondary | ICD-10-CM | POA: Diagnosis not present

## 2013-02-17 DIAGNOSIS — Z85828 Personal history of other malignant neoplasm of skin: Secondary | ICD-10-CM | POA: Diagnosis not present

## 2013-02-17 DIAGNOSIS — L819 Disorder of pigmentation, unspecified: Secondary | ICD-10-CM | POA: Diagnosis not present

## 2013-02-17 DIAGNOSIS — L821 Other seborrheic keratosis: Secondary | ICD-10-CM | POA: Diagnosis not present

## 2013-02-17 DIAGNOSIS — L909 Atrophic disorder of skin, unspecified: Secondary | ICD-10-CM | POA: Diagnosis not present

## 2013-02-22 DIAGNOSIS — H2510 Age-related nuclear cataract, unspecified eye: Secondary | ICD-10-CM | POA: Diagnosis not present

## 2013-02-22 DIAGNOSIS — H2589 Other age-related cataract: Secondary | ICD-10-CM | POA: Diagnosis not present

## 2013-03-09 DIAGNOSIS — D3000 Benign neoplasm of unspecified kidney: Secondary | ICD-10-CM | POA: Diagnosis not present

## 2013-03-16 DIAGNOSIS — L259 Unspecified contact dermatitis, unspecified cause: Secondary | ICD-10-CM | POA: Diagnosis not present

## 2013-03-22 DIAGNOSIS — H2589 Other age-related cataract: Secondary | ICD-10-CM | POA: Diagnosis not present

## 2013-03-22 DIAGNOSIS — H2510 Age-related nuclear cataract, unspecified eye: Secondary | ICD-10-CM | POA: Diagnosis not present

## 2013-03-28 DIAGNOSIS — M069 Rheumatoid arthritis, unspecified: Secondary | ICD-10-CM | POA: Diagnosis not present

## 2013-03-28 DIAGNOSIS — M2550 Pain in unspecified joint: Secondary | ICD-10-CM | POA: Diagnosis not present

## 2013-03-28 DIAGNOSIS — Z79899 Other long term (current) drug therapy: Secondary | ICD-10-CM | POA: Diagnosis not present

## 2013-04-07 DIAGNOSIS — M216X9 Other acquired deformities of unspecified foot: Secondary | ICD-10-CM | POA: Diagnosis not present

## 2013-04-07 DIAGNOSIS — Q828 Other specified congenital malformations of skin: Secondary | ICD-10-CM | POA: Diagnosis not present

## 2013-04-07 DIAGNOSIS — M2010 Hallux valgus (acquired), unspecified foot: Secondary | ICD-10-CM | POA: Diagnosis not present

## 2013-04-07 DIAGNOSIS — M0500 Felty's syndrome, unspecified site: Secondary | ICD-10-CM | POA: Diagnosis not present

## 2013-04-25 DIAGNOSIS — M069 Rheumatoid arthritis, unspecified: Secondary | ICD-10-CM | POA: Diagnosis not present

## 2013-05-09 ENCOUNTER — Other Ambulatory Visit: Payer: Self-pay | Admitting: Family Medicine

## 2013-05-09 DIAGNOSIS — Z853 Personal history of malignant neoplasm of breast: Secondary | ICD-10-CM

## 2013-05-18 DIAGNOSIS — E7800 Pure hypercholesterolemia, unspecified: Secondary | ICD-10-CM | POA: Diagnosis not present

## 2013-05-18 DIAGNOSIS — R7309 Other abnormal glucose: Secondary | ICD-10-CM | POA: Diagnosis not present

## 2013-05-18 DIAGNOSIS — Z79899 Other long term (current) drug therapy: Secondary | ICD-10-CM | POA: Diagnosis not present

## 2013-05-18 DIAGNOSIS — I10 Essential (primary) hypertension: Secondary | ICD-10-CM | POA: Diagnosis not present

## 2013-05-18 DIAGNOSIS — Z0000 Encounter for general adult medical examination without abnormal findings: Secondary | ICD-10-CM | POA: Diagnosis not present

## 2013-05-18 DIAGNOSIS — E039 Hypothyroidism, unspecified: Secondary | ICD-10-CM | POA: Diagnosis not present

## 2013-05-19 DIAGNOSIS — H04129 Dry eye syndrome of unspecified lacrimal gland: Secondary | ICD-10-CM | POA: Diagnosis not present

## 2013-05-20 ENCOUNTER — Other Ambulatory Visit: Payer: Self-pay | Admitting: Dermatology

## 2013-05-20 DIAGNOSIS — L821 Other seborrheic keratosis: Secondary | ICD-10-CM | POA: Diagnosis not present

## 2013-05-20 DIAGNOSIS — L820 Inflamed seborrheic keratosis: Secondary | ICD-10-CM | POA: Diagnosis not present

## 2013-05-20 DIAGNOSIS — Z85828 Personal history of other malignant neoplasm of skin: Secondary | ICD-10-CM | POA: Diagnosis not present

## 2013-05-25 ENCOUNTER — Ambulatory Visit
Admission: RE | Admit: 2013-05-25 | Discharge: 2013-05-25 | Disposition: A | Discharge status: Home / Self Care | Payer: Medicare Other | Source: Ambulatory Visit | Attending: Family Medicine | Admitting: Family Medicine

## 2013-05-25 DIAGNOSIS — Z853 Personal history of malignant neoplasm of breast: Secondary | ICD-10-CM | POA: Diagnosis not present

## 2013-05-26 DIAGNOSIS — M79609 Pain in unspecified limb: Secondary | ICD-10-CM | POA: Diagnosis not present

## 2013-05-26 DIAGNOSIS — M069 Rheumatoid arthritis, unspecified: Secondary | ICD-10-CM | POA: Diagnosis not present

## 2013-06-22 DIAGNOSIS — L84 Corns and callosities: Secondary | ICD-10-CM | POA: Diagnosis not present

## 2013-06-28 DIAGNOSIS — M069 Rheumatoid arthritis, unspecified: Secondary | ICD-10-CM | POA: Diagnosis not present

## 2013-06-28 DIAGNOSIS — Z23 Encounter for immunization: Secondary | ICD-10-CM | POA: Diagnosis not present

## 2013-06-28 DIAGNOSIS — Z79899 Other long term (current) drug therapy: Secondary | ICD-10-CM | POA: Diagnosis not present

## 2013-06-28 DIAGNOSIS — M2550 Pain in unspecified joint: Secondary | ICD-10-CM | POA: Diagnosis not present

## 2013-08-17 ENCOUNTER — Ambulatory Visit: Payer: Self-pay | Admitting: Podiatry

## 2013-08-17 DIAGNOSIS — L03019 Cellulitis of unspecified finger: Secondary | ICD-10-CM | POA: Diagnosis not present

## 2013-08-17 DIAGNOSIS — IMO0002 Reserved for concepts with insufficient information to code with codable children: Secondary | ICD-10-CM | POA: Diagnosis not present

## 2013-08-23 DIAGNOSIS — M62838 Other muscle spasm: Secondary | ICD-10-CM | POA: Diagnosis not present

## 2013-09-01 ENCOUNTER — Ambulatory Visit (INDEPENDENT_AMBULATORY_CARE_PROVIDER_SITE_OTHER): Payer: Medicare Other | Admitting: Podiatry

## 2013-09-01 ENCOUNTER — Encounter: Payer: Self-pay | Admitting: Podiatry

## 2013-09-01 VITALS — BP 148/96 | HR 67 | Resp 12

## 2013-09-01 DIAGNOSIS — L84 Corns and callosities: Secondary | ICD-10-CM | POA: Diagnosis not present

## 2013-09-02 NOTE — Progress Notes (Signed)
Subjective:     Patient ID: [name], female   DOB: [birth year], 79 y.o.   MRN: [id number]  HPI patient presents with painful calluses on the bottom of both feet   Review of Systems     Objective:   Physical Exam Neurovascular status intact. Patient has thick plantar calluses plantar aspect both feet that are painful    Assessment:     Keratosis secondary to bone pressure both feet    Plan:     Debridement painful calluses both feet

## 2013-09-19 DIAGNOSIS — E039 Hypothyroidism, unspecified: Secondary | ICD-10-CM | POA: Diagnosis not present

## 2013-09-27 DIAGNOSIS — M069 Rheumatoid arthritis, unspecified: Secondary | ICD-10-CM | POA: Diagnosis not present

## 2013-09-27 DIAGNOSIS — M2550 Pain in unspecified joint: Secondary | ICD-10-CM | POA: Diagnosis not present

## 2013-09-27 DIAGNOSIS — Z79899 Other long term (current) drug therapy: Secondary | ICD-10-CM | POA: Diagnosis not present

## 2013-09-30 ENCOUNTER — Encounter: Payer: Self-pay | Admitting: Cardiology

## 2013-09-30 DIAGNOSIS — B9789 Other viral agents as the cause of diseases classified elsewhere: Secondary | ICD-10-CM | POA: Diagnosis not present

## 2013-09-30 DIAGNOSIS — R002 Palpitations: Secondary | ICD-10-CM | POA: Diagnosis not present

## 2013-11-10 ENCOUNTER — Ambulatory Visit: Payer: Medicare Other | Admitting: Podiatry

## 2013-11-17 ENCOUNTER — Other Ambulatory Visit: Payer: Self-pay | Admitting: Family Medicine

## 2013-11-17 ENCOUNTER — Ambulatory Visit
Admission: RE | Admit: 2013-11-17 | Discharge: 2013-11-17 | Disposition: A | Discharge status: Home / Self Care | Payer: Medicare Other | Source: Ambulatory Visit | Attending: Family Medicine | Admitting: Family Medicine

## 2013-11-17 DIAGNOSIS — R0602 Shortness of breath: Secondary | ICD-10-CM

## 2013-11-17 DIAGNOSIS — R0989 Other specified symptoms and signs involving the circulatory and respiratory systems: Secondary | ICD-10-CM | POA: Diagnosis not present

## 2013-11-17 DIAGNOSIS — R0609 Other forms of dyspnea: Secondary | ICD-10-CM | POA: Diagnosis not present

## 2013-11-17 DIAGNOSIS — F411 Generalized anxiety disorder: Secondary | ICD-10-CM | POA: Diagnosis not present

## 2013-11-17 DIAGNOSIS — E039 Hypothyroidism, unspecified: Secondary | ICD-10-CM | POA: Diagnosis not present

## 2013-11-17 DIAGNOSIS — R7309 Other abnormal glucose: Secondary | ICD-10-CM | POA: Diagnosis not present

## 2013-11-17 DIAGNOSIS — E7800 Pure hypercholesterolemia, unspecified: Secondary | ICD-10-CM | POA: Diagnosis not present

## 2013-11-17 DIAGNOSIS — I10 Essential (primary) hypertension: Secondary | ICD-10-CM | POA: Diagnosis not present

## 2013-11-22 ENCOUNTER — Ambulatory Visit (INDEPENDENT_AMBULATORY_CARE_PROVIDER_SITE_OTHER): Payer: Medicare Other

## 2013-11-22 VITALS — BP 140/74 | HR 78 | Resp 16

## 2013-11-22 DIAGNOSIS — L84 Corns and callosities: Secondary | ICD-10-CM

## 2013-11-22 DIAGNOSIS — M069 Rheumatoid arthritis, unspecified: Secondary | ICD-10-CM | POA: Diagnosis not present

## 2013-11-22 NOTE — Patient Instructions (Signed)

## 2013-11-22 NOTE — Progress Notes (Signed)
   Subjective:    Patient ID: [name], female    DOB: 1933/12/20, 80 y.o.   MRN: [id number]  HPI Comments: "Well, I need the calluses trimmed off these rheumatoid arthritic feet"     Review of Systems no new findings or changes noted     Objective:   Physical Exam Vascular status is intact pedal pulses palpable DP postal for PT plus one over 4 bilateral Refill timed 3-4 seconds all digits skin temperature warm turgor normal no edema rubor pallor noted mild varicosities noted or logically epicritic and proprioceptive sensations intact and symmetric bilateral. There is normal plantar response and DTRs. Dermatologically skin color pigment normal hair growth is absent nails somewhat criptotic otherwise unremarkable neurologically there is keratoses subsecond third metatarsals bilateral with hemorrhage a keratoses secondary plantarflexed metatarsal and digital contractures and rheumatoid arthropathy patient has atrophy the plantar fat pad noted as well there is no open wounds or ulcerations mild hemorrhage a keratosis noted. Patient's rheumatoid requesting debridement is becoming every several months been treated and asked by Dr. Paulla Dolly.       Assessment & Plan:  Assessment porokeratosis/corn callus secondary plantarflexed metatarsal to plantar this time keratotic lesion is debrided with some pinpoint bleeding treated with lumicain Neosporin and Band-Aid bilateral feet. Maintain accommodative insoles which she currently is wearing followup in 10 weeks as recommended keratoses debrided likely noncovered service as patient is having classic findings no history of diabetes or peripheral neuropathy findings.  Purchase Tamakia Porto DPM

## 2013-11-23 DIAGNOSIS — H04129 Dry eye syndrome of unspecified lacrimal gland: Secondary | ICD-10-CM | POA: Diagnosis not present

## 2013-11-23 DIAGNOSIS — Z961 Presence of intraocular lens: Secondary | ICD-10-CM | POA: Diagnosis not present

## 2013-12-11 DIAGNOSIS — R9439 Abnormal result of other cardiovascular function study: Secondary | ICD-10-CM

## 2013-12-11 HISTORY — DX: Abnormal result of other cardiovascular function study: R94.39

## 2013-12-14 DIAGNOSIS — E039 Hypothyroidism, unspecified: Secondary | ICD-10-CM | POA: Diagnosis not present

## 2013-12-14 DIAGNOSIS — L659 Nonscarring hair loss, unspecified: Secondary | ICD-10-CM | POA: Diagnosis not present

## 2013-12-30 ENCOUNTER — Encounter: Payer: Self-pay | Admitting: Cardiology

## 2013-12-30 ENCOUNTER — Ambulatory Visit (INDEPENDENT_AMBULATORY_CARE_PROVIDER_SITE_OTHER): Payer: Medicare Other | Admitting: Cardiology

## 2013-12-30 VITALS — BP 136/82 | HR 60 | Ht 60.0 in | Wt 132.0 lb

## 2013-12-30 DIAGNOSIS — E785 Hyperlipidemia, unspecified: Secondary | ICD-10-CM | POA: Diagnosis not present

## 2013-12-30 DIAGNOSIS — R0609 Other forms of dyspnea: Secondary | ICD-10-CM | POA: Diagnosis not present

## 2013-12-30 DIAGNOSIS — R079 Chest pain, unspecified: Secondary | ICD-10-CM

## 2013-12-30 DIAGNOSIS — I10 Essential (primary) hypertension: Secondary | ICD-10-CM | POA: Insufficient documentation

## 2013-12-30 DIAGNOSIS — R0989 Other specified symptoms and signs involving the circulatory and respiratory systems: Secondary | ICD-10-CM

## 2013-12-30 NOTE — Patient Instructions (Signed)
We will schedule you for a nuclear stress test   

## 2013-12-31 NOTE — Progress Notes (Signed)
[name] Date of Birth: [birth year] Medical Record #[id number]  History of Present Illness: [name] is seen at the request of Dr. Inda Merlin for evaluation of dyspnea. I apparently saw [name] several years ago but those records are not available at this time. She presents with symptoms of worsening dyspnea on exertion. She states she has had SOB for 2 years off and on. More recently her symptoms have progressed where now she has to stop and catch her breath after walking one quarter mile. She also gets SOB climbing stairs. Her symptoms are accompanied by a burning sensation in her chest that is relieved with rest. She does have a history of RA and is on methotrexate. She also has a history of HTN and hyperlipidemia. Her father died at age 53 with an MI. Recent CXR was apparently unremarkable.   Current Outpatient Prescriptions on File Prior to Visit  Medication Sig Dispense Refill  . acetaminophen-codeine (TYLENOL #3) 300-30 MG per tablet Take 1 tablet by mouth every 4 (four) hours as needed.      . ALPRAZolam (XANAX) 0.25 MG tablet Take 0.25 mg by mouth daily as needed.      . calcium carbonate 1250 MG capsule Take 1,250 mg by mouth daily.      . cholecalciferol (VITAMIN D) 1000 UNITS tablet Take 1,000 Units by mouth daily.      Marland Kitchen dicyclomine (BENTYL) 10 MG capsule       . fish oil-omega-3 fatty acids 1000 MG capsule Take 1 g by mouth daily.      . folic acid (FOLVITE) 1 MG tablet Take 2 mg by mouth daily.       Marland Kitchen levothyroxine (SYNTHROID, LEVOTHROID) 175 MCG tablet Take 150 mcg by mouth daily.       . Melatonin 2.5 MG CAPS Take by mouth.      . methotrexate (RHEUMATREX) 15 MG tablet Inject 0.8 mg as directed once a week. Caution: Chemotherapy. Protect from light.      . Multiple Vitamin (MULTIVITAMIN) capsule Take 1 capsule by mouth daily.      . potassium chloride SA (K-DUR,KLOR-CON) 20 MEQ tablet TAKE 1 TABLET DAILY  90 tablet  6  . simvastatin (ZOCOR) 20 MG tablet Take 20 mg by  mouth every evening.       No current facility-administered medications on file prior to visit.    Allergies  Allergen Reactions  . Dilaudid [Hydromorphone Hcl] Nausea And Vomiting  . Morphine And Related   . Phenergan [Promethazine Hcl]   . Tetanus Toxoids     Past Medical History  Diagnosis Date  . Rheumatoid arthritis(714.0)   . Hypothyroidism   . Breast cancer 2008    RT, chemo, lumpectomy  . UTI (lower urinary tract infection)   . Diverticulitis   . Kidney tumor 2010    RFA  . HTN (hypertension)   . Hyperlipidemia   . DVT (deep vein thrombosis) in pregnancy     site of PICC catheter  . Prediabetes     Past Surgical History  Procedure Laterality Date  . Tonsillectomy    . Ovarian cyst removal    . Breast lumpectomy    . Abdominal surgery      for ruptured diverticuli  . Cervical spine surgery    . Bilateral foot surgery    . Renal rfa    . Perforation of colon    . Take down of colonostomy  2011    History  Smoking  status  . Never Smoker   Smokeless tobacco  . Not on file    History  Alcohol Use No    Family History  Problem Relation Age of Onset  . Heart disease Mother   . Heart disease Father     Review of Systems: The review of systems is positive for arthralgias. She does try and walk one mile per day.  All other systems were reviewed and are negative.  Physical Exam: BP 136/82  Pulse 60  Ht 5' (1.524 m)  Wt 132 lb (59.875 kg)  BMI 25.78 kg/m2 She is a pleasant WF in NAD. HEENT: [place]/AT, PERRLA, EOMI, sclera are clear. Oropharynx is clear.  Neck: no JVD, adenopathy, thyromegaly, or bruits. Lungs: clear CV: RRR, normal S1-2, normal PMI. No gallop or murmur. Abd: soft and NT, no masses or HSM. BS + Ext: no edema or cyanosis. Pulses 2+ Skin: warm and dry Neuro: alert and oriented x 3. Nonfocal.   LABORATORY DATA: Cholesterol- 139, trig-58, LDL-67, HDL-60. TSH .63, A1c 6%. Other chemistries and CBC are normal.  Ecg: NSR rate 113.  Nonspecific TWA.  Assessment / Plan: 1. Dyspnea and chest pain on exertion. Concerning for angina pectoris. Multiple risk factors including HTN, hyperlipidemia, RA, and family history of premature CAD. Will arrange stress Myoview to further stratify her risk. If abnormal may need cardiac cath. Other possibility includes rheumatoid lung disease but less likely with normal CXR.  2. RA  3. HTN  4. Hyperlipidemia- well controlled

## 2014-01-04 DIAGNOSIS — M069 Rheumatoid arthritis, unspecified: Secondary | ICD-10-CM | POA: Diagnosis not present

## 2014-01-04 DIAGNOSIS — M2550 Pain in unspecified joint: Secondary | ICD-10-CM | POA: Diagnosis not present

## 2014-01-04 DIAGNOSIS — Z79899 Other long term (current) drug therapy: Secondary | ICD-10-CM | POA: Diagnosis not present

## 2014-01-10 ENCOUNTER — Encounter (HOSPITAL_COMMUNITY): Payer: Medicare Other

## 2014-01-12 ENCOUNTER — Ambulatory Visit (HOSPITAL_COMMUNITY): Payer: Medicare Other | Attending: Cardiology | Admitting: Radiology

## 2014-01-12 VITALS — BP 141/87 | HR 74 | Ht 60.0 in | Wt 132.0 lb

## 2014-01-12 DIAGNOSIS — Z8249 Family history of ischemic heart disease and other diseases of the circulatory system: Secondary | ICD-10-CM | POA: Diagnosis not present

## 2014-01-12 DIAGNOSIS — I10 Essential (primary) hypertension: Secondary | ICD-10-CM | POA: Insufficient documentation

## 2014-01-12 DIAGNOSIS — R0789 Other chest pain: Secondary | ICD-10-CM | POA: Diagnosis not present

## 2014-01-12 DIAGNOSIS — R0989 Other specified symptoms and signs involving the circulatory and respiratory systems: Secondary | ICD-10-CM | POA: Insufficient documentation

## 2014-01-12 DIAGNOSIS — R079 Chest pain, unspecified: Secondary | ICD-10-CM

## 2014-01-12 DIAGNOSIS — R0602 Shortness of breath: Secondary | ICD-10-CM | POA: Diagnosis not present

## 2014-01-12 DIAGNOSIS — R0609 Other forms of dyspnea: Secondary | ICD-10-CM | POA: Diagnosis not present

## 2014-01-12 DIAGNOSIS — R9439 Abnormal result of other cardiovascular function study: Secondary | ICD-10-CM | POA: Insufficient documentation

## 2014-01-12 DIAGNOSIS — E785 Hyperlipidemia, unspecified: Secondary | ICD-10-CM

## 2014-01-12 MED ORDER — TECHNETIUM TC 99M SESTAMIBI GENERIC - CARDIOLITE
30.0000 | Freq: Once | INTRAVENOUS | Status: AC | PRN
  Administered 2014-01-12: 30 via INTRAVENOUS

## 2014-01-12 MED ORDER — TECHNETIUM TC 99M SESTAMIBI GENERIC - CARDIOLITE
10.0000 | Freq: Once | INTRAVENOUS | Status: AC | PRN
  Administered 2014-01-12: 10 via INTRAVENOUS

## 2014-01-12 NOTE — Progress Notes (Signed)
[address], [place] [phone]    Cardiology Nuclear Med Study  [name] is a 80 y.o. female     MRN : [id number]     DOB: 1934/01/10  Procedure Date: 01/12/2014  Nuclear Med Background Indication for Stress Test:  Evaluation for Ischemia History:  no prior hx CAD; 2010 MUGA-EF 60% Cardiac Risk Factors: Family History - CAD, Hypertension and Lipids  Symptoms:  Chest Pain with Exertion (last date of chest discomfort this am) and DOE   Nuclear Pre-Procedure Caffeine/Decaff Intake:  None NPO After: 8:00am   Lungs:  clear O2 Sat: 98% on room air. IV 0.9% NS with Angio Cath:  22g  IV Site: R Hand  IV Started by:  Annye Rusk, CNMT  Chest Size (in):  38 Cup Size: DD  Height: 5' (1.524 m)  Weight:  132 lb (59.875 kg)  BMI:  Body mass index is 25.78 kg/(m^2). Tech Comments:  n/a    Nuclear Med Study 1 or 2 day study: 1 day  Stress Test Type:  Stress  Reading MD: Kirk Ruths, MD  Order Authorizing Provider:  P. Martinique, MD  Resting Radionuclide: Technetium 81m Sestamibi  Resting Radionuclide Dose: 11.0 mCi   Stress Radionuclide:  Technetium 52m Sestamibi  Stress Radionuclide Dose: 33.0 mCi           Stress Protocol Rest HR: 74 Stress HR: 133  Rest BP: 141/87 Stress BP: 186/87  Exercise Time (min): 4:30 METS: 6.4           Dose of Adenosine (mg):  n/a Dose of Lexiscan: n/a mg  Dose of Atropine (mg): n/a Dose of Dobutamine: n/a mcg/kg/min (at max HR)  Stress Test Technologist: Glade Lloyd, BS-ES  Nuclear Technologist:  Charlton Amor, CNMT     Rest Procedure:  Myocardial perfusion imaging was performed at rest 45 minutes following the intravenous administration of Technetium 21m Sestamibi. Rest ECG: NSR, LAD  Stress Procedure:  The patient exercised on the treadmill utilizing the Bruce Protocol for 4:30 minutes. The patient stopped due to fatigue and denied any chest pain.  Technetium 84m Sestamibi  was injected at peak exercise and myocardial perfusion imaging was performed after a brief delay. Stress ECG: No significant ST segment change suggestive of ischemia.  QPS Raw Data Images:  Mild breast attenuation.  Normal left ventricular size. Stress Images:  There is decreased uptake in the apex. Rest Images:  There is decreased uptake in the apex. Subtraction (SDS):  No evidence of ischemia. Transient Ischemic Dilatation (Normal <1.22):  0.89 Lung/Heart Ratio (Normal <0.45):  0.45  Quantitative Gated Spect Images QGS EDV:  49 ml QGS ESV:  11 ml  Impression Exercise Capacity:  Poor exercise capacity. BP Response:  Normal blood pressure response. Clinical Symptoms:  There is dyspnea. ECG Impression:  No significant ST segment change suggestive of ischemia. Comparison with Prior Nuclear Study: No previous nuclear study performed  Overall Impression:  Low risk stress nuclear study with a small, severe intensity, fixed apical defect consistent with thinning/soft tissue attenuation; no ischemia.  LV Ejection Fraction: 77%.  LV Wall Motion:  NL LV Function; NL Wall Motion  Kirk Ruths

## 2014-01-16 ENCOUNTER — Other Ambulatory Visit (INDEPENDENT_AMBULATORY_CARE_PROVIDER_SITE_OTHER): Payer: Medicare Other

## 2014-01-16 ENCOUNTER — Other Ambulatory Visit: Payer: Self-pay

## 2014-01-16 DIAGNOSIS — R0989 Other specified symptoms and signs involving the circulatory and respiratory systems: Secondary | ICD-10-CM

## 2014-01-16 DIAGNOSIS — R079 Chest pain, unspecified: Secondary | ICD-10-CM

## 2014-01-16 DIAGNOSIS — R0609 Other forms of dyspnea: Secondary | ICD-10-CM | POA: Diagnosis not present

## 2014-01-16 LAB — BASIC METABOLIC PANEL
BUN: 18 mg/dL (ref 6–23)
CHLORIDE: 99 meq/L (ref 96–112)
CO2: 30 mEq/L (ref 19–32)
CREATININE: 0.9 mg/dL (ref 0.4–1.2)
Calcium: 9.6 mg/dL (ref 8.4–10.5)
GFR: 64.41 mL/min (ref >60.00)
Glucose, Bld: 117 mg/dL — ABNORMAL HIGH (ref 70–99)
POTASSIUM: 3.9 meq/L (ref 3.5–5.1)
Sodium: 135 mEq/L (ref 135–145)

## 2014-01-16 LAB — CBC WITH DIFFERENTIAL/PLATELET
Basophils Absolute: 0 10*3/uL (ref 0.0–0.1)
Basophils Relative: 0.3 % (ref 0.0–3.0)
Eosinophils Absolute: 0.2 10*3/uL (ref 0.0–0.7)
Eosinophils Relative: 4 % (ref 0.0–5.0)
HCT: 37 % (ref 36.0–46.0)
Hemoglobin: 12.4 g/dL (ref 12.0–15.0)
LYMPHS ABS: 0.6 10*3/uL — AB (ref 0.7–4.0)
Lymphocytes Relative: 10.9 % — ABNORMAL LOW (ref 12.0–46.0)
MCHC: 33.6 g/dL (ref 30.0–36.0)
MCV: 95.7 fl (ref 78.0–100.0)
MONO ABS: 0.5 10*3/uL (ref 0.1–1.0)
Monocytes Relative: 8.7 % (ref 3.0–12.0)
Neutro Abs: 4.5 10*3/uL (ref 1.4–7.7)
Neutrophils Relative %: 76.1 % (ref 43.0–77.0)
PLATELETS: 170 10*3/uL (ref 150.0–400.0)
RBC: 3.87 Mil/uL (ref 3.87–5.11)
RDW: 14 % (ref 11.5–14.6)
WBC: 5.9 10*3/uL (ref 4.5–10.5)

## 2014-01-16 LAB — PROTIME-INR
INR: 1.1 ratio — ABNORMAL HIGH (ref 0.8–1.0)
PROTHROMBIN TIME: 11.4 s (ref 10.2–12.4)

## 2014-01-17 ENCOUNTER — Encounter (HOSPITAL_COMMUNITY)
Admission: RE | Disposition: A | Discharge status: Home / Self Care | Payer: Self-pay | Source: Ambulatory Visit | Attending: Cardiology

## 2014-01-17 ENCOUNTER — Ambulatory Visit (HOSPITAL_COMMUNITY)
Admission: RE | Admit: 2014-01-17 | Discharge: 2014-01-17 | Disposition: A | Discharge status: Home / Self Care | Payer: Medicare Other | Source: Ambulatory Visit | Attending: Cardiology | Admitting: Cardiology

## 2014-01-17 DIAGNOSIS — I2510 Atherosclerotic heart disease of native coronary artery without angina pectoris: Secondary | ICD-10-CM | POA: Diagnosis not present

## 2014-01-17 DIAGNOSIS — E039 Hypothyroidism, unspecified: Secondary | ICD-10-CM | POA: Diagnosis not present

## 2014-01-17 DIAGNOSIS — M069 Rheumatoid arthritis, unspecified: Secondary | ICD-10-CM | POA: Diagnosis not present

## 2014-01-17 DIAGNOSIS — Z923 Personal history of irradiation: Secondary | ICD-10-CM | POA: Diagnosis not present

## 2014-01-17 DIAGNOSIS — Z853 Personal history of malignant neoplasm of breast: Secondary | ICD-10-CM | POA: Insufficient documentation

## 2014-01-17 DIAGNOSIS — R9430 Abnormal result of cardiovascular function study, unspecified: Secondary | ICD-10-CM | POA: Diagnosis not present

## 2014-01-17 DIAGNOSIS — I10 Essential (primary) hypertension: Secondary | ICD-10-CM | POA: Insufficient documentation

## 2014-01-17 DIAGNOSIS — Z86718 Personal history of other venous thrombosis and embolism: Secondary | ICD-10-CM | POA: Insufficient documentation

## 2014-01-17 DIAGNOSIS — R0609 Other forms of dyspnea: Secondary | ICD-10-CM | POA: Diagnosis not present

## 2014-01-17 DIAGNOSIS — E785 Hyperlipidemia, unspecified: Secondary | ICD-10-CM | POA: Diagnosis not present

## 2014-01-17 DIAGNOSIS — R7309 Other abnormal glucose: Secondary | ICD-10-CM | POA: Insufficient documentation

## 2014-01-17 DIAGNOSIS — Z9221 Personal history of antineoplastic chemotherapy: Secondary | ICD-10-CM | POA: Insufficient documentation

## 2014-01-17 DIAGNOSIS — R0989 Other specified symptoms and signs involving the circulatory and respiratory systems: Principal | ICD-10-CM | POA: Insufficient documentation

## 2014-01-17 HISTORY — PX: LEFT HEART CATHETERIZATION WITH CORONARY ANGIOGRAM: SHX5451

## 2014-01-17 SURGERY — LEFT HEART CATHETERIZATION WITH CORONARY ANGIOGRAM
Anesthesia: LOCAL

## 2014-01-17 MED ORDER — SODIUM CHLORIDE 0.9 % IJ SOLN: 3.0000 mL | Freq: Two times a day (BID) | INTRAMUSCULAR | Status: DC

## 2014-01-17 MED ORDER — LIDOCAINE HCL (PF) 1 % IJ SOLN
INTRAMUSCULAR | Status: AC
  Filled 2014-01-17: qty 30

## 2014-01-17 MED ORDER — VERAPAMIL HCL 2.5 MG/ML IV SOLN
INTRAVENOUS | Status: AC
  Filled 2014-01-17: qty 2

## 2014-01-17 MED ORDER — HEPARIN SODIUM (PORCINE) 1000 UNIT/ML IJ SOLN
INTRAMUSCULAR | Status: AC
  Filled 2014-01-17: qty 1

## 2014-01-17 MED ORDER — MIDAZOLAM HCL 2 MG/2ML IJ SOLN
INTRAMUSCULAR | Status: AC
  Filled 2014-01-17: qty 2

## 2014-01-17 MED ORDER — SODIUM CHLORIDE 0.9 % IV SOLN: 250.0000 mL | INTRAVENOUS | Status: DC | PRN

## 2014-01-17 MED ORDER — HEPARIN (PORCINE) IN NACL 2-0.9 UNIT/ML-% IJ SOLN
INTRAMUSCULAR | Status: AC
  Filled 2014-01-17: qty 1000

## 2014-01-17 MED ORDER — SODIUM CHLORIDE 0.9 % IV SOLN
INTRAVENOUS | Status: DC
  Administered 2014-01-17: 08:00:00 via INTRAVENOUS

## 2014-01-17 MED ORDER — SODIUM CHLORIDE 0.9 % IV SOLN: 1.0000 mL/kg/h | INTRAVENOUS | Status: DC

## 2014-01-17 MED ORDER — NITROGLYCERIN 0.2 MG/ML ON CALL CATH LAB
INTRAVENOUS | Status: AC
  Filled 2014-01-17: qty 1

## 2014-01-17 MED ORDER — SODIUM CHLORIDE 0.9 % IJ SOLN: 3.0000 mL | INTRAMUSCULAR | Status: DC | PRN

## 2014-01-17 MED ORDER — FENTANYL CITRATE 0.05 MG/ML IJ SOLN
INTRAMUSCULAR | Status: AC
  Filled 2014-01-17: qty 2

## 2014-01-17 MED ORDER — ASPIRIN 81 MG PO CHEW
81.0000 mg | CHEWABLE_TABLET | ORAL | Status: AC
  Administered 2014-01-17: 81 mg via ORAL
  Filled 2014-01-17: qty 1

## 2014-01-17 NOTE — Interval H&P Note (Signed)
History and Physical Interval Note:  01/17/2014 9:03 AM  [name]  has presented today for surgery, with the diagnosis of SOB and abnormal myoview  The various methods of treatment have been discussed with the patient and family. After consideration of risks, benefits and other options for treatment, the patient has consented to  Procedure(s): LEFT HEART CATHETERIZATION WITH CORONARY ANGIOGRAM (N/A) as a surgical intervention .  The patient's history has been reviewed, patient examined, no change in status, stable for surgery.  I have reviewed the patient's chart and labs.  Questions were answered to the patient's satisfaction.   Cath Lab Visit (complete for each Cath Lab visit)  Clinical Evaluation Leading to the Procedure:   ACS: no  Non-ACS:    Anginal Classification: CCS III  Anti-ischemic medical therapy: No Therapy  Non-Invasive Test Results: Intermediate-risk stress test findings: cardiac mortality 1-3%/year  Prior CABG: No previous CABG        [name] 01/17/2014 9:03 AM

## 2014-01-17 NOTE — Discharge Instructions (Signed)

## 2014-01-17 NOTE — CV Procedure (Signed)
    Cardiac Catheterization Procedure Note  Name: [name] MRN: [id number] DOB: 1934/04/22  Procedure: Left Heart Cath, Selective Coronary Angiography, LV angiography  Indication: 80 yo WF with history of RA, multiple cardiac risk factors presents with symptoms of progressive dyspnea on exertion. Myoview shows poor exercise tolerance with a fixed apical defect.    Procedural Details: The right wrist was prepped, draped, and anesthetized with 1% lidocaine. Using the modified Seldinger technique, a 5 French sheath was introduced into the right radial artery. 3 mg of verapamil was administered through the sheath, weight-based unfractionated heparin was administered intravenously. Standard Judkins catheters were used for selective coronary angiography and left ventriculography. Catheter exchanges were performed over an exchange length guidewire. There were no immediate procedural complications. A TR band was used for radial hemostasis at the completion of the procedure.  The patient was transferred to the post catheterization recovery area for further monitoring.  Procedural Findings: Hemodynamics: AO 140/69 mean 101 mm Hg  LV 140/12 mm Hg  Coronary angiography: Coronary dominance: left  Left mainstem: Normal  Left anterior descending (LAD): mild irregularities in the proximal vessel less than 20%.  Left circumflex (LCx): Mild irregularities in the proximal to mid vessel less than 20%  Right coronary artery (RCA): Normal, nondominant.  Left ventriculography: Left ventricular systolic function is normal, LVEF is estimated at 55-65%, there is no significant mitral regurgitation   Final Conclusions:   1. Mild nonobstructive CAD 2. Normal LV function  Recommendations: Consider pulmonary evaluation for her dyspnea.  Collier Salina [facility] 01/17/2014, 9:38 AM

## 2014-01-17 NOTE — H&P (View-Only) (Signed)
[name] Date of Birth: [birth year] Medical Record #[id number]  History of Present Illness: [name] is seen at the request of Dr. Inda Merlin for evaluation of dyspnea. I apparently saw [name] several years ago but those records are not available at this time. She presents with symptoms of worsening dyspnea on exertion. She states she has had SOB for 78 years off and on. More recently her symptoms have progressed where now she has to stop and catch her breath after walking one quarter mile. She also gets SOB climbing stairs. Her symptoms are accompanied by a burning sensation in her chest that is relieved with rest. She does have a history of RA and is on methotrexate. She also has a history of HTN and hyperlipidemia. Her father died at age 53 with an MI. Recent CXR was apparently unremarkable.   Current Outpatient Prescriptions on File Prior to Visit  Medication Sig Dispense Refill  . acetaminophen-codeine (TYLENOL #3) 300-30 MG per tablet Take 1 tablet by mouth every 4 (four) hours as needed.      . ALPRAZolam (XANAX) 0.25 MG tablet Take 0.25 mg by mouth daily as needed.      . calcium carbonate 1250 MG capsule Take 1,250 mg by mouth daily.      . cholecalciferol (VITAMIN D) 1000 UNITS tablet Take 1,000 Units by mouth daily.      Marland Kitchen dicyclomine (BENTYL) 10 MG capsule       . fish oil-omega-3 fatty acids 1000 MG capsule Take 1 g by mouth daily.      . folic acid (FOLVITE) 1 MG tablet Take 2 mg by mouth daily.       Marland Kitchen levothyroxine (SYNTHROID, LEVOTHROID) 175 MCG tablet Take 150 mcg by mouth daily.       . Melatonin 2.5 MG CAPS Take by mouth.      . methotrexate (RHEUMATREX) 15 MG tablet Inject 0.8 mg as directed once a week. Caution: Chemotherapy. Protect from light.      . Multiple Vitamin (MULTIVITAMIN) capsule Take 1 capsule by mouth daily.      . potassium chloride SA (K-DUR,KLOR-CON) 20 MEQ tablet TAKE 1 TABLET DAILY  90 tablet  6  . simvastatin (ZOCOR) 20 MG tablet Take 20 mg by  mouth every evening.       No current facility-administered medications on file prior to visit.    Allergies  Allergen Reactions  . Dilaudid [Hydromorphone Hcl] Nausea And Vomiting  . Morphine And Related   . Phenergan [Promethazine Hcl]   . Tetanus Toxoids     Past Medical History  Diagnosis Date  . Rheumatoid arthritis(714.0)   . Hypothyroidism   . Breast cancer 2008    RT, chemo, lumpectomy  . UTI (lower urinary tract infection)   . Diverticulitis   . Kidney tumor 2010    RFA  . HTN (hypertension)   . Hyperlipidemia   . DVT (deep vein thrombosis) in pregnancy     site of PICC catheter  . Prediabetes     Past Surgical History  Procedure Laterality Date  . Tonsillectomy    . Ovarian cyst removal    . Breast lumpectomy    . Abdominal surgery      for ruptured diverticuli  . Cervical spine surgery    . Bilateral foot surgery    . Renal rfa    . Perforation of colon    . Take down of colonostomy  2011    History  Smoking  status  . Never Smoker   Smokeless tobacco  . Not on file    History  Alcohol Use No    Family History  Problem Relation Age of Onset  . Heart disease Mother   . Heart disease Father     Review of Systems: The review of systems is positive for arthralgias. She does try and walk one mile per day.  All other systems were reviewed and are negative.  Physical Exam: BP 136/82  Pulse 60  Ht 5' (1.524 m)  Wt 132 lb (59.875 kg)  BMI 25.78 kg/m2 She is a pleasant WF in NAD. HEENT: [place]/AT, PERRLA, EOMI, sclera are clear. Oropharynx is clear.  Neck: no JVD, adenopathy, thyromegaly, or bruits. Lungs: clear CV: RRR, normal S1-2, normal PMI. No gallop or murmur. Abd: soft and NT, no masses or HSM. BS + Ext: no edema or cyanosis. Pulses 2+ Skin: warm and dry Neuro: alert and oriented x 3. Nonfocal.   LABORATORY DATA: Cholesterol- 139, trig-58, LDL-67, HDL-60. TSH .63, A1c 6%. Other chemistries and CBC are normal.  Ecg: NSR rate 113.  Nonspecific TWA.  Assessment / Plan: 1. Dyspnea and chest pain on exertion. Concerning for angina pectoris. Multiple risk factors including HTN, hyperlipidemia, RA, and family history of premature CAD. Will arrange stress Myoview to further stratify her risk. If abnormal may need cardiac cath. Other possibility includes rheumatoid lung disease but less likely with normal CXR.  2. RA  3. HTN  4. Hyperlipidemia- well controlled

## 2014-02-01 ENCOUNTER — Encounter: Payer: Self-pay | Admitting: Nurse Practitioner

## 2014-02-01 ENCOUNTER — Ambulatory Visit (INDEPENDENT_AMBULATORY_CARE_PROVIDER_SITE_OTHER): Payer: Medicare Other | Admitting: Nurse Practitioner

## 2014-02-01 VITALS — BP 150/80 | HR 90 | Ht 60.5 in | Wt 134.4 lb

## 2014-02-01 DIAGNOSIS — R0989 Other specified symptoms and signs involving the circulatory and respiratory systems: Secondary | ICD-10-CM | POA: Diagnosis not present

## 2014-02-01 DIAGNOSIS — R0609 Other forms of dyspnea: Secondary | ICD-10-CM | POA: Diagnosis not present

## 2014-02-01 DIAGNOSIS — Z9889 Other specified postprocedural states: Secondary | ICD-10-CM

## 2014-02-01 DIAGNOSIS — R0600 Dyspnea, unspecified: Secondary | ICD-10-CM

## 2014-02-01 NOTE — Patient Instructions (Addendum)
Continue with your current medicines  We will refer you to the lung doctor - Dr. Alfonso Ellis, [place] only  Monitor your blood pressure at home - keep a diary - call your PCP if consistently above 140/90  See Dr. Martinique back as needed.  Call the [place] office at [phone] if you have any questions, problems or concerns.

## 2014-02-01 NOTE — Progress Notes (Signed)
[name] Date of Birth: April 16, 1934 Medical Record #[id number]  History of Present Illness: [name] is seen back today for a post cath visit. Seen for Dr. Martinique. She was recently referred back to him for dyspnea. She does have a history of RA and is on methotrexate. She also has a history of HTN and hyperlipidemia. Stress testing was abnormal and she was referred on for cardiac cath - results noted below. EF is normal. Mild nonobstructive disease - manage medically.  Recommended that she be referred to pulmonary.   Comes back today. Here alone. Doing well. Still short of breath - mainly with exertion. She is an avid walker and wants to be able to continue. No problems with her cath site. Does not check BP at home.   Current Outpatient Prescriptions  Medication Sig Dispense Refill  . acetaminophen-codeine (TYLENOL #3) 300-30 MG per tablet Take 1 tablet by mouth every 4 (four) hours as needed for moderate pain.       Marland Kitchen ALPRAZolam (XANAX) 0.25 MG tablet Take 0.125 mg by mouth daily as needed for anxiety.       . Calcium Citrate-Vitamin D (CITRACAL + D PO) Take 1 tablet by mouth daily.      . cholecalciferol (VITAMIN D) 1000 UNITS tablet Take 1,000 Units by mouth daily.      Marland Kitchen dicyclomine (BENTYL) 10 MG capsule Take 10 mg by mouth daily as needed for spasms.       . diphenhydrAMINE (SOMINEX) 25 MG tablet Take 25 mg by mouth at bedtime as needed for sleep.      . fish oil-omega-3 fatty acids 1000 MG capsule Take 1 g by mouth daily.      . folic acid (FOLVITE) 1 MG tablet Take 2 mg by mouth daily.       Marland Kitchen levothyroxine (SYNTHROID, LEVOTHROID) 150 MCG tablet Take 150 mcg by mouth daily before breakfast.      . Melatonin 2.5 MG CAPS Take 2.5 mg by mouth at bedtime as needed.       . methotrexate (50 MG/ML) 1 G injection Inject 0.8 mg into the vein once a week. Sunday      . Multiple Vitamin (MULTIVITAMIN) capsule Take 1 capsule by mouth daily.      . naproxen (NAPRELAN) 500 MG 24 hr tablet  Take 500 mg by mouth daily with breakfast.       . Polyethylene Glycol 3350 (MIRALAX PO) Take by mouth See admin instructions. 1/2 a capful once daily      . potassium chloride SA (K-DUR,KLOR-CON) 20 MEQ tablet Take 20 mEq by mouth daily.      . ranitidine (ZANTAC) 75 MG tablet Take 75 mg by mouth as needed for heartburn.      . simvastatin (ZOCOR) 20 MG tablet Take 20 mg by mouth every evening.      . triamterene-hydrochlorothiazide (MAXZIDE-25) 37.5-25 MG per tablet Take 1 tablet by mouth daily.       No current facility-administered medications for this visit.    Allergies  Allergen Reactions  . Dilaudid [Hydromorphone Hcl] Nausea And Vomiting  . Morphine And Related   . Phenergan [Promethazine Hcl]   . Tetanus Toxoids     Past Medical History  Diagnosis Date  . Rheumatoid arthritis(714.0)   . Hypothyroidism   . Breast cancer 2008    RT, chemo, lumpectomy  . UTI (lower urinary tract infection)   . Diverticulitis   . Kidney tumor 2010  RFA  . HTN (hypertension)   . Hyperlipidemia   . DVT (deep vein thrombosis) in pregnancy     site of PICC catheter  . Prediabetes   . Abnormal cardiovascular stress test 12/2013    s/p cath with mild nonobstructive CAD normal EF - managed medically    Past Surgical History  Procedure Laterality Date  . Tonsillectomy    . Ovarian cyst removal    . Breast lumpectomy    . Abdominal surgery      for ruptured diverticuli  . Cervical spine surgery    . Bilateral foot surgery    . Renal rfa    . Perforation of colon    . Take down of colonostomy  2011  . Cardiac catheterization      History  Smoking status  . Never Smoker   Smokeless tobacco  . Not on file    History  Alcohol Use No    Family History  Problem Relation Age of Onset  . Heart disease Mother   . Heart disease Father     Review of Systems: The review of systems is per the HPI.  All other systems were reviewed and are negative.  Physical Exam: BP 150/80   Pulse 90  Ht 5' 0.5" (1.537 m)  Wt 134 lb 6.4 oz (60.963 kg)  BMI 25.81 kg/m2  SpO2 96% BP is 160/80 by me.  Patient is very pleasant and in no acute distress. Skin is warm and dry. Color is normal.  HEENT is unremarkable. Normocephalic/atraumatic. PERRL. Sclera are nonicteric. Neck is supple. No masses. No JVD. Lungs are clear. Cardiac exam shows a regular rate and rhythm. Abdomen is soft. Extremities are without edema. Gait and ROM are intact. No gross neurologic deficits noted. Right wrist looks fine.   LABORATORY DATA:  Lab Results  Component Value Date   WBC 5.9 01/16/2014   HGB 12.4 01/16/2014   HCT 37.0 01/16/2014   PLT 170.0 01/16/2014   GLUCOSE 117* 01/16/2014   CHOL  Value: 116        ATP III CLASSIFICATION:  <200     mg/dL   Desirable  200-239  mg/dL   Borderline High  >=240    mg/dL   High        07/09/2009   TRIG 109 07/09/2009   ALT 20 08/06/2012   AST 28 08/06/2012   NA 135 01/16/2014   K 3.9 01/16/2014   CL 99 01/16/2014   CREATININE 0.9 01/16/2014   BUN 18 01/16/2014   CO2 30 01/16/2014   TSH 1.366 Test methodology is 3rd generation TSH 07/01/2009   INR 1.1* 01/16/2014    Procedure: Left Heart Cath, Selective Coronary Angiography, LV angiography  Indication: 80 yo WF with history of RA, multiple cardiac risk factors presents with symptoms of progressive dyspnea on exertion. Myoview shows poor exercise tolerance with a fixed apical defect.  Procedural Details: The right wrist was prepped, draped, and anesthetized with 1% lidocaine. Using the modified Seldinger technique, a 5 French sheath was introduced into the right radial artery. 3 mg of verapamil was administered through the sheath, weight-based unfractionated heparin was administered intravenously. Standard Judkins catheters were used for selective coronary angiography and left ventriculography. Catheter exchanges were performed over an exchange length guidewire. There were no immediate procedural complications. A TR band was used for  radial hemostasis at the completion of the procedure. The patient was transferred to the post catheterization recovery area for further monitoring.  Procedural  Findings:  Hemodynamics:  AO 140/69 mean 101 mm Hg  LV 140/12 mm Hg  Coronary angiography:  Coronary dominance: left  Left mainstem: Normal  Left anterior descending (LAD): mild irregularities in the proximal vessel less than 20%.  Left circumflex (LCx): Mild irregularities in the proximal to mid vessel less than 20%  Right coronary artery (RCA): Normal, nondominant.  Left ventriculography: Left ventricular systolic function is normal, LVEF is estimated at 55-65%, there is no significant mitral regurgitation  Final Conclusions:  1. Mild nonobstructive CAD  2. Normal LV function  Recommendations: Consider pulmonary evaluation for her dyspnea.  Collier Salina [facility]  01/17/2014, 9:38 AM  Myoview Impression  Exercise Capacity: Poor exercise capacity.  BP Response: Normal blood pressure response.  Clinical Symptoms: There is dyspnea.  ECG Impression: No significant ST segment change suggestive of ischemia.  Comparison with Prior Nuclear Study: No previous nuclear study performed  Overall Impression: Low risk stress nuclear study with a small, severe intensity, fixed apical defect consistent with thinning/soft tissue attenuation; no ischemia.  LV Ejection Fraction: 77%. LV Wall Motion: NL LV Function; NL Wall Motion  Kirk Ruths     Assessment / Plan: 1. S/P cardiac cath - has mild nonobstructive CAD - to manage medically.  2. Dyspnea - will refer on to Pulmonary for evaluation/treatment  3. HTN - Bp up here today - I have asked her to monitor at home - if consistently above 161 systolic - call PCP or here.   4. HLD - on statin  See back prn. Refer to pulmonary. No change in current medicines for now.   Patient is agreeable to this plan and will call if any problems develop in the interim.   Burtis Junes, RN,  [facility] [address] [place], [place]  [phone]

## 2014-02-02 ENCOUNTER — Ambulatory Visit (INDEPENDENT_AMBULATORY_CARE_PROVIDER_SITE_OTHER): Payer: Medicare Other | Admitting: Podiatry

## 2014-02-02 ENCOUNTER — Encounter: Payer: Self-pay | Admitting: Podiatry

## 2014-02-02 VITALS — BP 155/77 | HR 74 | Resp 12

## 2014-02-02 DIAGNOSIS — L84 Corns and callosities: Secondary | ICD-10-CM | POA: Diagnosis not present

## 2014-02-02 NOTE — Progress Notes (Signed)
Subjective:     Patient ID: [name], female   DOB: January 13, 1934, 80 y.o.   MRN: [id number]  HPI patient presents with calluses on the plantar aspect of both feet that she can not take care of   Review of Systems     Objective:   Physical Exam Neurovascular status unchanged with no change in health is and lesions plantar aspect second metatarsal both feet    Assessment:     Keratotic lesion second metatarsal both feet    Plan:     Debridement of lesions with no iatrogenic bleeding noted lesions both feet

## 2014-02-07 ENCOUNTER — Other Ambulatory Visit: Payer: Self-pay | Admitting: Urology

## 2014-02-07 DIAGNOSIS — C649 Malignant neoplasm of unspecified kidney, except renal pelvis: Secondary | ICD-10-CM

## 2014-02-13 DIAGNOSIS — E039 Hypothyroidism, unspecified: Secondary | ICD-10-CM | POA: Diagnosis not present

## 2014-02-15 ENCOUNTER — Telehealth: Payer: Self-pay | Admitting: Internal Medicine

## 2014-02-15 NOTE — Telephone Encounter (Signed)
Rec'd from Ava @ [facility] forward 3 pages to Dr. Nathaneil Canary

## 2014-02-17 ENCOUNTER — Encounter: Payer: Self-pay | Admitting: Pulmonary Disease

## 2014-02-17 ENCOUNTER — Ambulatory Visit (INDEPENDENT_AMBULATORY_CARE_PROVIDER_SITE_OTHER): Payer: Medicare Other | Admitting: Pulmonary Disease

## 2014-02-17 VITALS — BP 144/84 | HR 86 | Temp 97.9°F | Ht 61.0 in | Wt 133.0 lb

## 2014-02-17 DIAGNOSIS — R0989 Other specified symptoms and signs involving the circulatory and respiratory systems: Secondary | ICD-10-CM | POA: Diagnosis not present

## 2014-02-17 DIAGNOSIS — R0609 Other forms of dyspnea: Secondary | ICD-10-CM

## 2014-02-17 NOTE — Progress Notes (Signed)
Subjective:    Patient ID: [name], female    DOB: Jun 07, 1934, 80 y.o.   MRN: [id number]  HPI  This is a very pleasant 80 year old female who has a past medical history significant for rheumatoid arthritis and has never smoked cigarettes he comes to our clinic today for evaluation of shortness of breath. She says that she's noticed the symptom for the past year and it has been slightly progressive. She states that when she exerts her self with heavy activity she starts to feel shortness of breath and a burning in her chest anteriorly. She has noticed this when going on walks with her husband and climbing hills. She's also noticed it while climbing stairs and scrubbing the floor in her kitchen. Other mild activity such as walking on level ground has not caused the symptom. She has never has this with rest. Certain environmental triggers such as [place] or fumes and animals have not caused flares in the symptom.  This is not associated with leg swelling or cough. She says that she has 2 cats that she has often wondered if she is allergic to. However, she never has an increase in shortness of breath when she is around them.  She does have allergic rhinitis with congestion and postnasal drip. This has not been particularly worse lately.  As far as her rheumatoid arthritis goes she has symptoms in her hands and feet. She has been treating this for many years with subcutaneous methotrexate. This has not changed lately. She has not had flares or changes in her rheumatoid arthritis. Her symptoms have been well-controlled. She's not had fevers chills or weight loss.  Though she never smoked she says that both of her parents smoked heavily when she was a child.  Past Medical History  Diagnosis Date  . Rheumatoid arthritis(714.0)   . Hypothyroidism   . Breast cancer 2008    RT, chemo, lumpectomy  . UTI (lower urinary tract infection)   . Diverticulitis   . Kidney tumor 2010    RFA  . HTN  (hypertension)   . Hyperlipidemia   . DVT (deep vein thrombosis) in pregnancy     site of PICC catheter  . Prediabetes   . Abnormal cardiovascular stress test 12/2013    s/p cath with mild nonobstructive CAD normal EF - managed medically     Family History  Problem Relation Age of Onset  . Heart disease Mother   . Heart disease Father   . Cancer Mother     stomach     History   Social History  . Marital Status: Married    Spouse Name: N/A    Number of Children: 2  . Years of Education: N/A   Occupational History  . Not on file.   Social History Main Topics  . Smoking status: Never Smoker   . Smokeless tobacco: Never Used  . Alcohol Use: No  . Drug Use: Not on file  . Sexual Activity: Not on file   Other Topics Concern  . Not on file   Social History Narrative  . No narrative on file     Allergies  Allergen Reactions  . Dilaudid [Hydromorphone Hcl] Nausea And Vomiting  . Morphine And Related   . Phenergan [Promethazine Hcl]   . Tamiflu [Oseltamivir Phosphate]     rash  . Taxol [Paclitaxel]     rash  . Tetanus Toxoids      Outpatient Prescriptions Prior to Visit  Medication Sig Dispense  Refill  . acetaminophen-codeine (TYLENOL #3) 300-30 MG per tablet Take 1 tablet by mouth every 4 (four) hours as needed for moderate pain.       Marland Kitchen ALPRAZolam (XANAX) 0.25 MG tablet Take 0.125 mg by mouth daily as needed for anxiety.       . Calcium Citrate-Vitamin D (CITRACAL + D PO) Take 1 tablet by mouth daily.      . cholecalciferol (VITAMIN D) 1000 UNITS tablet Take 1,000 Units by mouth daily.      Marland Kitchen dicyclomine (BENTYL) 10 MG capsule Take 10 mg by mouth daily as needed for spasms.       . fish oil-omega-3 fatty acids 1000 MG capsule Take 1,200 mg by mouth daily.       . folic acid (FOLVITE) 1 MG tablet Take 2 mg by mouth daily.       Marland Kitchen levothyroxine (SYNTHROID, LEVOTHROID) 150 MCG tablet Take 150 mcg by mouth daily before breakfast.      . Melatonin 2.5 MG CAPS Take  2.5 mg by mouth at bedtime as needed.       . methotrexate (50 MG/ML) 1 G injection Inject 0.8 mg into the vein once a week. Sunday      . Multiple Vitamin (MULTIVITAMIN) capsule Take 1 capsule by mouth daily.      . naproxen (NAPRELAN) 500 MG 24 hr tablet Take 500 mg by mouth daily with breakfast.       . Polyethylene Glycol 3350 (MIRALAX PO) Take by mouth See admin instructions. 1/2 a capful once daily      . potassium chloride SA (K-DUR,KLOR-CON) 20 MEQ tablet Take 20 mEq by mouth daily.      . ranitidine (ZANTAC) 75 MG tablet Take 75 mg by mouth as needed for heartburn.      . simvastatin (ZOCOR) 20 MG tablet Take 20 mg by mouth every evening.      . triamterene-hydrochlorothiazide (MAXZIDE-25) 37.5-25 MG per tablet Take 1 tablet by mouth daily.      . diphenhydrAMINE (SOMINEX) 25 MG tablet Take 25 mg by mouth at bedtime as needed for sleep.       No facility-administered medications prior to visit.      Review of Systems  Constitutional: Negative for fever and unexpected weight change.  HENT: Positive for postnasal drip and sinus pressure. Negative for congestion, dental problem, ear pain, nosebleeds, rhinorrhea, sneezing, sore throat and trouble swallowing.   Eyes: Negative for redness and itching.  Respiratory: Positive for chest tightness and shortness of breath. Negative for cough and wheezing.   Cardiovascular: Negative for palpitations and leg swelling.  Gastrointestinal: Negative for nausea and vomiting.  Genitourinary: Negative for dysuria.  Musculoskeletal: Negative for joint swelling.  Skin: Negative for rash.  Neurological: Negative for headaches.  Hematological: Does not bruise/bleed easily.  Psychiatric/Behavioral: Negative for dysphoric mood. The patient is not nervous/anxious.        Objective:   Physical Exam Filed Vitals:   02/17/14 1357  BP: 144/84  Pulse: 86  Temp: 97.9 F (36.6 C)  TempSrc: Oral  Height: 5\' 1"  (1.549 m)  Weight: 133 lb (60.328 kg)   SpO2: 97%  RA  Gen: well appearing, no acute distress HEENT: NCAT, PERRL, EOMi, OP clear, neck supple without masses PULM: CTA B CV: RRR, slight systolic murmur, no JVD AB: BS+, soft, nontender, no hsm Ext: warm, no edema, no clubbing, no cyanosis Derm: no rash or skin breakdown Neuro: A&Ox4, CN II-XII intact, strength 5/5  in all 4 extremities  February 2015 chest x-ray normal April 2015 left heart catheterization> 20% stenosis of the left anterior descending and left circumflex but otherwise no coronary artery disease      Assessment & Plan:   Dyspnea on exertion I explained to Ms. Tiner that the differential diagnosis of dyspnea is broad and includes heart diease, lung disease, anemia, and neurologic conditions (among others).  At this point she has had a very thorough work up with a left heart catheterization, chest imaging and blood work.  I am encouraged that her lung exam, CXR and ambulatory oximetry is completely normal today in clinic.  We will check PFTs to look for evidence of ILD or bronchiolitis that may be associated with her rheumatoid arthritis that has not shown up so far on her testing.  A less likely consideration is deconditioning, but she seems quite active so I doubt this is the case.  Plan: -Full PFT -if abnormal > CT chest to look for ILD -if normal > echocardiogram to look for valvular disease and/or pulmonary hypertension -continue regular exercise -if all testing is normal, could consider CPET   Updated Medication List Outpatient Encounter Prescriptions as of 02/17/2014  Medication Sig  . acetaminophen-codeine (TYLENOL #3) 300-30 MG per tablet Take 1 tablet by mouth every 4 (four) hours as needed for moderate pain.   Marland Kitchen ALPRAZolam (XANAX) 0.25 MG tablet Take 0.125 mg by mouth daily as needed for anxiety.   . Calcium Citrate-Vitamin D (CITRACAL + D PO) Take 1 tablet by mouth daily.  . cholecalciferol (VITAMIN D) 1000 UNITS tablet Take 1,000 Units by  mouth daily.  Marland Kitchen dicyclomine (BENTYL) 10 MG capsule Take 10 mg by mouth daily as needed for spasms.   . fish oil-omega-3 fatty acids 1000 MG capsule Take 1,200 mg by mouth daily.   . folic acid (FOLVITE) 1 MG tablet Take 2 mg by mouth daily.   Marland Kitchen levothyroxine (SYNTHROID, LEVOTHROID) 150 MCG tablet Take 150 mcg by mouth daily before breakfast.  . Melatonin 2.5 MG CAPS Take 2.5 mg by mouth at bedtime as needed.   . methotrexate (50 MG/ML) 1 G injection Inject 0.8 mg into the vein once a week. Sunday  . Multiple Vitamin (MULTIVITAMIN) capsule Take 1 capsule by mouth daily.  . naproxen (NAPRELAN) 500 MG 24 hr tablet Take 500 mg by mouth daily with breakfast.   . Polyethylene Glycol 3350 (MIRALAX PO) Take by mouth See admin instructions. 1/2 a capful once daily  . potassium chloride SA (K-DUR,KLOR-CON) 20 MEQ tablet Take 20 mEq by mouth daily.  . ranitidine (ZANTAC) 75 MG tablet Take 75 mg by mouth as needed for heartburn.  . simvastatin (ZOCOR) 20 MG tablet Take 20 mg by mouth every evening.  . triamterene-hydrochlorothiazide (MAXZIDE-25) 37.5-25 MG per tablet Take 1 tablet by mouth daily.  . [DISCONTINUED] diphenhydrAMINE (SOMINEX) 25 MG tablet Take 25 mg by mouth at bedtime as needed for sleep.

## 2014-02-17 NOTE — Assessment & Plan Note (Signed)
I explained to [name] that the differential diagnosis of dyspnea is broad and includes heart diease, lung disease, anemia, and neurologic conditions (among others).  At this point she has had a very thorough work up with a left heart catheterization, chest imaging and blood work.  I am encouraged that her lung exam, CXR and ambulatory oximetry is completely normal today in clinic.  We will check PFTs to look for evidence of ILD or bronchiolitis that may be associated with her rheumatoid arthritis that has not shown up so far on her testing.  A less likely consideration is deconditioning, but she seems quite active so I doubt this is the case.  Plan: -Full PFT -if abnormal > CT chest to look for ILD -if normal > echocardiogram to look for valvular disease and/or pulmonary hypertension -continue regular exercise -if all testing is normal, could consider CPET

## 2014-02-17 NOTE — Patient Instructions (Signed)
We will arrange a lung function test for you and will call you with the results Based on the results of the test we may order more tests.  Otherwise, plan to stay active and increase your activity We will see you back in 3 months or sooner if needed

## 2014-02-20 ENCOUNTER — Ambulatory Visit (HOSPITAL_COMMUNITY)
Admission: RE | Admit: 2014-02-20 | Discharge: 2014-02-20 | Disposition: A | Discharge status: Home / Self Care | Payer: Medicare Other | Source: Ambulatory Visit | Attending: Pulmonary Disease | Admitting: Pulmonary Disease

## 2014-02-20 DIAGNOSIS — R0609 Other forms of dyspnea: Secondary | ICD-10-CM | POA: Insufficient documentation

## 2014-02-20 DIAGNOSIS — R0989 Other specified symptoms and signs involving the circulatory and respiratory systems: Secondary | ICD-10-CM | POA: Insufficient documentation

## 2014-02-20 DIAGNOSIS — R0602 Shortness of breath: Secondary | ICD-10-CM | POA: Insufficient documentation

## 2014-02-20 LAB — PULMONARY FUNCTION TEST
DL/VA % pred: 97 %
DL/VA: 4.14 ml/min/mmHg/L
DLCO UNC: 15.59 ml/min/mmHg
DLCO unc % pred: 82 %
FEF 25-75 POST: 1.23 L/s
FEF 25-75 Pre: 0.93 L/sec
FEF2575-%Change-Post: 31 %
FEF2575-%Pred-Post: 94 %
FEF2575-%Pred-Pre: 71 %
FEV1-%Change-Post: 4 %
FEV1-%PRED-POST: 88 %
FEV1-%Pred-Pre: 83 %
FEV1-Post: 1.45 L
FEV1-Pre: 1.39 L
FEV1FVC-%Change-Post: 4 %
FEV1FVC-%Pred-Pre: 97 %
FEV6-%CHANGE-POST: 0 %
FEV6-%Pred-Post: 91 %
FEV6-%Pred-Pre: 91 %
FEV6-POST: 1.92 L
FEV6-Pre: 1.92 L
FEV6FVC-%Change-Post: 0 %
FEV6FVC-%Pred-Post: 105 %
FEV6FVC-%Pred-Pre: 106 %
FVC-%Change-Post: 0 %
FVC-%PRED-POST: 86 %
FVC-%Pred-Pre: 86 %
FVC-Post: 1.93 L
FVC-Pre: 1.92 L
PRE FEV1/FVC RATIO: 72 %
PRE FEV6/FVC RATIO: 100 %
Post FEV1/FVC ratio: 75 %
Post FEV6/FVC ratio: 99 %
RV % pred: 144 %
RV: 3.1 L
TLC % pred: 113 %
TLC: 5.06 L

## 2014-02-20 MED ORDER — ALBUTEROL SULFATE (2.5 MG/3ML) 0.083% IN NEBU
2.5000 mg | INHALATION_SOLUTION | Freq: Once | RESPIRATORY_TRACT | Status: AC
  Administered 2014-02-20: 2.5 mg via RESPIRATORY_TRACT

## 2014-02-21 ENCOUNTER — Encounter: Payer: Self-pay | Admitting: Pulmonary Disease

## 2014-02-22 ENCOUNTER — Telehealth: Payer: Self-pay | Admitting: Pulmonary Disease

## 2014-02-22 NOTE — Telephone Encounter (Signed)
Dr Lake Bells, just fyi.    Should I request these results?  Thanks, A

## 2014-02-23 NOTE — Telephone Encounter (Signed)
Noted. Nothing further needed at this time.  

## 2014-02-23 NOTE — Telephone Encounter (Signed)
No need to request I think she was confusing EKG with echo

## 2014-03-03 ENCOUNTER — Telehealth: Payer: Self-pay | Admitting: Pulmonary Disease

## 2014-03-03 ENCOUNTER — Ambulatory Visit (HOSPITAL_COMMUNITY)
Admission: RE | Admit: 2014-03-03 | Discharge: 2014-03-03 | Disposition: A | Discharge status: Home / Self Care | Payer: Medicare Other | Source: Ambulatory Visit | Attending: Urology | Admitting: Urology

## 2014-03-03 DIAGNOSIS — C50919 Malignant neoplasm of unspecified site of unspecified female breast: Secondary | ICD-10-CM | POA: Diagnosis not present

## 2014-03-03 DIAGNOSIS — Z85528 Personal history of other malignant neoplasm of kidney: Secondary | ICD-10-CM | POA: Insufficient documentation

## 2014-03-03 DIAGNOSIS — C649 Malignant neoplasm of unspecified kidney, except renal pelvis: Secondary | ICD-10-CM

## 2014-03-03 MED ORDER — GADOBENATE DIMEGLUMINE 529 MG/ML IV SOLN
12.0000 mL | Freq: Once | INTRAVENOUS | Status: AC | PRN
Start: 2014-03-03 — End: 2014-03-03
  Administered 2014-03-03: 12 mL via INTRAVENOUS

## 2014-03-03 NOTE — Telephone Encounter (Signed)
Spoke with the pt and notified BQ out of the office at this time and we will call her once results are reviewed  She verbalized understanding  Please advise thanks!

## 2014-03-03 NOTE — Telephone Encounter (Signed)
lmomtcb x1 for pt Need to make aware BQ not currently in office and will send message over to him to advise on results. Please advise thanks

## 2014-03-08 DIAGNOSIS — T148 Other injury of unspecified body region: Secondary | ICD-10-CM | POA: Diagnosis not present

## 2014-03-08 DIAGNOSIS — W57XXXA Bitten or stung by nonvenomous insect and other nonvenomous arthropods, initial encounter: Secondary | ICD-10-CM | POA: Diagnosis not present

## 2014-03-08 DIAGNOSIS — L819 Disorder of pigmentation, unspecified: Secondary | ICD-10-CM | POA: Diagnosis not present

## 2014-03-08 DIAGNOSIS — Z85828 Personal history of other malignant neoplasm of skin: Secondary | ICD-10-CM | POA: Diagnosis not present

## 2014-03-08 DIAGNOSIS — L570 Actinic keratosis: Secondary | ICD-10-CM | POA: Diagnosis not present

## 2014-03-08 DIAGNOSIS — D1801 Hemangioma of skin and subcutaneous tissue: Secondary | ICD-10-CM | POA: Diagnosis not present

## 2014-03-08 DIAGNOSIS — L909 Atrophic disorder of skin, unspecified: Secondary | ICD-10-CM | POA: Diagnosis not present

## 2014-03-08 DIAGNOSIS — L821 Other seborrheic keratosis: Secondary | ICD-10-CM | POA: Diagnosis not present

## 2014-03-08 DIAGNOSIS — L919 Hypertrophic disorder of the skin, unspecified: Secondary | ICD-10-CM | POA: Diagnosis not present

## 2014-03-10 DIAGNOSIS — R350 Frequency of micturition: Secondary | ICD-10-CM | POA: Diagnosis not present

## 2014-03-10 DIAGNOSIS — D3000 Benign neoplasm of unspecified kidney: Secondary | ICD-10-CM | POA: Diagnosis not present

## 2014-03-10 NOTE — Telephone Encounter (Signed)
Results have been explained to patient, pt expressed understanding. Nothing further needed.  

## 2014-03-10 NOTE — Telephone Encounter (Signed)
Pt calling again a/b the results of pft.[name]

## 2014-03-10 NOTE — Telephone Encounter (Signed)
ATC pt x2 - line busy.  WCB.

## 2014-03-10 NOTE — Telephone Encounter (Signed)
Please let her know that they were normal

## 2014-03-10 NOTE — Telephone Encounter (Signed)
Dr. Lake Bells, pft is in [place], and patient is requesting results.  Please advise on results.  Thank you.

## 2014-03-10 NOTE — Telephone Encounter (Signed)
Caryl Pina please advise if Dr Lake Bells has received and reviewed the patients PFT results. Pt calling requesting these results.Thanks.

## 2014-03-13 DIAGNOSIS — J029 Acute pharyngitis, unspecified: Secondary | ICD-10-CM | POA: Diagnosis not present

## 2014-04-06 DIAGNOSIS — M069 Rheumatoid arthritis, unspecified: Secondary | ICD-10-CM | POA: Diagnosis not present

## 2014-04-06 DIAGNOSIS — Z79899 Other long term (current) drug therapy: Secondary | ICD-10-CM | POA: Diagnosis not present

## 2014-04-06 DIAGNOSIS — M2550 Pain in unspecified joint: Secondary | ICD-10-CM | POA: Diagnosis not present

## 2014-04-13 ENCOUNTER — Encounter: Payer: Self-pay | Admitting: Podiatrist

## 2014-04-13 ENCOUNTER — Ambulatory Visit: Payer: Medicare Other | Admitting: Podiatry

## 2014-04-13 ENCOUNTER — Ambulatory Visit (INDEPENDENT_AMBULATORY_CARE_PROVIDER_SITE_OTHER): Payer: Medicare Other | Admitting: Podiatrist

## 2014-04-13 VITALS — BP 134/71 | HR 78 | Resp 18

## 2014-04-13 DIAGNOSIS — L84 Corns and callosities: Secondary | ICD-10-CM

## 2014-04-13 DIAGNOSIS — M069 Rheumatoid arthritis, unspecified: Secondary | ICD-10-CM

## 2014-04-13 DIAGNOSIS — M216X9 Other acquired deformities of unspecified foot: Secondary | ICD-10-CM

## 2014-04-13 NOTE — Progress Notes (Signed)
  Chief Complaint  Patient presents with  . Callouses    I NEED MY CALLUSES TRIMMED UP ON BOTH FEET     HPI: Patient is 80 y.o. female who presents today for painful calluses submetatarsal 2 left greater than right. The patient is a rheumatoid arthritic patient and develops these lesions regularly.     Physical Exam GENERAL APPEARANCE: Alert, conversant. Appropriately groomed. No acute distress.  VASCULAR: Pedal pulses palpable at 2/4 DP and PT bilateral.  Capillary refill time is immediate to all digits,  Proximal to distal temperature is warm to warm.  Digital hair growth is present bilateral  NEUROLOGIC: sensation is intact epicritically and protectively to 5.07 monofilament at 5/5 sites bilateral.  Light touch is intact bilateral, vibratory sensation intact bilateral, achilles tendon reflex is intact bilateral.  MUSCULOSKELETAL: Prominent and plantarflexed metatarsals are noted bilateral. Second metatarsals are the most severe. Contracture deformity of lesser digits is also seen.  DERMATOLOGIC: Hyperkeratotic lesions are present submetatarsal 2 bilateral. Intact integument is present is debridement.   Assessment: Hyperkeratotic lesions x2  Plan: Debridement of lesions was carried out today without complication. She'll be seen back in 10 weeks' for her routine care appointment.

## 2014-04-19 ENCOUNTER — Other Ambulatory Visit: Payer: Self-pay | Admitting: Family Medicine

## 2014-04-19 DIAGNOSIS — Z853 Personal history of malignant neoplasm of breast: Secondary | ICD-10-CM

## 2014-04-28 DIAGNOSIS — T6391XA Toxic effect of contact with unspecified venomous animal, accidental (unintentional), initial encounter: Secondary | ICD-10-CM | POA: Diagnosis not present

## 2014-05-04 DIAGNOSIS — M069 Rheumatoid arthritis, unspecified: Secondary | ICD-10-CM | POA: Diagnosis not present

## 2014-05-23 DIAGNOSIS — K6289 Other specified diseases of anus and rectum: Secondary | ICD-10-CM | POA: Diagnosis not present

## 2014-05-25 DIAGNOSIS — Z0000 Encounter for general adult medical examination without abnormal findings: Secondary | ICD-10-CM | POA: Diagnosis not present

## 2014-05-25 DIAGNOSIS — Z23 Encounter for immunization: Secondary | ICD-10-CM | POA: Diagnosis not present

## 2014-05-25 DIAGNOSIS — R7309 Other abnormal glucose: Secondary | ICD-10-CM | POA: Diagnosis not present

## 2014-05-25 DIAGNOSIS — M069 Rheumatoid arthritis, unspecified: Secondary | ICD-10-CM | POA: Diagnosis not present

## 2014-05-25 DIAGNOSIS — I10 Essential (primary) hypertension: Secondary | ICD-10-CM | POA: Diagnosis not present

## 2014-05-25 DIAGNOSIS — M1990 Unspecified osteoarthritis, unspecified site: Secondary | ICD-10-CM | POA: Diagnosis not present

## 2014-05-25 DIAGNOSIS — F411 Generalized anxiety disorder: Secondary | ICD-10-CM | POA: Diagnosis not present

## 2014-05-25 DIAGNOSIS — M810 Age-related osteoporosis without current pathological fracture: Secondary | ICD-10-CM | POA: Diagnosis not present

## 2014-05-25 DIAGNOSIS — E039 Hypothyroidism, unspecified: Secondary | ICD-10-CM | POA: Diagnosis not present

## 2014-05-25 DIAGNOSIS — E7800 Pure hypercholesterolemia, unspecified: Secondary | ICD-10-CM | POA: Diagnosis not present

## 2014-05-26 ENCOUNTER — Ambulatory Visit
Admission: RE | Admit: 2014-05-26 | Discharge: 2014-05-26 | Disposition: A | Discharge status: Home / Self Care | Payer: Medicare Other | Source: Ambulatory Visit | Attending: Family Medicine | Admitting: Family Medicine

## 2014-05-26 ENCOUNTER — Encounter (INDEPENDENT_AMBULATORY_CARE_PROVIDER_SITE_OTHER): Payer: Self-pay

## 2014-05-26 DIAGNOSIS — N6459 Other signs and symptoms in breast: Secondary | ICD-10-CM | POA: Diagnosis not present

## 2014-05-26 DIAGNOSIS — Z853 Personal history of malignant neoplasm of breast: Secondary | ICD-10-CM | POA: Diagnosis not present

## 2014-05-31 DIAGNOSIS — L089 Local infection of the skin and subcutaneous tissue, unspecified: Secondary | ICD-10-CM | POA: Diagnosis not present

## 2014-05-31 DIAGNOSIS — M069 Rheumatoid arthritis, unspecified: Secondary | ICD-10-CM | POA: Diagnosis not present

## 2014-05-31 DIAGNOSIS — Z79899 Other long term (current) drug therapy: Secondary | ICD-10-CM | POA: Diagnosis not present

## 2014-05-31 DIAGNOSIS — M2550 Pain in unspecified joint: Secondary | ICD-10-CM | POA: Diagnosis not present

## 2014-06-15 ENCOUNTER — Ambulatory Visit (INDEPENDENT_AMBULATORY_CARE_PROVIDER_SITE_OTHER): Payer: Medicare Other | Admitting: Podiatrist

## 2014-06-15 DIAGNOSIS — L84 Corns and callosities: Secondary | ICD-10-CM

## 2014-06-15 DIAGNOSIS — M216X9 Other acquired deformities of unspecified foot: Secondary | ICD-10-CM

## 2014-06-15 DIAGNOSIS — M069 Rheumatoid arthritis, unspecified: Secondary | ICD-10-CM

## 2014-06-15 NOTE — Patient Instructions (Signed)
Corns and Calluses Corns are small areas of thickened skin that usually occur on the top, sides, or tip of a toe. They contain a cone-shaped core with a point that can press on a nerve below. This causes pain. Calluses are areas of thickened skin that usually develop on hands, fingers, palms, soles of the feet, and heels. These are areas that experience frequent friction or pressure. CAUSES  Corns are usually the result of rubbing (friction) or pressure from shoes that are too tight or do not fit properly. Calluses are caused by repeated friction and pressure on the affected areas. SYMPTOMS  A hard growth on the skin.  Pain or tenderness under the skin.  Sometimes, redness and swelling.  Increased discomfort while wearing tight-fitting shoes. DIAGNOSIS  Your caregiver can usually tell what the problem is by doing a physical exam. TREATMENT  Removing the cause of the friction or pressure is usually the only treatment needed. However, sometimes medicines can be used to help soften the hardened, thickened areas. These medicines include salicylic acid plasters and 12% ammonium lactate lotion. These medicines should only be used under the direction of your caregiver. HOME CARE INSTRUCTIONS   Try to remove pressure from the affected area.  You may wear donut-shaped corn pads to protect your skin.  You may use a pumice stone or nonmetallic nail file to gently reduce the thickness of a corn.  Wear properly fitted footwear.  If you have calluses on the hands, wear gloves during activities that cause friction.  If you have diabetes, you should regularly examine your feet. Tell your caregiver if you notice any problems with your feet. SEEK IMMEDIATE MEDICAL CARE IF:   You have increased pain, swelling, redness, or warmth in the affected area.  Your corn or callus starts to drain fluid or bleeds.  You are not getting better, even with treatment. Document Released: 07/05/2004 Document  Revised: 12/22/2011 Document Reviewed: 05/27/2011 Exit[employer] Patient Information 2015 [employer]. This information is not intended to replace advice given to you by your health care provider. Make sure you discuss any questions you have with your health care provider.  

## 2014-06-19 NOTE — Progress Notes (Signed)
HPI: Patient is 80 y.o. female who presents today for painful calluses submetatarsal 2 left greater than right. The patient is a rheumatoid arthritic patient and develops these lesions regularly.  Physical Exam  GENERAL APPEARANCE: Alert, conversant. Appropriately groomed. No acute distress.  VASCULAR: Pedal pulses palpable at 2/4 DP and PT bilateral. Capillary refill time is immediate to all digits, Proximal to distal temperature is warm to warm. Digital hair growth is present bilateral  NEUROLOGIC: sensation is intact epicritically and protectively to 5.07 monofilament at 5/5 sites bilateral. Light touch is intact bilateral, vibratory sensation intact bilateral, achilles tendon reflex is intact bilateral.  MUSCULOSKELETAL: Prominent and plantarflexed metatarsals are noted bilateral. Second metatarsals are the most severe. Contracture deformity of lesser digits is also seen.  DERMATOLOGIC: Hyperkeratotic lesions are present submetatarsal 2 bilateral. Intact integument is present is debridement.  Assessment: Hyperkeratotic lesions x2  Plan: Debridement of lesions was carried out today without complication. She'll be seen back in 10 weeks' for her routine care appointment.

## 2014-06-22 ENCOUNTER — Ambulatory Visit: Payer: Medicare Other | Admitting: Podiatrist

## 2014-06-26 DIAGNOSIS — M810 Age-related osteoporosis without current pathological fracture: Secondary | ICD-10-CM | POA: Diagnosis not present

## 2014-06-29 DIAGNOSIS — Z23 Encounter for immunization: Secondary | ICD-10-CM | POA: Diagnosis not present

## 2014-07-07 DIAGNOSIS — L089 Local infection of the skin and subcutaneous tissue, unspecified: Secondary | ICD-10-CM | POA: Diagnosis not present

## 2014-07-07 DIAGNOSIS — Z79899 Other long term (current) drug therapy: Secondary | ICD-10-CM | POA: Diagnosis not present

## 2014-07-07 DIAGNOSIS — M069 Rheumatoid arthritis, unspecified: Secondary | ICD-10-CM | POA: Diagnosis not present

## 2014-07-07 DIAGNOSIS — M2550 Pain in unspecified joint: Secondary | ICD-10-CM | POA: Diagnosis not present

## 2014-07-11 ENCOUNTER — Ambulatory Visit (INDEPENDENT_AMBULATORY_CARE_PROVIDER_SITE_OTHER): Payer: Medicare Other | Admitting: Pulmonary Disease

## 2014-07-11 ENCOUNTER — Encounter: Payer: Self-pay | Admitting: Pulmonary Disease

## 2014-07-11 VITALS — BP 148/86 | HR 91 | Ht 61.0 in | Wt 134.0 lb

## 2014-07-11 DIAGNOSIS — R0989 Other specified symptoms and signs involving the circulatory and respiratory systems: Secondary | ICD-10-CM

## 2014-07-11 DIAGNOSIS — R0609 Other forms of dyspnea: Secondary | ICD-10-CM

## 2014-07-11 NOTE — Assessment & Plan Note (Signed)
[name]'s pulmonary function testing was completely normal without evidence of lung disease. Further, her exam and oximetry continued to be normal. Her chest x-ray was normal. I explained to her that this effectively rules out greater than 95% of any underlying lung disease. Because of her ongoing symptoms I will order an echocardiogram to evaluate for pulmonary hypertension. If that test is normal then we can proceed with a cardiopulmonary exercise test. However, at that point if all the testing is normal then the likely that we're going to come up with a partner lung problem as a cause for her dyspnea is very low. Fortunately, her functional status seems to be stable.  Plan: -Echocardiogram -If echo normal than CPET - If CPET normal then we'll follow with every 6 month visits with pulmonary function testing - Followup 6 weeks

## 2014-07-11 NOTE — Progress Notes (Signed)
Subjective:    Patient ID: [name], female    DOB: 06/14/1934, 80 y.o.   MRN: [id number]   Synopsis: This is a pleasant lifelong nonsmoker who first saw the  pulmonary in 2015 for shortness of breath. She has a past medical history significant for rheumatoid arthritis and has been on long-standing methotrexate for years. Lung function testing in 2015 was completely normal.  February 2015 chest x-ray normal April 2015 left catheterization> mild nonobstructive coronary disease 02/2014 PFT> Ratio 75%, FEV1 1.45L (88% pred, 4% pred), TLC 5.06 L (113% pred), DLCO 15.59 (82% pred)  HPI  07/11/2014 ROV > [name] says that he had some dyspnea when is was particularly humid in  The last few weeks.  She had an infection in her foot which slowed down her exercise this summer.  She would occasionally have to sop with her walks for exercise whic was associated with a tightness in her chest.  It wasn't sharp or squeezing.  She says that it feels like her chest is "full". She definitely feels worse on exertion in humid weather.  No cough or wheeze.  She had a flu shot two weeks ago.  After her recent flu shot she had a severe reaction, in that she had a fast heart rate, body aches, and some mild shortness of breath for about a week after the injection.  Past Medical History  Diagnosis Date  . Rheumatoid arthritis(714.0)   . Hypothyroidism   . Breast cancer 2008    RT, chemo, lumpectomy  . UTI (lower urinary tract infection)   . Diverticulitis   . Kidney tumor 2010    RFA  . HTN (hypertension)   . Hyperlipidemia   . DVT (deep vein thrombosis) in pregnancy     site of PICC catheter  . Prediabetes   . Abnormal cardiovascular stress test 12/2013    s/p cath with mild nonobstructive CAD normal EF - managed medically     Review of Systems  Constitutional: Negative for fever, chills and fatigue.  HENT: Negative for postnasal drip, rhinorrhea and sinus pressure.   Respiratory: Positive for  shortness of breath. Negative for cough and wheezing.   Cardiovascular: Negative for chest pain, palpitations and leg swelling.       Objective:   Physical Exam Filed Vitals:   07/11/14 1153  BP: 148/86  Pulse: 91  Height: 5\' 1"  (1.549 m)  Weight: 134 lb (60.782 kg)  SpO2: 97%   RA  Gen: well appearing, no acute distress HEENT: NCAT, EOMi, OP clear PULM: CTA B CV: RRR, no mgr, no JVD AB: BS+, soft, nontender Ext: warm, no edema, no clubbing, no cyanosis Derm: no rash or skin breakdown Neuro: A&Ox4, MAEW        Assessment & Plan:   Dyspnea on exertion [name]'s pulmonary function testing was completely normal without evidence of lung disease. Further, her exam and oximetry continued to be normal. Her chest x-ray was normal. I explained to her that this effectively rules out greater than 95% of any underlying lung disease. Because of her ongoing symptoms I will order an echocardiogram to evaluate for pulmonary hypertension. If that test is normal then we can proceed with a cardiopulmonary exercise test. However, at that point if all the testing is normal then the likely that we're going to come up with a partner lung problem as a cause for her dyspnea is very low. Fortunately, her functional status seems to be stable.  Plan: -Echocardiogram -If echo  normal than CPET - If CPET normal then we'll follow with every 6 month visits with pulmonary function testing - Followup 6 weeks    Updated Medication List Outpatient Encounter Prescriptions as of 07/11/2014  Medication Sig  . acetaminophen-codeine (TYLENOL #3) 300-30 MG per tablet Take 1 tablet by mouth every 4 (four) hours as needed for moderate pain.   Marland Kitchen ALPRAZolam (XANAX) 0.25 MG tablet Take 0.125 mg by mouth daily as needed for anxiety.   . Calcium Citrate-Vitamin D (CITRACAL + D PO) Take 1 tablet by mouth daily.  . cholecalciferol (VITAMIN D) 1000 UNITS tablet Take 1,000 Units by mouth daily.  Marland Kitchen dicyclomine (BENTYL) 10  MG capsule Take 10 mg by mouth daily as needed for spasms.   . fish oil-omega-3 fatty acids 1000 MG capsule Take 1,200 mg by mouth daily.   . folic acid (FOLVITE) 1 MG tablet Take 2 mg by mouth daily.   Marland Kitchen levothyroxine (SYNTHROID, LEVOTHROID) 150 MCG tablet Take 150 mcg by mouth daily before breakfast.  . Melatonin 2.5 MG CAPS Take 2.5 mg by mouth at bedtime as needed.   . methotrexate (50 MG/ML) 1 G injection Inject 0.8 mg into the vein once a week. Sunday  . Multiple Vitamin (MULTIVITAMIN) capsule Take 1 capsule by mouth daily.  . naproxen (NAPRELAN) 500 MG 24 hr tablet Take 500 mg by mouth daily with breakfast.   . Polyethylene Glycol 3350 (MIRALAX PO) Take by mouth See admin instructions. 3/4 a capful once daily  . potassium chloride SA (K-DUR,KLOR-CON) 20 MEQ tablet Take 20 mEq by mouth daily.  . ranitidine (ZANTAC) 75 MG tablet Take 75 mg by mouth as needed for heartburn.  . simvastatin (ZOCOR) 20 MG tablet Take 20 mg by mouth every evening.  . triamterene-hydrochlorothiazide (MAXZIDE-25) 37.5-25 MG per tablet Take 1 tablet by mouth daily.  . [DISCONTINUED] sulfaSALAzine (AZULFIDINE) 500 MG tablet

## 2014-07-11 NOTE — Patient Instructions (Signed)
We will arrange an echocardiogram, if that is normal we will arrange the CPET (cardiopulmonary exercise test) We will see you back in 6 weeks or sooner if needed

## 2014-07-18 ENCOUNTER — Ambulatory Visit (HOSPITAL_COMMUNITY): Payer: Medicare Other | Attending: Pulmonary Disease | Admitting: Radiology

## 2014-07-18 ENCOUNTER — Encounter: Payer: Self-pay | Admitting: Pulmonary Disease

## 2014-07-18 DIAGNOSIS — E785 Hyperlipidemia, unspecified: Secondary | ICD-10-CM | POA: Insufficient documentation

## 2014-07-18 DIAGNOSIS — R0609 Other forms of dyspnea: Secondary | ICD-10-CM

## 2014-07-18 DIAGNOSIS — I10 Essential (primary) hypertension: Secondary | ICD-10-CM | POA: Insufficient documentation

## 2014-07-18 DIAGNOSIS — R0600 Dyspnea, unspecified: Secondary | ICD-10-CM | POA: Diagnosis not present

## 2014-07-18 NOTE — Progress Notes (Signed)
Echocardiogram performed.  

## 2014-07-18 NOTE — Progress Notes (Signed)
Quick Note:  Spoke with pt, she is aware of results and recs. Nothing further needed. ______ 

## 2014-08-10 ENCOUNTER — Telehealth: Payer: Self-pay | Admitting: Pulmonary Disease

## 2014-08-10 DIAGNOSIS — R0609 Other forms of dyspnea: Principal | ICD-10-CM

## 2014-08-10 NOTE — Telephone Encounter (Signed)
Yes please order CPET: reason dyspnea

## 2014-08-10 NOTE — Telephone Encounter (Signed)
Order has been placed for the CPET and i called the pt and she is aware that we will get this scheduled for her.

## 2014-08-10 NOTE — Telephone Encounter (Signed)
Per 07/11/14 OV; Patient Instructions      We will arrange an echocardiogram, if that is normal we will arrange the CPET (cardiopulmonary exercise test) We will see you back in 6 weeks or sooner if needed    ---  Per echo results: Notes Recorded by Juanito Doom, MD on 07/18/2014 at 1:10 PM Please let her know that this was normal  CPET has not been ordered yet. Please advise if this still needs to be done since no mention of this on echo results. Please advise thanks

## 2014-08-15 ENCOUNTER — Ambulatory Visit: Payer: Medicare Other | Admitting: Pulmonary Disease

## 2014-08-17 ENCOUNTER — Encounter: Payer: Self-pay | Admitting: Podiatrist

## 2014-08-17 ENCOUNTER — Ambulatory Visit (INDEPENDENT_AMBULATORY_CARE_PROVIDER_SITE_OTHER): Payer: Medicare Other | Admitting: Podiatrist

## 2014-08-17 DIAGNOSIS — Q667 Congenital pes cavus: Secondary | ICD-10-CM | POA: Diagnosis not present

## 2014-08-17 DIAGNOSIS — M216X9 Other acquired deformities of unspecified foot: Secondary | ICD-10-CM | POA: Diagnosis not present

## 2014-08-17 DIAGNOSIS — M069 Rheumatoid arthritis, unspecified: Secondary | ICD-10-CM

## 2014-08-17 DIAGNOSIS — L84 Corns and callosities: Secondary | ICD-10-CM

## 2014-08-22 NOTE — Progress Notes (Signed)
  HPI: Patient is 80 y.o. female who presents today for painful calluses submetatarsal 2 left greater than right. The patient is a rheumatoid arthritic patient and develops these lesions regularly.  Physical Exam  GENERAL APPEARANCE: Alert, conversant. Appropriately groomed. No acute distress.  VASCULAR: Pedal pulses palpable at 2/4 DP and PT bilateral. Capillary refill time is immediate to all digits, Proximal to distal temperature is warm to warm. Digital hair growth is present bilateral  NEUROLOGIC: sensation is intact epicritically and protectively to 5.07 monofilament at 5/5 sites bilateral. Light touch is intact bilateral, vibratory sensation intact bilateral, achilles tendon reflex is intact bilateral.  MUSCULOSKELETAL: Prominent and plantarflexed metatarsals are noted bilateral. Second metatarsals are the most severe. Contracture deformity of lesser digits is also seen.  DERMATOLOGIC: Hyperkeratotic lesions are present submetatarsal 2 bilateral. Intact integument is present is debridement.  Assessment: Hyperkeratotic lesions x2  Plan: Debridement of lesions was carried out today without complication. She'll be seen back in 10 weeks' for her routine care appointment.

## 2014-09-14 DIAGNOSIS — J069 Acute upper respiratory infection, unspecified: Secondary | ICD-10-CM | POA: Diagnosis not present

## 2014-09-21 ENCOUNTER — Encounter (HOSPITAL_COMMUNITY): Payer: Self-pay | Admitting: Cardiology

## 2014-10-17 DIAGNOSIS — M0579 Rheumatoid arthritis with rheumatoid factor of multiple sites without organ or systems involvement: Secondary | ICD-10-CM | POA: Diagnosis not present

## 2014-10-17 DIAGNOSIS — Z79899 Other long term (current) drug therapy: Secondary | ICD-10-CM | POA: Diagnosis not present

## 2014-10-17 DIAGNOSIS — M2550 Pain in unspecified joint: Secondary | ICD-10-CM | POA: Diagnosis not present

## 2014-10-18 DIAGNOSIS — Z85828 Personal history of other malignant neoplasm of skin: Secondary | ICD-10-CM | POA: Diagnosis not present

## 2014-10-18 DIAGNOSIS — L820 Inflamed seborrheic keratosis: Secondary | ICD-10-CM | POA: Diagnosis not present

## 2014-10-18 DIAGNOSIS — L570 Actinic keratosis: Secondary | ICD-10-CM | POA: Diagnosis not present

## 2014-10-26 ENCOUNTER — Encounter (HOSPITAL_COMMUNITY): Payer: Medicare Other

## 2014-10-26 DIAGNOSIS — L02611 Cutaneous abscess of right foot: Secondary | ICD-10-CM | POA: Diagnosis not present

## 2014-10-27 ENCOUNTER — Encounter: Payer: Self-pay | Admitting: Podiatrist

## 2014-10-27 ENCOUNTER — Encounter (HOSPITAL_COMMUNITY): Payer: Medicare Other

## 2014-10-27 ENCOUNTER — Ambulatory Visit (INDEPENDENT_AMBULATORY_CARE_PROVIDER_SITE_OTHER): Payer: Medicare Other | Admitting: Podiatrist

## 2014-10-27 VITALS — BP 154/82 | HR 88 | Resp 16

## 2014-10-27 DIAGNOSIS — L03119 Cellulitis of unspecified part of limb: Secondary | ICD-10-CM | POA: Diagnosis not present

## 2014-10-27 DIAGNOSIS — L02619 Cutaneous abscess of unspecified foot: Secondary | ICD-10-CM | POA: Diagnosis not present

## 2014-10-27 NOTE — Patient Instructions (Signed)
Soak in concentrated epsom salt solution 2 times a day for 3-4 days then once a day thereafter  Dry your foot and apply a pea sized amount of the orange medication.  Then cover with a bandaid.  If you notice any redness, swelling or increased pain, let me know

## 2014-10-27 NOTE — Progress Notes (Signed)
Chief Complaint  Patient presents with  . Foot Ulcer    plantar right foot  "I noticed this spot on the bottom of my foot on Tuesday night. I went to my doctor and she put me on an antibiotic"     HPI: Patient is 81 y.o. female who presents today for a painful abscess or lesion on the plantar aspect of the right foot. She relates that she may have stepped on some birdfeeder cat litter but she doesn't recall any significant incident.  She relates pain in an area of blood-tinged discoloration which is since turned probably black on the bottom of her foot she saw her primary care physician who tried to incise the area but was unable to get anything out and she put her on antibiotic and recommended she be seen as soon as possible. She denies any systemic signs or symptoms of infection.  Allergies  Allergen Reactions  . Dilaudid [Hydromorphone Hcl] Nausea And Vomiting  . Morphine And Related   . Phenergan [Promethazine Hcl]   . Tamiflu [Oseltamivir Phosphate]     rash  . Taxol [Paclitaxel]     rash  . Tetanus Toxoids     Physical Exam  Neurovascular status is intact and unchanged from the previous visit with palpable pedal pulses present and neurological sensation intact. Prominent and plantar flexed metatarsals 2, 3, 4 of the right foot are noted. She has a callus and just distal to the callus towards the base of the third toe is a discrete abscess present. Once lanced there is a moderate amount of pus that is extruded. The abscess does appear to communicate with the central portion of the callus on the right foot. No redness, no swelling, no sign of infection is present beyond the abscess itself.   Assessment: Abscess plantar right foot  Plan: A sterile blade was used to [place] the abscess. The area was then debrided thoroughly to allow a channel for drainage. Iodosorb was applied to the tissue followed by a dressing and the patient was instructed on home care with the use of Iodosorb and a  Band-Aid dressings. She will soak in Epsom salt soaks twice daily. She will continue to take the antibiotic as instructed. I will see her back next week for her routine care appointment already scheduled for follow-up. If any concerns, redness, swelling or sign of infection arise prior to that visit she is instructed to call.

## 2014-11-02 ENCOUNTER — Ambulatory Visit (INDEPENDENT_AMBULATORY_CARE_PROVIDER_SITE_OTHER): Payer: Medicare Other | Admitting: Podiatrist

## 2014-11-02 ENCOUNTER — Encounter: Payer: Self-pay | Admitting: Podiatrist

## 2014-11-02 VITALS — BP 174/89 | HR 100 | Resp 16

## 2014-11-02 DIAGNOSIS — L84 Corns and callosities: Secondary | ICD-10-CM

## 2014-11-02 NOTE — Progress Notes (Signed)
Chief Complaint  Patient presents with  . Foot Ulcer    Follow up plantar right foot   "Doing much better"  . Callouses    "She is going to trim up the calluses today"     HPI: Patient is 81 y.o. female who presents today for a painful abscess or lesion on the plantar aspect of the right foot. She relates that she may have stepped on some birdfeeder cat litter but she doesn't recall any significant incident.  She relates pain in an area of blood-tinged discoloration which is since turned probably black on the bottom of her foot she saw her primary care physician who tried to incise the area but was unable to get anything out and she put her on antibiotic and recommended she be seen as soon as possible. She denies any systemic signs or symptoms of infection.  Allergies  Allergen Reactions  . Dilaudid [Hydromorphone Hcl] Nausea And Vomiting  . Morphine And Related   . Phenergan [Promethazine Hcl]   . Tamiflu [Oseltamivir Phosphate]     rash  . Taxol [Paclitaxel]     rash  . Tetanus Toxoids     Physical Exam  Neurovascular status is intact and unchanged from the previous visit with palpable pedal pulses present and neurological sensation intact. Prominent and plantar flexed metatarsals 2, 3, 4 of the right foot are noted. She has a callus and just distal to the callus towards the base of the third toe is a discrete abscess present. Once lanced there is a moderate amount of pus that is extruded. The abscess does appear to communicate with the central portion of the callus on the right foot. No redness, no swelling, no sign of infection is present beyond the abscess itself.   Assessment: Abscess plantar right foot  Plan: A sterile blade was used to [place] the abscess. The area was then debrided thoroughly to allow a channel for drainage. Iodosorb was applied to the tissue followed by a dressing and the patient was instructed on home care with the use of Iodosorb and a Band-Aid dressings. She  will soak in Epsom salt soaks twice daily. She will continue to take the antibiotic as instructed. I will see her back next week for her routine care appointment already scheduled for follow-up. If any concerns, redness, swelling or sign of infection arise prior to that visit she is instructed to call.

## 2014-11-15 DIAGNOSIS — M549 Dorsalgia, unspecified: Secondary | ICD-10-CM | POA: Diagnosis not present

## 2014-11-15 DIAGNOSIS — M810 Age-related osteoporosis without current pathological fracture: Secondary | ICD-10-CM | POA: Diagnosis not present

## 2014-11-16 ENCOUNTER — Ambulatory Visit
Admission: RE | Admit: 2014-11-16 | Discharge: 2014-11-16 | Disposition: A | Discharge status: Home / Self Care | Payer: Medicare Other | Source: Ambulatory Visit | Attending: Internal Medicine | Admitting: Internal Medicine

## 2014-11-16 ENCOUNTER — Other Ambulatory Visit: Payer: Self-pay | Admitting: Internal Medicine

## 2014-11-16 DIAGNOSIS — M549 Dorsalgia, unspecified: Secondary | ICD-10-CM

## 2014-11-16 DIAGNOSIS — M5134 Other intervertebral disc degeneration, thoracic region: Secondary | ICD-10-CM | POA: Diagnosis not present

## 2014-11-16 DIAGNOSIS — M4185 Other forms of scoliosis, thoracolumbar region: Secondary | ICD-10-CM | POA: Diagnosis not present

## 2014-11-23 ENCOUNTER — Ambulatory Visit
Admission: RE | Admit: 2014-11-23 | Discharge: 2014-11-23 | Disposition: A | Discharge status: Home / Self Care | Payer: Medicare Other | Source: Ambulatory Visit | Attending: Internal Medicine | Admitting: Internal Medicine

## 2014-11-23 ENCOUNTER — Other Ambulatory Visit: Payer: Self-pay | Admitting: Internal Medicine

## 2014-11-23 DIAGNOSIS — Z8781 Personal history of (healed) traumatic fracture: Secondary | ICD-10-CM

## 2014-11-23 DIAGNOSIS — M545 Low back pain: Secondary | ICD-10-CM

## 2014-11-23 DIAGNOSIS — M810 Age-related osteoporosis without current pathological fracture: Secondary | ICD-10-CM

## 2014-11-23 DIAGNOSIS — M47816 Spondylosis without myelopathy or radiculopathy, lumbar region: Secondary | ICD-10-CM | POA: Diagnosis not present

## 2014-11-30 DIAGNOSIS — E780 Pure hypercholesterolemia: Secondary | ICD-10-CM | POA: Diagnosis not present

## 2014-11-30 DIAGNOSIS — F419 Anxiety disorder, unspecified: Secondary | ICD-10-CM | POA: Diagnosis not present

## 2014-11-30 DIAGNOSIS — R7309 Other abnormal glucose: Secondary | ICD-10-CM | POA: Diagnosis not present

## 2014-11-30 DIAGNOSIS — I10 Essential (primary) hypertension: Secondary | ICD-10-CM | POA: Diagnosis not present

## 2014-11-30 DIAGNOSIS — E039 Hypothyroidism, unspecified: Secondary | ICD-10-CM | POA: Diagnosis not present

## 2014-12-11 ENCOUNTER — Encounter: Payer: Self-pay | Admitting: Physical Therapy

## 2014-12-11 ENCOUNTER — Ambulatory Visit: Payer: Medicare Other | Attending: Internal Medicine | Admitting: Physical Therapy

## 2014-12-11 DIAGNOSIS — M810 Age-related osteoporosis without current pathological fracture: Secondary | ICD-10-CM | POA: Diagnosis not present

## 2014-12-11 DIAGNOSIS — E039 Hypothyroidism, unspecified: Secondary | ICD-10-CM | POA: Diagnosis not present

## 2014-12-11 DIAGNOSIS — I10 Essential (primary) hypertension: Secondary | ICD-10-CM | POA: Diagnosis not present

## 2014-12-11 DIAGNOSIS — R531 Weakness: Secondary | ICD-10-CM

## 2014-12-11 DIAGNOSIS — M069 Rheumatoid arthritis, unspecified: Secondary | ICD-10-CM | POA: Diagnosis not present

## 2014-12-11 DIAGNOSIS — E785 Hyperlipidemia, unspecified: Secondary | ICD-10-CM | POA: Insufficient documentation

## 2014-12-11 DIAGNOSIS — M6281 Muscle weakness (generalized): Secondary | ICD-10-CM | POA: Insufficient documentation

## 2014-12-11 DIAGNOSIS — Z86718 Personal history of other venous thrombosis and embolism: Secondary | ICD-10-CM | POA: Insufficient documentation

## 2014-12-11 DIAGNOSIS — Z853 Personal history of malignant neoplasm of breast: Secondary | ICD-10-CM | POA: Insufficient documentation

## 2014-12-11 DIAGNOSIS — R29898 Other symptoms and signs involving the musculoskeletal system: Secondary | ICD-10-CM | POA: Diagnosis not present

## 2014-12-11 NOTE — Therapy (Signed)
[facility] [address], [place], [place], [address] Phone: [phone]   Fax:  [phone]  Physical Therapy Evaluation  Patient Details  Name: [name] MRN: [id number] Date of Birth: 02/29/1934 Referring Provider:  Delrae Rend, MD  Encounter Date: 12/11/2014      PT End of Session - 12/11/14 1304    Visit Number 1   Number of Visits 16   Date for PT Re-Evaluation 02/05/15   PT Start Time 1217   PT Stop Time 1300   PT Time Calculation (min) 43 min      Past Medical History  Diagnosis Date  . Rheumatoid arthritis(714.0)   . Hypothyroidism   . Breast cancer 2008    RT, chemo, lumpectomy  . UTI (lower urinary tract infection)   . Diverticulitis   . Kidney tumor 2010    RFA  . HTN (hypertension)   . Hyperlipidemia   . DVT (deep vein thrombosis) in pregnancy     site of PICC catheter  . Prediabetes   . Abnormal cardiovascular stress test 12/2013    s/p cath with mild nonobstructive CAD normal EF - managed medically    Past Surgical History  Procedure Laterality Date  . Tonsillectomy    . Ovarian cyst removal    . Breast lumpectomy    . Abdominal surgery      for ruptured diverticuli  . Cervical spine surgery    . Bilateral foot surgery    . Renal rfa    . Perforation of colon    . Take down of colonostomy  2011  . Cardiac catheterization    . Left heart catheterization with coronary angiogram N/A 01/17/2014    Procedure: LEFT HEART CATHETERIZATION WITH CORONARY ANGIOGRAM;  Surgeon: Peter M Martinique, MD;  Location: [facility] CATH LAB;  Service: Cardiovascular;  Laterality: N/A;    There were no vitals taken for this visit.  Visit Diagnosis:  Decreased strength of trunk and back - Plan: PT plan of care cert/re-cert  Decreased strength - Plan: PT plan of care cert/re-cert      Subjective Assessment - 12/11/14 1229    Symptoms Patient has a T-score is -2.7 with diagnosis of Osteoporosis. Patient  was unable to take Fosamax due to GERD.  Patient reports she will start on Prolia in the future.    Pertinent History Patient has cervical surgery 1994 making it difficult to lift. Difficulty with walking due to Rheumatoid arthritis   Limitations Lifting;Walking   Patient Stated Goals Relief from pain, less stiff   Currently in Pain? Yes   Pain Score 5    Pain Location Neck  shoulders, middle of back and low back   Pain Orientation Mid;Lower   Pain Descriptors / Indicators Nagging   Pain Type Chronic pain   Pain Onset More than a month ago   Pain Frequency Constant   Aggravating Factors  pain worse in the morning, lifting heavy items, reaching   Pain Relieving Factors Naproxen; Tylenol 3   Effect of Pain on Daily Activities difficulty with walking and lifting   Multiple Pain Sites No          [facility] PT Assessment - 12/11/14 0001    Assessment   Medical Diagnosis Osteoporosis   Onset Date 11/15/14   Next MD Visit 6 months   Prior Therapy No   Precautions   Precautions Other (comment)   Precaution Comments No forward bending; No joint mobilization   Balance  Screen   Has the patient fallen in the past 6 months No   Has the patient had a decrease in activity level because of a fear of falling?  No   Is the patient reluctant to leave their home because of a fear of falling?  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; increased cervical thoracic kyphosis; increased thoracic kyphosis, forward head; flat lumbar spine; hips shifted to the right   AROM   Cervical Flexion full   Cervical Extension decreased by 25%   Cervical - Right Side Bend decreased by 25%   Cervical - Left Side Bend decreased by 25%   Cervical - Right Rotation decreased by 25%   Cervical - Left Rotation decreased by 25%   Lumbar Extension decreased by 75%    Lumbar - Right Side Bend decreased 50%   Lumbar - Left Side Bend decreased by 50%   Strength   Overall Strength Within functional limits for tasks performed   Overall Strength Comments --  trunk strength is 3/5   Right/Left Hip --  Bilateral hip extension and abduction 4/5.   Flexibility   Hamstrings tightness   Bed Mobility   Rolling Left 6: Modified independent (Device/Increase time)  takes more time   Ambulation/Gait   Stairs Yes   Stairs Assistance 7: Independent   Gait Comments No difficullty with ambulation except pain will limit for distance   High Level Balance   High Level Balance Activities --  No difficulty with high level activities                          PT Education - 12/11/14 1304    Education provided Yes   Person(s) Educated Patient   Methods Explanation;Handout   Comprehension Verbalized understanding          PT Short Term Goals - 12/11/14 1308    PT SHORT TERM GOAL #1   Title understand ways to manage her osteoporosis with diet   Time 4   Period Weeks   Status New   PT SHORT TERM GOAL #2   Title understand what postures to avoid to decrease spinal fractures.   Time 4   Period Weeks   Status New   PT SHORT TERM GOAL #3   Title cervical and lumbar pain reduced >/= 25%   Time 4   Period Weeks   Status New           PT Long Term Goals - 12/11/14 1310    PT LONG TERM GOAL #1   Title return demostation on correct lifting and perform daily activities with correct posture   Time 8   Period Weeks   Status New   PT LONG TERM GOAL #2   Title cervical and lumbar pain decreased >/= 50% due to understanding ways to manage pain   Time 8   Period Weeks   Status New   PT LONG TERM GOAL #3   Title understand home exercise program for trunk and extremity strengthening with correct postures   Time 8   Period Weeks   Status New               Plan - 12/11/14 1305    Clinical Impression Statement Patient has limited  understranding of ways to perform activities to reduce  strain on her spiine.  Patient has limited cervical and lumbar range of motion with decreased strength. Patients walking decreased due to pain in feet .   Pt will benefit from skilled therapeutic intervention in order to improve on the following deficits Difficulty walking;Decreased activity tolerance;Decreased mobility;Decreased strength;Pain;Postural dysfunction   Rehab Potential Good   PT Frequency 2x / week  May come 1 time per week due to schedule   PT Duration 8 weeks   PT Treatment/Interventions Therapeutic activities;Moist Heat;Patient/family education;Therapeutic exercise;Ultrasound;Gait training;Manual techniques;Cryotherapy;Electrical Stimulation   PT Next Visit Plan Instruction on correct body mechanics to reduce strain on spine   PT Home Exercise Plan trunk strengthening   Consulted and Agree with Plan of Care Patient          G-Codes - 01/04/15 1313    Functional Assessment Tool Used Therapist discretion from the initial evaluation   Functional Limitation Other PT primary   Other PT Primary Current Status (H4742) At least 60 percent but less than 80 percent impaired, limited or restricted   Other PT Primary Goal Status (V9563) At least 20 percent but less than 40 percent impaired, limited or restricted       Problem List Patient Active Problem List   Diagnosis Date Noted  . Dyspnea on exertion 12/30/2013  . Chest pain on exertion 12/30/2013  . HTN (hypertension) 12/30/2013  . Hyperlipidemia 12/30/2013  . Rheumatoid arthritis   . Hypothyroidism   . Breast cancer     GRAY,CHERYL, PT 01-04-2015, 1:19 PM  [facility] [address], [place], [place], [address] Phone: [phone]   Fax:  [phone]

## 2014-12-11 NOTE — Patient Instructions (Signed)
Physical therapist gave patient information on Osteoporosis, Do's and Don't of Osteoporosis, What postures to avoid.Patient verbally understands information.

## 2014-12-20 ENCOUNTER — Ambulatory Visit: Payer: Medicare Other | Attending: Internal Medicine | Admitting: Physical Therapy

## 2014-12-20 ENCOUNTER — Encounter: Payer: Self-pay | Admitting: Physical Therapy

## 2014-12-20 DIAGNOSIS — Z853 Personal history of malignant neoplasm of breast: Secondary | ICD-10-CM | POA: Diagnosis not present

## 2014-12-20 DIAGNOSIS — M6281 Muscle weakness (generalized): Secondary | ICD-10-CM | POA: Diagnosis not present

## 2014-12-20 DIAGNOSIS — E039 Hypothyroidism, unspecified: Secondary | ICD-10-CM | POA: Diagnosis not present

## 2014-12-20 DIAGNOSIS — E785 Hyperlipidemia, unspecified: Secondary | ICD-10-CM | POA: Diagnosis not present

## 2014-12-20 DIAGNOSIS — R29898 Other symptoms and signs involving the musculoskeletal system: Secondary | ICD-10-CM | POA: Diagnosis not present

## 2014-12-20 DIAGNOSIS — M069 Rheumatoid arthritis, unspecified: Secondary | ICD-10-CM | POA: Insufficient documentation

## 2014-12-20 DIAGNOSIS — Z86718 Personal history of other venous thrombosis and embolism: Secondary | ICD-10-CM | POA: Diagnosis not present

## 2014-12-20 DIAGNOSIS — I10 Essential (primary) hypertension: Secondary | ICD-10-CM | POA: Diagnosis not present

## 2014-12-20 DIAGNOSIS — M810 Age-related osteoporosis without current pathological fracture: Secondary | ICD-10-CM | POA: Insufficient documentation

## 2014-12-20 NOTE — Patient Instructions (Addendum)
RE-ALIGNMENT ROUTINE EXERCISES-OSTEOPROROSIS BASIC FOR POSTURAL CORRECTION   RE-ALIGNMENT Tips BENEFITS: 1.It helps to re-align the curves of the back and improve standing posture. 2.It allows the back muscles to rest and strengthen in preparation for more activity. FREQUENCY: Daily, even after weeks, months and years of more advanced exercises. START: 1.All exercises start in the same position: lying on the back, arms resting on the supporting surface, palms up and slightly away from the body, backs of hands down, knees bent, feet flat. 2.The head, neck, arms, and legs are supported according to specific instructions of your therapist. Copyright  VHI. All rights reserved.    1. Decompression Exercise: Basic.   Takes compression off the vertebral bodies; increases tolerance for lying on the back; helps relieve back pain   Lie on back on firm surface, knees bent, feet flat, arms turned up, out to sides (~35 degrees). Head neck and rams supported as necessary. Time _5-15__ minutes. Surface: floor     2. Shoulder Press  Strengthens upper back extensors and scapular retractors.   Press both shoulders down. Hold _2-3__ seconds. Repeat _3-5__ times. Surface: floor        3. Head Press With [place]  Strengthens neck extensors   Tuck chin SLIGHTLY toward chest, keep mouth closed. Feel weight on back of head. Increase weight by pressing head down. Hold _2-3__ seconds. Relax. Repeat 3-5___ times. Surface: floor   4. Leg Lengthener: stretches quadratus lumborum and hip flexors.  Strengthens quads and ankle dorsiflexors.   Straighten one leg. Pull toes AND forefoot toward knee, extend heel. Lengthen leg by pulling pelvis away from ribs. Hold _2-3__ seconds. Relax. Repeat 1 time. Re-bend knee. Do other leg. Each leg 4-6___ times. Surface: floor  Copyright  VHI. All rights reserved.   5. Leg Press.   Strengthens gluteus maximus, lower erector spinae and ankle dorsiflexors.     Repeat the above motion (Leg lengthener) and press entire leg downward.  Imagine making an impression of the leg in the sand.  Hold 2-3 seconds and relax.  Repeat on the other leg.  Each leg 4-6 times.    Sleeping on Back  Place pillow under knees. A pillow with cervical support and a roll around waist are also helpful. Copyright  VHI. All rights reserved.  Sleeping on Side Place pillow between knees. Use cervical support under neck and a roll around waist as needed. Copyright  VHI. All rights reserved.   Sleeping on Stomach   If this is the only desirable sleeping position, place pillow under lower legs, and under stomach or chest as needed.  Posture - Sitting   Sit upright, head facing forward. Try using a roll to support lower back. Keep shoulders relaxed, and avoid rounded back. Keep hips level with knees. Avoid crossing legs for long periods. Stand to Sit / Sit to Stand   To sit: Bend knees to lower self onto front edge of chair, then scoot back on seat. To stand: Reverse sequence by placing one foot forward, and scoot to front of seat. Use rocking motion to stand up.   Work Height and Reach  Ideal work height is no more than 2 to 4 inches below elbow level when standing, and at elbow level when sitting. Reaching should be limited to arm's length, with elbows slightly bent.  Bending  Bend at hips and knees, not back. Keep feet shoulder-width apart.    Posture - Standing   Good posture is important. Avoid slouching and forward  head thrust. Maintain curve in low back and align ears over shoul- ders, hips over ankles.  Alternating Positions   Alternate tasks and change positions frequently to reduce fatigue and muscle tension. Take rest breaks. Computer Work   Position work to Programmer, multimedia. Use proper work and seat height. Keep shoulders back and down, wrists straight, and elbows at right angles. Use chair that provides full back support. Add footrest and lumbar roll  as needed.  Getting Into / Out of Car  Lower self onto seat, scoot back, then bring in one leg at a time. Reverse sequence to get out.  Dressing  Lie on back to pull socks or slacks over feet, or sit and bend leg while keeping back straight.    Housework - Sink  Place one foot on ledge of cabinet under sink when standing at sink for prolonged periods.   Pushing / Pulling  Pushing is preferable to pulling. Keep back in proper alignment, and use leg muscles to do the work.  Deep Squat   Squat and lift with both arms held against upper trunk. Tighten stomach muscles without holding breath. Use smooth movements to avoid jerking.  Avoid Twisting   Avoid twisting or bending back. Pivot around using foot movements, and bend at knees if needed when reaching for articles.  Carrying Luggage   Distribute weight evenly on both sides. Use a cart whenever possible. Do not twist trunk. Move body as a unit.   Lifting Principles .Maintain proper posture and head alignment. .Slide object as close as possible before lifting. .Move obstacles out of the way. .Test before lifting; ask for help if too heavy. .Tighten stomach muscles without holding breath. .Use smooth movements; do not jerk. .Use legs to do the work, and pivot with feet. .Distribute the work load symmetrically and close to the center of trunk. .Push instead of pull whenever possible.   Ask For Help   Ask for help and delegate to others when possible. Coordinate your movements when lifting together, and maintain the low back curve.  Log Roll   Lying on back, bend left knee and place left arm across chest. Roll all in one movement to the right. Reverse to roll to the left. Always move as one unit. Housework - Sweeping  Use long-handled equipment to avoid stooping.   Housework - Wiping  Position yourself as close as possible to reach work surface. Avoid straining your back.  Laundry - Unloading Wash   To  unload small items at bottom of washer, lift leg opposite to arm being used to reach.  [place] close to area to be raked. Use arm movements to do the work. Keep back straight and avoid twisting.     Cart  When reaching into cart with one arm, lift opposite leg to keep back straight.   Getting Into / Out of Bed  Lower self to lie down on one side by raising legs and lowering head at the same time. Use arms to assist moving without twisting. Bend both knees to roll onto back if desired. To sit up, start from lying on side, and use same move-ments in reverse. Housework - Vacuuming  Hold the vacuum with arm held at side. Step back and forth to move it, keeping head up. Avoid twisting.   Laundry - IT consultant so that bending and twisting can be avoided.   Laundry - Unloading Dryer  Squat down to reach into clothes  dryer or use a reacher.  Gardening - Weeding / Probation officer or Kneel. Knee pads may be helpful.                    Toileting Techniques for Bowel Movements (Defecation)  Using your belly (abdomen) and pelvic floor muscles to have a bowel movement is usually instinctive.  Sometimes people can have problems with these muscles and have to relearn proper defecation (emptying) techniques.  If you have weakness in your muscles, organs that are falling out, decreased sensation in your pelvis, or ignore your urge to go, you may find yourself straining to have a bowel movement.  You are straining if you are:  . holding your breath or taking in a huge gulp of air and holding it  . keeping your lips and jaw tensed and closed tightly . turning red in the face because of excessive pushing or forcing . developing or worsening your  hemorrhoids . getting faint while pushing . not emptying completely and have to defecate many times a day  If you are straining, you are actually making it harder for yourself to have a bowel  movement.  Many people find they are pulling up with the pelvic floor muscles and closing off instead of opening the anus. Due to lack pelvic floor relaxation and coordination the abdominal muscles, one has to work harder to push the feces out.  Many people have never been taught how to defecate efficiently and effectively.  Notice what happens to your body when you are having a bowel movement.  While you are sitting on the toilet pay attention to the following areas: . Jaw and mouth position . Angle of your hips   . Whether your feet touch the ground or not . Arm placement . Spine position . Waist . Belly tension . Anus (opening of the anal canal)  An Evacuation/Defecation Plan   Here are the 4 basic points:  1. Lean forward enough for your elbows to rest on your knees 2. Support your feet on the floor or use a low stool if your feet don't touch the floor  3. Push out your belly as if you have swallowed a beach ball-you should feel a widening of your waist 4. Open and relax your pelvic floor muscles, rather than tightening around the anus   The following conditions my require modifications to your toileting posture:  . If you have had surgery in the past that limits your back, hip, pelvic, knee or ankle flexibility . Constipation   Your healthcare practitioner may make the following additional suggestions and adjustments:  1) Sit on the toilet  a) Make sure your feet are supported. b) Notice your hip angle and spine position-most people find it effective to lean forward or raise their knees, which can help the muscles around the anus to relax  c) When you lean forward, place your forearms on your thighs for support  2) Relax suggestions a) Breath deeply in through your nose and out slowly through your mouth as if you are smelling the flowers and blowing out the candles. b) To become aware of how to relax your muscles, contracting and releasing muscles can be helpful.  Pull your  pelvic floor muscles in tightly by using the image of holding back gas, or closing around the anus (visualize making a circle smaller) and lifting the anus up and in.  Then release the muscles and your anus should drop down and feel  open. Repeat 5 times ending with the feeling of relaxation. c) Keep your pelvic floor muscles relaxed; let your belly bulge out. d) The digestive tract starts at the mouth and ends at the anal opening, so be sure to relax both ends of the tube.  Place your tongue on the roof of your mouth with your teeth separated.  This helps relax your mouth and will help to relax the anus at the same time.  3) Empty (defecation) a) Keep your pelvic floor and sphincter relaxed, then bulge your anal muscles.  Make the anal opening wide.  b) Stick your belly out as if you have swallowed a beach ball. c) Make your belly wall hard using your belly muscles while continuing to breathe. Doing this makes it easier to open your anus. d) Breath out and give a grunt (or try using other sounds such as ahhhh, shhhhh, ohhhh or grrrrrrr).  4) Finish a) As you finish your bowel movement, pull the pelvic floor muscles up and in.  This will leave your anus in the proper place rather than remaining pushed out and down. If you leave your anus pushed out and down, it will start to feel as though that is normal and give you incorrect signals about needing to have a bowel movement.   2007, [employer] Doc.23  Patient able to return demonstration and verbally understand.

## 2014-12-20 NOTE — Therapy (Signed)
[facility] [address], [place], [place], [address] Phone: [phone]   Fax:  [phone]  Physical Therapy Treatment  Patient Details  Name: [name] MRN: [id number] Date of Birth: Jun 16, 1934 Referring Provider:  Delrae Rend, MD  Encounter Date: 12/20/2014      PT End of Session - 12/21/14 0846    Visit Number 2   Number of Visits 16   Date for PT Re-Evaluation 02/05/15   PT Start Time 1400   PT Stop Time 1500   PT Time Calculation (min) 60 min   Activity Tolerance Patient tolerated treatment well   Behavior During Therapy [facility] for tasks assessed/performed      Past Medical History  Diagnosis Date  . Rheumatoid arthritis(714.0)   . Hypothyroidism   . Breast cancer 2008    RT, chemo, lumpectomy  . UTI (lower urinary tract infection)   . Diverticulitis   . Kidney tumor 2010    RFA  . HTN (hypertension)   . Hyperlipidemia   . DVT (deep vein thrombosis) in pregnancy     site of PICC catheter  . Prediabetes   . Abnormal cardiovascular stress test 12/2013    s/p cath with mild nonobstructive CAD normal EF - managed medically    Past Surgical History  Procedure Laterality Date  . Tonsillectomy    . Ovarian cyst removal    . Breast lumpectomy    . Abdominal surgery      for ruptured diverticuli  . Cervical spine surgery    . Bilateral foot surgery    . Renal rfa    . Perforation of colon    . Take down of colonostomy  2011  . Cardiac catheterization    . Left heart catheterization with coronary angiogram N/A 01/17/2014    Procedure: LEFT HEART CATHETERIZATION WITH CORONARY ANGIOGRAM;  Surgeon: Peter M Martinique, MD;  Location: [facility] CATH LAB;  Service: Cardiovascular;  Laterality: N/A;    There were no vitals taken for this visit.  Visit Diagnosis:  Decreased strength of trunk and back      Subjective Assessment - 12/20/14 1403    Symptoms Patient is having low back and shoulder pain.    Pertinent History Patient has cervical surgery 1994 making it difficult to lift. Difficulty with walking due to Rheumatoid arthritis; Breast and kidney cancer   Limitations Lifting;Walking   Patient Stated Goals Relief from pain, less stiff   Currently in Pain? Yes   Pain Score 8    Pain Location Back  shoulders   Pain Orientation Mid   Pain Descriptors / Indicators Burning;Tingling;Pins and needles   Pain Type Chronic pain   Pain Onset More than a month ago   Pain Frequency Constant   Aggravating Factors  pain is worse in the morning, pain with lifitng   Pain Relieving Factors Naproxen, Tylenol 3 , moist heat   Effect of Pain on Daily Activities difficulty wiht waking and lifting   Multiple Pain Sites No                              PT Short Term Goals - 12/20/14 1413    PT SHORT TERM GOAL #1   Title understand ways to manage her osteoporosis with diet   Time 4   Period Weeks   Status Achieved   PT SHORT TERM GOAL #2   Title understand what postures to avoid  to decrease spinal fractures.   Time 4   Period Weeks   Status New  ony initial eval therefore not met   PT SHORT TERM GOAL #3   Title cervical and lumbar pain reduced >/= 25%   Time 4   Period Weeks   Status New  pain level 8/10           PT Long Term Goals - 12/20/14 1414    PT LONG TERM GOAL #1   Title return demostation on correct lifting and perform daily activities with correct posture   Time 8   Period Weeks   Status On-going  starting to learn   PT LONG TERM GOAL #2   Title cervical and lumbar pain decreased >/= 50% due to understanding ways to manage pain   Time 8   Period Weeks   Status New  pain level 8/10   PT LONG TERM GOAL #3   Title understand home exercise program for trunk and extremity strengthening with correct postures   Time 8   Period Weeks   Status On-going  learning exercises               Plan - 12/20/14 1441    Clinical Impression Statement  Patient has a good understanding about body mechanics, how to decompress her spine.    Pt will benefit from skilled therapeutic intervention in order to improve on the following deficits Difficulty walking;Decreased activity tolerance;Decreased mobility;Decreased strength;Pain;Postural dysfunction   Rehab Potential Good   PT Frequency 2x / week  may be 1 time due to schedule   PT Duration 8 weeks   PT Treatment/Interventions Therapeutic activities;Moist Heat;Patient/family education;Therapeutic exercise;Ultrasound;Gait training;Manual techniques;Cryotherapy;Electrical Stimulation   PT Next Visit Plan back strengthening   PT Home Exercise Plan scapular strengthening   Consulted and Agree with Plan of Care Patient        Problem List Patient Active Problem List   Diagnosis Date Noted  . Dyspnea on exertion 12/30/2013  . Chest pain on exertion 12/30/2013  . HTN (hypertension) 12/30/2013  . Hyperlipidemia 12/30/2013  . Rheumatoid arthritis   . Hypothyroidism   . Breast cancer     GRAY,CHERYL, PT 12/21/2014, 8:50 AM  [facility] [address] [place], [place], [address] Phone: [phone]   Fax:  [phone]

## 2014-12-21 ENCOUNTER — Ambulatory Visit: Payer: Medicare Other | Admitting: Physical Therapy

## 2014-12-21 DIAGNOSIS — H04123 Dry eye syndrome of bilateral lacrimal glands: Secondary | ICD-10-CM | POA: Diagnosis not present

## 2014-12-21 DIAGNOSIS — Z961 Presence of intraocular lens: Secondary | ICD-10-CM | POA: Diagnosis not present

## 2014-12-25 ENCOUNTER — Encounter: Payer: Self-pay | Admitting: Physical Therapy

## 2014-12-25 ENCOUNTER — Ambulatory Visit: Payer: Medicare Other | Admitting: Physical Therapy

## 2014-12-25 DIAGNOSIS — R29898 Other symptoms and signs involving the musculoskeletal system: Secondary | ICD-10-CM

## 2014-12-25 DIAGNOSIS — R531 Weakness: Secondary | ICD-10-CM

## 2014-12-25 DIAGNOSIS — E039 Hypothyroidism, unspecified: Secondary | ICD-10-CM | POA: Diagnosis not present

## 2014-12-25 DIAGNOSIS — M6281 Muscle weakness (generalized): Secondary | ICD-10-CM | POA: Diagnosis not present

## 2014-12-25 DIAGNOSIS — M069 Rheumatoid arthritis, unspecified: Secondary | ICD-10-CM | POA: Diagnosis not present

## 2014-12-25 DIAGNOSIS — M810 Age-related osteoporosis without current pathological fracture: Secondary | ICD-10-CM | POA: Diagnosis not present

## 2014-12-25 DIAGNOSIS — I10 Essential (primary) hypertension: Secondary | ICD-10-CM | POA: Diagnosis not present

## 2014-12-25 NOTE — Therapy (Signed)
[facility] [address], [place], [place], [address] Phone: [phone]   Fax:  [phone]  Physical Therapy Treatment  Patient Details  Name: [name] MRN: [id number] Date of Birth: [birth year] Referring Provider:  Delrae Rend, MD  Encounter Date: 12/25/2014      PT End of Session - 12/25/14 1159    Visit Number 3   Number of Visits 10  medicare   Date for PT Re-Evaluation 02/05/15   PT Start Time 0100   PT Stop Time 1230   PT Time Calculation (min) 45 min   Activity Tolerance Patient tolerated treatment well   Behavior During Therapy [facility] for tasks assessed/performed      Past Medical History  Diagnosis Date  . Rheumatoid arthritis(714.0)   . Hypothyroidism   . Breast cancer 2008    RT, chemo, lumpectomy  . UTI (lower urinary tract infection)   . Diverticulitis   . Kidney tumor 2010    RFA  . HTN (hypertension)   . Hyperlipidemia   . DVT (deep vein thrombosis) in pregnancy     site of PICC catheter  . Prediabetes   . Abnormal cardiovascular stress test 12/2013    s/p cath with mild nonobstructive CAD normal EF - managed medically    Past Surgical History  Procedure Laterality Date  . Tonsillectomy    . Ovarian cyst removal    . Breast lumpectomy    . Abdominal surgery      for ruptured diverticuli  . Cervical spine surgery    . Bilateral foot surgery    . Renal rfa    . Perforation of colon    . Take down of colonostomy  2011  . Cardiac catheterization    . Left heart catheterization with coronary angiogram N/A 01/17/2014    Procedure: LEFT HEART CATHETERIZATION WITH CORONARY ANGIOGRAM;  Surgeon: Peter M Martinique, MD;  Location: [facility] CATH LAB;  Service: Cardiovascular;  Laterality: N/A;    There were no vitals filed for this visit.  Visit Diagnosis:  Decreased strength of trunk and back  Decreased strength      Subjective Assessment - 12/25/14 1144    Symptoms The heat and stim  helped my pain.I have been doing the exercises. Patient reports she went walking and her back just ached.    Pertinent History Patient has cervical surgery 1994 making it difficult to lift. Difficulty with walking due to Rheumatoid arthritis; Breast and kidney cancer   Limitations Lifting   How long can you stand comfortably? 30 minutes to cook dinner.   Patient Stated Goals Relief from pain, less stiff   Currently in Pain? Yes   Pain Score 4    Pain Location Neck  back   Pain Orientation Mid   Pain Descriptors / Indicators Nagging;Sharp   Pain Type Chronic pain   Pain Onset More than a month ago   Pain Frequency Intermittent   Aggravating Factors  pain is worse in the morning, pain with lifting   Pain Relieving Factors voltaren cream, Naproxen, moist heat   Effect of Pain on Daily Activities lifting, standing   Multiple Pain Sites No                       [facility] Adult PT Treatment/Exercise - 12/25/14 0001    Modalities   Modalities Electrical Stimulation   Moist Heat Therapy   Number Minutes Moist Heat 20 Minutes   Moist Heat  Location Other (comment)  cervical and back   Electrical Stimulation   Electrical Stimulation Location cervical and back   Electrical Stimulation Action IFC   Electrical Stimulation Parameters 20 min.   Electrical Stimulation Goals Pain                PT Education - 12/25/14 1227    Education provided Yes   Education Details instruction on stretches, posture with walking, postural muscle strengthening   Person(s) Educated Patient   Methods Explanation;Handout   Comprehension Verbalized understanding;Returned demonstration          PT Short Term Goals - 12/25/14 1229    PT SHORT TERM GOAL #1   Title understand ways to manage her osteoporosis with diet   Time 4   Period Weeks   Status Achieved   PT SHORT TERM GOAL #2   Title understand what postures to avoid to decrease spinal fractures.   Time 4   Period Weeks    Status Achieved   PT SHORT TERM GOAL #3   Title cervical and lumbar pain reduced >/= 25%   Time 4   Period Weeks   Status On-going  back improved by 25%           PT Long Term Goals - 12/25/14 1231    PT LONG TERM GOAL #1   Title return demostation on correct lifting and perform daily activities with correct posture   Time 8   Period Weeks   Status Achieved   PT LONG TERM GOAL #2   Title cervical and lumbar pain decreased >/= 50% due to understanding ways to manage pain   Time 8   Period Weeks   Status On-going  back improved by 25%   PT LONG TERM GOAL #3   Title understand home exercise program for trunk and extremity strengthening with correct postures   Time 8   Period Weeks   Status On-going  learning exercises               Plan - 12/25/14 1227    Clinical Impression Statement Patient is learning ways to ambulated with good body mechanics ,learning back strengthening exercises.  Monitoring her cervical pain.    Pt will benefit from skilled therapeutic intervention in order to improve on the following deficits Difficulty walking;Pain;Decreased strength;Decreased mobility;Decreased balance;Decreased activity tolerance;Decreased endurance;Postural dysfunction   Rehab Potential Good   PT Frequency 2x / week   PT Duration 8 weeks   PT Treatment/Interventions Therapeutic activities;Moist Heat;Patient/family education;Therapeutic exercise;Ultrasound;Gait training;Manual techniques;Cryotherapy;Electrical Stimulation        Problem List Patient Active Problem List   Diagnosis Date Noted  . Dyspnea on exertion 12/30/2013  . Chest pain on exertion 12/30/2013  . HTN (hypertension) 12/30/2013  . Hyperlipidemia 12/30/2013  . Rheumatoid arthritis   . Hypothyroidism   . Breast cancer     GRAY,CHERYL, PT 12/25/2014, 12:32 PM  [facility] [address], [place], [place], [address] Phone: [phone]    Fax:  [phone]

## 2014-12-25 NOTE — Patient Instructions (Addendum)
Hamstring Step 1   Straighten left knee. Keep knee level with other knee or on bolster. Hold 30___ seconds. Relax knee by returning foot to start. Repeat _1__ times. Each leg 1 time per day. Heel Cord Stretch   Place one leg forward, bent, other leg behind and straight. Lean forward keeping back heel flat. Hold __30__ seconds while counting out loud. Repeat with other leg. Repeat __1__ times. Do __1__ sessions per day.  http://gt2.exer.us/512   Copyright  VHI. All rights reserved.      Tandem Stance   Right foot in front of left, heel touching toe both feet "straight ahead". Stand on Foot Triangle of Support with both feet. Balance in this position _30__ seconds. Do with left foot in front of right. 2 times each way.  To make harder move arms in different directions.   Copyright  VHI. All rights reserved.  Balance: One-Leg   Stand on left leg on Foot Triangle of Support. Use hand supports. Let go with left hand, then right hand. Support as little as possible but as much as needed. Stand _30__ seconds. Repeat, standing on right leg.  To advance is stand on pillow or move arms up and down.   Copyright  VHI. All rights reserved.  Walk Taller   Pretend that you are 2-4 inches taller than you think or have been told you are. "Attach" a string, rope, golden thread, or steel cable to the top of your head to help pull you up. Pretend that you are a puppet being held up by this string.   Copyright  VHI. All rights reserved.  Backward Walking   Walk backward, toes of each foot coming down first. Take long, even strides. Make sure you have a clear pathway with no obstructions when you do this.   Copyright  VHI. All rights reserved.  Sideward Walking   Walk sideways, both directions. Step out with side of one foot and slide other foot over to meet it.   Copyright  VHI. All rights reserved.  Long Legs   Pretend that your legs start at your waist, IN THE BACK. Use back  buttock and upper thigh muscles to push you forward.   Copyright  VHI. All rights reserved.  Side Pull: Double Arm  No band at first.  Press head into pillow On back, knees bent, feet flat. Arms perpendicular to body, shoulder level, elbows straight but relaxed. Pull arms out to sides, elbows straight. Resistance band comes across collarbones, hands toward floor. Hold momentarily. Slowly return to starting position. Repeat __5_ times. Band color  When gets easier use yellow band._____    Copyright  VHI. All rights reserved.  Arm Press: Single  Press head into pillow.  Arms at sides, palms down. Lift one arm over head to alongside ear, keeping elbow straight. Support under arm. Press arm down. Hold _1__ seconds. Relax arm. Repeat 5 time. Return arm to side, keeping elbow straight. Each arm. 1 time per day.  Copyright  VHI. All rights reserved.  Hip External Rotation: Transverse Plane Stability   One knee bent, one leg straight. Slowly roll bent knee out. Be sure pelvis does not rotate. Do _10__ times. Restabilize pelvis. Repeat with other leg. Do _1__ sets, __1_ times per day.  http://ss.exer.us/22   Copyright  VHI. All rights reserved.  Patient able to perform above exercises and return demonstration with minimal verbal cues.

## 2014-12-27 ENCOUNTER — Ambulatory Visit: Payer: Medicare Other | Admitting: Physical Therapy

## 2014-12-27 ENCOUNTER — Encounter: Payer: Self-pay | Admitting: Physical Therapy

## 2014-12-27 DIAGNOSIS — M546 Pain in thoracic spine: Secondary | ICD-10-CM

## 2014-12-27 DIAGNOSIS — I10 Essential (primary) hypertension: Secondary | ICD-10-CM | POA: Diagnosis not present

## 2014-12-27 DIAGNOSIS — M6281 Muscle weakness (generalized): Secondary | ICD-10-CM | POA: Diagnosis not present

## 2014-12-27 DIAGNOSIS — M069 Rheumatoid arthritis, unspecified: Secondary | ICD-10-CM | POA: Diagnosis not present

## 2014-12-27 DIAGNOSIS — E039 Hypothyroidism, unspecified: Secondary | ICD-10-CM | POA: Diagnosis not present

## 2014-12-27 DIAGNOSIS — R29898 Other symptoms and signs involving the musculoskeletal system: Secondary | ICD-10-CM | POA: Diagnosis not present

## 2014-12-27 DIAGNOSIS — M810 Age-related osteoporosis without current pathological fracture: Secondary | ICD-10-CM | POA: Diagnosis not present

## 2014-12-27 NOTE — Patient Instructions (Signed)
Patient will cut exercise reps in half and spread them out thoughout the day, possibly spreading out for two days.

## 2014-12-27 NOTE — Therapy (Signed)
[facility] [address], [place], [place], [address] Phone: [phone]   Fax:  [phone]  Physical Therapy Treatment  Patient Details  Name: [name] MRN: [id number] Date of Birth: 05-23-1934 Referring Provider:  Delrae Rend, MD  Encounter Date: 12/27/2014      PT End of Session - 12/27/14 1215    Visit Number 4   Number of Visits 10   Date for PT Re-Evaluation 02/05/15   PT Start Time 1448   PT Stop Time 1230   PT Time Calculation (min) 45 min   Activity Tolerance Patient limited by pain      Past Medical History  Diagnosis Date  . Rheumatoid arthritis(714.0)   . Hypothyroidism   . Breast cancer 2008    RT, chemo, lumpectomy  . UTI (lower urinary tract infection)   . Diverticulitis   . Kidney tumor 2010    RFA  . HTN (hypertension)   . Hyperlipidemia   . DVT (deep vein thrombosis) in pregnancy     site of PICC catheter  . Prediabetes   . Abnormal cardiovascular stress test 12/2013    s/p cath with mild nonobstructive CAD normal EF - managed medically    Past Surgical History  Procedure Laterality Date  . Tonsillectomy    . Ovarian cyst removal    . Breast lumpectomy    . Abdominal surgery      for ruptured diverticuli  . Cervical spine surgery    . Bilateral foot surgery    . Renal rfa    . Perforation of colon    . Take down of colonostomy  2011  . Cardiac catheterization    . Left heart catheterization with coronary angiogram N/A 01/17/2014    Procedure: LEFT HEART CATHETERIZATION WITH CORONARY ANGIOGRAM;  Surgeon: Peter M Martinique, MD;  Location: [facility] CATH LAB;  Service: Cardiovascular;  Laterality: N/A;    There were no vitals filed for this visit.  Visit Diagnosis:  Bilateral thoracic back pain      Subjective Assessment - 12/27/14 1151    Symptoms Had horrible pain after last session: she feels too much exercise in general. Better today but still in pain.    Currently in  Pain? Yes   Pain Score 8    Pain Location Back   Pain Orientation Mid;Lower   Pain Descriptors / Indicators Aching;Discomfort;Sore   Pain Type Chronic pain   Aggravating Factors  Over doing exercise.    Pain Relieving Factors Meds, rest   Multiple Pain Sites No                       [facility] Adult PT Treatment/Exercise - 12/27/14 0001    Lumbar Exercises: Supine   Other Supine Lumbar Exercises decompression in hooklying with breatthing x 49min   Other Supine Lumbar Exercises Shoulder press 3x 2 sec, Leg lengthener bil 3x 3 sec   Moist Heat Therapy   Number Minutes Moist Heat 20 Minutes   Moist Heat Location --  Mid to low back   Electrical Stimulation   Electrical Stimulation Location Mid to low back   Electrical Stimulation Action IFC   Electrical Stimulation Parameters 20 min   Electrical Stimulation Goals Pain                PT Education - 12/27/14 1215    Education provided Yes   Education Details Modifying exercise parameters   Person(s) Educated Patient  Methods Explanation   Comprehension Verbalized understanding          PT Short Term Goals - 12/25/14 1229    PT SHORT TERM GOAL #1   Title understand ways to manage her osteoporosis with diet   Time 4   Period Weeks   Status Achieved   PT SHORT TERM GOAL #2   Title understand what postures to avoid to decrease spinal fractures.   Time 4   Period Weeks   Status Achieved   PT SHORT TERM GOAL #3   Title cervical and lumbar pain reduced >/= 25%   Time 4   Period Weeks   Status On-going  back improved by 25%           PT Long Term Goals - 12/25/14 1231    PT LONG TERM GOAL #1   Title return demostation on correct lifting and perform daily activities with correct posture   Time 8   Period Weeks   Status Achieved   PT LONG TERM GOAL #2   Title cervical and lumbar pain decreased >/= 50% due to understanding ways to manage pain   Time 8   Period Weeks   Status On-going  back  improved by 25%   PT LONG TERM GOAL #3   Title understand home exercise program for trunk and extremity strengthening with correct postures   Time 8   Period Weeks   Status On-going  learning exercises               Plan - 12/27/14 1217    Clinical Impression Statement Held most exercising today due to pain levels and patients request. We modified the parameters of her HEP to lessen volume overall.    Pt will benefit from skilled therapeutic intervention in order to improve on the following deficits Difficulty walking;Pain;Decreased strength;Decreased mobility;Decreased balance;Decreased activity tolerance;Decreased endurance;Postural dysfunction   Rehab Potential Good   PT Frequency 2x / week   PT Duration 8 weeks   PT Treatment/Interventions Therapeutic activities;Moist Heat;Patient/family education;Therapeutic exercise;Ultrasound;Gait training;Manual techniques;Cryotherapy;Electrical Stimulation   PT Next Visit Plan Assess pain, resume exercises if better.   Consulted and Agree with Plan of Care Patient        Problem List Patient Active Problem List   Diagnosis Date Noted  . Dyspnea on exertion 12/30/2013  . Chest pain on exertion 12/30/2013  . HTN (hypertension) 12/30/2013  . Hyperlipidemia 12/30/2013  . Rheumatoid arthritis   . Hypothyroidism   . Breast cancer     Markia Kyer, PTA 12/27/2014, 12:20 PM  [facility] [address] [place], [place], [address] Phone: [phone]   Fax:  [phone]

## 2015-01-01 ENCOUNTER — Encounter: Payer: PRIVATE HEALTH INSURANCE | Admitting: Physical Therapy

## 2015-01-02 ENCOUNTER — Encounter: Payer: Self-pay | Admitting: Physical Therapy

## 2015-01-02 ENCOUNTER — Ambulatory Visit: Payer: Medicare Other | Admitting: Physical Therapy

## 2015-01-02 DIAGNOSIS — R29898 Other symptoms and signs involving the musculoskeletal system: Secondary | ICD-10-CM | POA: Diagnosis not present

## 2015-01-02 DIAGNOSIS — M069 Rheumatoid arthritis, unspecified: Secondary | ICD-10-CM | POA: Diagnosis not present

## 2015-01-02 DIAGNOSIS — E039 Hypothyroidism, unspecified: Secondary | ICD-10-CM | POA: Diagnosis not present

## 2015-01-02 DIAGNOSIS — M810 Age-related osteoporosis without current pathological fracture: Secondary | ICD-10-CM | POA: Diagnosis not present

## 2015-01-02 DIAGNOSIS — I10 Essential (primary) hypertension: Secondary | ICD-10-CM | POA: Diagnosis not present

## 2015-01-02 DIAGNOSIS — M6281 Muscle weakness (generalized): Secondary | ICD-10-CM | POA: Diagnosis not present

## 2015-01-02 DIAGNOSIS — M546 Pain in thoracic spine: Secondary | ICD-10-CM

## 2015-01-02 NOTE — Therapy (Signed)
[facility] [address], [place], [place], [address] Phone: [phone]   Fax:  [phone]  Physical Therapy Treatment  Patient Details  Name: [name] MRN: [id number] Date of Birth: [birth year] Referring Provider:  Delrae Rend, MD  Encounter Date: 01/02/2015      PT End of Session - 01/02/15 1014    Visit Number 5   Number of Visits 10  Medicare   Date for PT Re-Evaluation 02/05/15   PT Start Time 0930   PT Stop Time 1040   PT Time Calculation (min) 70 min   Activity Tolerance Patient limited by pain   Behavior During Therapy [facility] for tasks assessed/performed      Past Medical History  Diagnosis Date  . Rheumatoid arthritis(714.0)   . Hypothyroidism   . Breast cancer 2008    RT, chemo, lumpectomy  . UTI (lower urinary tract infection)   . Diverticulitis   . Kidney tumor 2010    RFA  . HTN (hypertension)   . Hyperlipidemia   . DVT (deep vein thrombosis) in pregnancy     site of PICC catheter  . Prediabetes   . Abnormal cardiovascular stress test 12/2013    s/p cath with mild nonobstructive CAD normal EF - managed medically    Past Surgical History  Procedure Laterality Date  . Tonsillectomy    . Ovarian cyst removal    . Breast lumpectomy    . Abdominal surgery      for ruptured diverticuli  . Cervical spine surgery    . Bilateral foot surgery    . Renal rfa    . Perforation of colon    . Take down of colonostomy  2011  . Cardiac catheterization    . Left heart catheterization with coronary angiogram N/A 01/17/2014    Procedure: LEFT HEART CATHETERIZATION WITH CORONARY ANGIOGRAM;  Surgeon: Peter M Martinique, MD;  Location: [facility] CATH LAB;  Service: Cardiovascular;  Laterality: N/A;    There were no vitals filed for this visit.  Visit Diagnosis:  Bilateral thoracic back pain  Decreased strength of trunk and back      Subjective Assessment - 01/02/15 0938    Symptoms I feel better but I  have tingling in my back. I only doing half of my exercises. Laying on the floor to exercise helps.    Pertinent History Patient has cervical surgery 1994 making it difficult to lift. Difficulty with walking due to Rheumatoid arthritis; Breast and kidney cancer   Limitations Lifting   How long can you stand comfortably? 30 minutes to cook dinner.   Patient Stated Goals Relief from pain, less stiff   Currently in Pain? Yes   Pain Score 6    Pain Location Back  shoulders, neck   Pain Orientation Mid;Lower   Pain Descriptors / Indicators Aching;Discomfort;Sore   Pain Type Chronic pain   Pain Onset More than a month ago   Pain Frequency Constant  constant low grade pain   Aggravating Factors  too much exercise   Pain Relieving Factors meds, laying with pillow between knees   Effect of Pain on Daily Activities lifting and standing   Multiple Pain Sites No      Patient requires low level exercises with limited repetitions due to her pain.                  [facility] Adult PT Treatment/Exercise - 01/02/15 0001    Posture/Postural Control   Posture  Comments sit with pillow on lap to decrease slouching   Lumbar Exercises: Supine   Ab Set 5 reps;5 seconds   AB Set Limitations press hands into ball bil. with abdominal contraction   Glut Set 5 reps   Glut Set Limitations theraband with moving knees outward in hookly 5 hold 5 sec   Clam 5 reps;Other (comment)   Clam Limitations yellow band hold 5 sec.    Bridge 5 reps   Bridge Limitations ball squeeze, lift hips with gluteal contraction    Other Supine Lumbar Exercises decompression in hooklying with breatthing x 60min   Other Supine Lumbar Exercises hookly ball squeeze hold 5 sec.    Moist Heat Therapy   Number Minutes Moist Heat 20 Minutes   Moist Heat Location --  Mid to low back   Electrical Stimulation   Electrical Stimulation Location cervical and back   Electrical Stimulation Action IFC   Electrical Stimulation Parameters  20   Electrical Stimulation Goals Pain                PT Education - 01/02/15 1014    Education provided Yes   Education Details body mechanics with reaching, bed position, and rolling in bed   Person(s) Educated Patient   Methods Explanation;Verbal cues   Comprehension Verbalized understanding          PT Short Term Goals - 01/02/15 0941    PT SHORT TERM GOAL #1   Title understand ways to manage her osteoporosis with diet   Time 4   Period Weeks   Status Achieved   PT SHORT TERM GOAL #2   Title understand what postures to avoid to decrease spinal fractures.   Time 4   Period Weeks   Status Achieved   PT SHORT TERM GOAL #3   Time 4   Period Weeks   Status On-going  no change in pain due to flare-up           PT Long Term Goals - 01/02/15 0942    PT LONG TERM GOAL #1   Title return demostation on correct lifting and perform daily activities with correct posture   Time 8   Period Weeks   Status Achieved   PT LONG TERM GOAL #2   Title cervical and lumbar pain decreased >/= 50% due to understanding ways to manage pain   Time 8   Period Weeks   Status On-going  no change since flare-up   PT LONG TERM GOAL #3   Time 8   Period Weeks   Status On-going  still learning exercises that will not aggravate her spine               Plan - 01/02/15 0958    Clinical Impression Statement Patient had many rests with exercise so she does not overexert herself and have increased pain. Patient is learning low level trunk strengthening with monitoring for technique and pain.    Pt will benefit from skilled therapeutic intervention in order to improve on the following deficits Difficulty walking;Pain;Decreased strength;Decreased mobility;Decreased balance;Decreased activity tolerance;Decreased endurance;Postural dysfunction   Rehab Potential Good   Clinical Impairments Affecting Rehab Potential None   PT Frequency 2x / week   PT Duration 8 weeks   PT  Treatment/Interventions Therapeutic activities;Moist Heat;Patient/family education;Therapeutic exercise;Ultrasound;Gait training;Manual techniques;Cryotherapy;Electrical Stimulation   PT Next Visit Plan low level exercises   PT Home Exercise Plan review HEP   Consulted and Agree with Plan of Care Patient  Problem List Patient Active Problem List   Diagnosis Date Noted  . Dyspnea on exertion 12/30/2013  . Chest pain on exertion 12/30/2013  . HTN (hypertension) 12/30/2013  . Hyperlipidemia 12/30/2013  . Rheumatoid arthritis   . Hypothyroidism   . Breast cancer     Jax Kentner,PT 01/02/2015, 10:19 AM  [facility] [address] [place], [place], [address] Phone: [phone]   Fax:  [phone]

## 2015-01-02 NOTE — Patient Instructions (Signed)
Physical therapist instructed patient to limit overhead arm movements, roll in bed to keep spinal neutral, sleeping with pillows between knees and feet, be close to object she is picking up with a reacher.  Patient verbally understands.

## 2015-01-03 ENCOUNTER — Ambulatory Visit: Payer: Medicare Other | Admitting: Physical Therapy

## 2015-01-03 ENCOUNTER — Encounter: Payer: Self-pay | Admitting: Physical Therapy

## 2015-01-03 DIAGNOSIS — M069 Rheumatoid arthritis, unspecified: Secondary | ICD-10-CM | POA: Diagnosis not present

## 2015-01-03 DIAGNOSIS — M6281 Muscle weakness (generalized): Secondary | ICD-10-CM | POA: Diagnosis not present

## 2015-01-03 DIAGNOSIS — R29898 Other symptoms and signs involving the musculoskeletal system: Secondary | ICD-10-CM

## 2015-01-03 DIAGNOSIS — I10 Essential (primary) hypertension: Secondary | ICD-10-CM | POA: Diagnosis not present

## 2015-01-03 DIAGNOSIS — E039 Hypothyroidism, unspecified: Secondary | ICD-10-CM | POA: Diagnosis not present

## 2015-01-03 DIAGNOSIS — M810 Age-related osteoporosis without current pathological fracture: Secondary | ICD-10-CM | POA: Diagnosis not present

## 2015-01-03 DIAGNOSIS — R531 Weakness: Secondary | ICD-10-CM

## 2015-01-03 NOTE — Therapy (Signed)
[facility] [address] [place], [place], [address] Phone: [phone]   Fax:  [phone]  Physical Therapy Treatment  Patient Details  Name: [name] MRN: [id number] Date of Birth: 1933-12-14 Referring Provider:  Delrae Rend, MD  Encounter Date: 01/03/2015      PT End of Session - 01/03/15 1218    Visit Number 6   Number of Visits 10   Date for PT Re-Evaluation 02/05/15   PT Start Time 0258   PT Stop Time 1230   PT Time Calculation (min) 45 min   Activity Tolerance Patient limited by pain   Behavior During Therapy [facility] for tasks assessed/performed      Past Medical History  Diagnosis Date  . Rheumatoid arthritis(714.0)   . Hypothyroidism   . Breast cancer 2008    RT, chemo, lumpectomy  . UTI (lower urinary tract infection)   . Diverticulitis   . Kidney tumor 2010    RFA  . HTN (hypertension)   . Hyperlipidemia   . DVT (deep vein thrombosis) in pregnancy     site of PICC catheter  . Prediabetes   . Abnormal cardiovascular stress test 12/2013    s/p cath with mild nonobstructive CAD normal EF - managed medically    Past Surgical History  Procedure Laterality Date  . Tonsillectomy    . Ovarian cyst removal    . Breast lumpectomy    . Abdominal surgery      for ruptured diverticuli  . Cervical spine surgery    . Bilateral foot surgery    . Renal rfa    . Perforation of colon    . Take down of colonostomy  2011  . Cardiac catheterization    . Left heart catheterization with coronary angiogram N/A 01/17/2014    Procedure: LEFT HEART CATHETERIZATION WITH CORONARY ANGIOGRAM;  Surgeon: Peter M Martinique, MD;  Location: [facility] CATH LAB;  Service: Cardiovascular;  Laterality: N/A;    There were no vitals filed for this visit.  Visit Diagnosis:  Decreased strength of trunk and back  Decreased strength      Subjective Assessment - 01/03/15 1147    Symptoms Cutting exercises back has been very  helpful. Slept like a "baby" last night.    Currently in Pain? Yes   Pain Score 7    Pain Location Back   Pain Orientation Left;Lower   Pain Descriptors / Indicators Aching;Discomfort   Pain Type Chronic pain   Aggravating Factors  Too much activity   Pain Relieving Factors Laying, meds   Multiple Pain Sites No                       [facility] Adult PT Treatment/Exercise - 01/03/15 0001    Lumbar Exercises: Supine   Ab Set 5 reps;5 seconds   AB Set Limitations press hands into ball bil. with abdominal contraction   Glut Set 5 reps   Glut Set Limitations theraband with moving knees outward in hookly 5 hold 5 sec   Clam 5 reps;Other (comment)   Clam Limitations yellow band hold 5 sec.    Bridge 5 reps   Bridge Limitations ball squeeze, lift hips with gluteal contraction    Moist Heat Therapy   Number Minutes Moist Heat 15 Minutes   Moist Heat Location --  Mid to low back   Electrical Stimulation   Electrical Stimulation Location Upper to low lumbar   Electrical Stimulation Action IFC  Electrical Stimulation Parameters 15   Electrical Stimulation Goals Pain                PT Education - 01/02/15 1014    Education provided Yes   Education Details body mechanics with reaching, bed position, and rolling in bed   Person(s) Educated Patient   Methods Explanation;Verbal cues   Comprehension Verbalized understanding          PT Short Term Goals - 01/02/15 0941    PT SHORT TERM GOAL #1   Title understand ways to manage her osteoporosis with diet   Time 4   Period Weeks   Status Achieved   PT SHORT TERM GOAL #2   Title understand what postures to avoid to decrease spinal fractures.   Time 4   Period Weeks   Status Achieved   PT SHORT TERM GOAL #3   Time 4   Period Weeks   Status On-going  no change in pain due to flare-up           PT Long Term Goals - 01/02/15 0942    PT LONG TERM GOAL #1   Title return demostation on correct lifting and  perform daily activities with correct posture   Time 8   Period Weeks   Status Achieved   PT LONG TERM GOAL #2   Title cervical and lumbar pain decreased >/= 50% due to understanding ways to manage pain   Time 8   Period Weeks   Status On-going  no change since flare-up   PT LONG TERM GOAL #3   Time 8   Period Weeks   Status On-going  still learning exercises that will not aggravate her spine               Plan - 01/03/15 1219    Clinical Impression Statement Patient continues to do better with low load exercises.    Pt will benefit from skilled therapeutic intervention in order to improve on the following deficits Difficulty walking;Pain;Decreased strength;Decreased mobility;Decreased balance;Decreased activity tolerance;Decreased endurance;Postural dysfunction   Rehab Potential Good   Clinical Impairments Affecting Rehab Potential None   PT Duration 8 weeks   PT Treatment/Interventions Therapeutic activities;Moist Heat;Patient/family education;Therapeutic exercise;Ultrasound;Gait training;Manual techniques;Cryotherapy;Electrical Stimulation   PT Next Visit Plan low level exercises   Consulted and Agree with Plan of Care Patient        Problem List Patient Active Problem List   Diagnosis Date Noted  . Dyspnea on exertion 12/30/2013  . Chest pain on exertion 12/30/2013  . HTN (hypertension) 12/30/2013  . Hyperlipidemia 12/30/2013  . Rheumatoid arthritis   . Hypothyroidism   . Breast cancer     Cathy Crounse, PTA 01/03/2015, 12:21 PM  [facility] [address], [place], [place], [address] Phone: [phone]   Fax:  [phone]

## 2015-01-04 DIAGNOSIS — M810 Age-related osteoporosis without current pathological fracture: Secondary | ICD-10-CM | POA: Diagnosis not present

## 2015-01-08 ENCOUNTER — Ambulatory Visit: Payer: Medicare Other | Admitting: Physical Therapy

## 2015-01-10 ENCOUNTER — Ambulatory Visit: Payer: Medicare Other | Admitting: Physical Therapy

## 2015-01-10 ENCOUNTER — Encounter: Payer: Self-pay | Admitting: Physical Therapy

## 2015-01-10 DIAGNOSIS — R29898 Other symptoms and signs involving the musculoskeletal system: Secondary | ICD-10-CM | POA: Diagnosis not present

## 2015-01-10 DIAGNOSIS — I10 Essential (primary) hypertension: Secondary | ICD-10-CM | POA: Diagnosis not present

## 2015-01-10 DIAGNOSIS — M6281 Muscle weakness (generalized): Secondary | ICD-10-CM | POA: Diagnosis not present

## 2015-01-10 DIAGNOSIS — E039 Hypothyroidism, unspecified: Secondary | ICD-10-CM | POA: Diagnosis not present

## 2015-01-10 DIAGNOSIS — M546 Pain in thoracic spine: Secondary | ICD-10-CM

## 2015-01-10 DIAGNOSIS — M069 Rheumatoid arthritis, unspecified: Secondary | ICD-10-CM | POA: Diagnosis not present

## 2015-01-10 DIAGNOSIS — M810 Age-related osteoporosis without current pathological fracture: Secondary | ICD-10-CM | POA: Diagnosis not present

## 2015-01-10 NOTE — Therapy (Signed)
[facility] [address], [place], [place], [address] Phone: [phone]   Fax:  [phone]  Physical Therapy Treatment  Patient Details  Name: [name] MRN: [id number] Date of Birth: 1933-10-16 Referring Provider:  Delrae Rend, MD  Encounter Date: 01/10/2015      PT End of Session - 01/10/15 1223    Visit Number 7   Number of Visits 10   Date for PT Re-Evaluation 02/05/15   PT Start Time 1146   PT Stop Time 1240   PT Time Calculation (min) 54 min   Activity Tolerance Patient tolerated treatment well   Behavior During Therapy [facility] for tasks assessed/performed      Past Medical History  Diagnosis Date  . Rheumatoid arthritis(714.0)   . Hypothyroidism   . Breast cancer 2008    RT, chemo, lumpectomy  . UTI (lower urinary tract infection)   . Diverticulitis   . Kidney tumor 2010    RFA  . HTN (hypertension)   . Hyperlipidemia   . DVT (deep vein thrombosis) in pregnancy     site of PICC catheter  . Prediabetes   . Abnormal cardiovascular stress test 12/2013    s/p cath with mild nonobstructive CAD normal EF - managed medically    Past Surgical History  Procedure Laterality Date  . Tonsillectomy    . Ovarian cyst removal    . Breast lumpectomy    . Abdominal surgery      for ruptured diverticuli  . Cervical spine surgery    . Bilateral foot surgery    . Renal rfa    . Perforation of colon    . Take down of colonostomy  2011  . Cardiac catheterization    . Left heart catheterization with coronary angiogram N/A 01/17/2014    Procedure: LEFT HEART CATHETERIZATION WITH CORONARY ANGIOGRAM;  Surgeon: Peter M Martinique, MD;  Location: [facility] CATH LAB;  Service: Cardiovascular;  Laterality: N/A;    There were no vitals filed for this visit.  Visit Diagnosis:  Decreased strength of trunk and back  Bilateral thoracic back pain      Subjective Assessment - 01/10/15 1151    Symptoms Been hurting bc I  have been doing too much: raking, sweeping etc..missed appt on Monday had wrong time.    Currently in Pain? Yes   Pain Score 5    Pain Location Back   Pain Orientation Left;Lower   Pain Descriptors / Indicators Aching   Pain Type Chronic pain   Aggravating Factors  Mini bending activities   Pain Relieving Factors Laying flat   Multiple Pain Sites No                       [facility] Adult PT Treatment/Exercise - 01/10/15 0001    Lumbar Exercises: Standing   Other Standing Lumbar Exercises weight shift on mini tramp 10 x eacg direction   Lumbar Exercises: Supine   Ab Set --  8 reps holding 3 seconds   AB Set Limitations press hands into ball bil. with abdominal contraction   Glut Set --  8 x 3 second holds   Clam Limitations yellow band hold 5 sec.    Bridge --  8 reps x 3 seconds   Bridge Limitations ball squeeze, lift hips with gluteal contraction    Other Supine Lumbar Exercises Yellow band hip abd 5 x   Moist Heat Therapy   Number Minutes Moist Heat 15  Minutes   Moist Heat Location --  Mid to low back   Electrical Stimulation   Electrical Stimulation Location Upper to low lumbar   Electrical Stimulation Action IFC   Electrical Stimulation Parameters 15   Electrical Stimulation Goals Pain                  PT Short Term Goals - 01/10/15 1225    PT SHORT TERM GOAL #1   Title understand ways to manage her osteoporosis with diet   Time 4   Period Weeks   Status Achieved   PT SHORT TERM GOAL #2   Title understand what postures to avoid to decrease spinal fractures.   Time 4   Period Weeks   Status Achieved   PT SHORT TERM GOAL #3   Title cervical and lumbar pain reduced >/= 25%   Time 4   Period Weeks   Status On-going  Just depends on what she is doing she reports.            PT Long Term Goals - 01/10/15 1226    PT LONG TERM GOAL #1   Title return demostation on correct lifting and perform daily activities with correct posture   Time 8    Period Weeks   Status Achieved   PT LONG TERM GOAL #2   Title cervical and lumbar pain decreased >/= 50% due to understanding ways to manage pain   Time 8   Period Weeks   Status On-going   PT LONG TERM GOAL #3   Title understand home exercise program for trunk and extremity strengthening with correct postures   Time 8   Period Weeks   Status On-going               Plan - 01/10/15 1224    Clinical Impression Statement Patient forgets to stand erect often when performing her ADLS. Continues to do well with load load exercises.   Pt will benefit from skilled therapeutic intervention in order to improve on the following deficits Difficulty walking;Pain;Decreased strength;Decreased mobility;Decreased balance;Decreased activity tolerance;Decreased endurance;Postural dysfunction   Rehab Potential Good   Clinical Impairments Affecting Rehab Potential None   PT Frequency 2x / week   PT Duration 8 weeks   PT Treatment/Interventions Therapeutic activities;Moist Heat;Patient/family education;Therapeutic exercise;Ultrasound;Gait training;Manual techniques;Cryotherapy;Electrical Stimulation   PT Next Visit Plan low level exercises   Consulted and Agree with Plan of Care Patient        Problem List Patient Active Problem List   Diagnosis Date Noted  . Dyspnea on exertion 12/30/2013  . Chest pain on exertion 12/30/2013  . HTN (hypertension) 12/30/2013  . Hyperlipidemia 12/30/2013  . Rheumatoid arthritis   . Hypothyroidism   . Breast cancer     Deannah Rossi, PTA 01/10/2015, 12:27 PM  [facility] [address] [place], [place], [address] Phone: [phone]   Fax:  [phone]

## 2015-01-11 ENCOUNTER — Ambulatory Visit: Payer: Medicare Other | Admitting: Podiatrist

## 2015-01-15 ENCOUNTER — Ambulatory Visit: Payer: Medicare Other | Attending: Internal Medicine | Admitting: Physical Therapy

## 2015-01-15 ENCOUNTER — Encounter: Payer: Self-pay | Admitting: Physical Therapy

## 2015-01-15 DIAGNOSIS — M810 Age-related osteoporosis without current pathological fracture: Secondary | ICD-10-CM | POA: Insufficient documentation

## 2015-01-15 DIAGNOSIS — R29898 Other symptoms and signs involving the musculoskeletal system: Secondary | ICD-10-CM | POA: Insufficient documentation

## 2015-01-15 DIAGNOSIS — Z86718 Personal history of other venous thrombosis and embolism: Secondary | ICD-10-CM | POA: Diagnosis not present

## 2015-01-15 DIAGNOSIS — M069 Rheumatoid arthritis, unspecified: Secondary | ICD-10-CM | POA: Insufficient documentation

## 2015-01-15 DIAGNOSIS — R531 Weakness: Secondary | ICD-10-CM

## 2015-01-15 DIAGNOSIS — Z853 Personal history of malignant neoplasm of breast: Secondary | ICD-10-CM | POA: Insufficient documentation

## 2015-01-15 DIAGNOSIS — E785 Hyperlipidemia, unspecified: Secondary | ICD-10-CM | POA: Insufficient documentation

## 2015-01-15 DIAGNOSIS — I10 Essential (primary) hypertension: Secondary | ICD-10-CM | POA: Insufficient documentation

## 2015-01-15 DIAGNOSIS — E039 Hypothyroidism, unspecified: Secondary | ICD-10-CM | POA: Insufficient documentation

## 2015-01-15 DIAGNOSIS — M6281 Muscle weakness (generalized): Secondary | ICD-10-CM | POA: Insufficient documentation

## 2015-01-15 NOTE — Therapy (Signed)
[facility] [address] [place], [place], [address] Phone: [phone]   Fax:  [phone]  Physical Therapy Treatment  Patient Details  Name: [name] MRN: [id number] Date of Birth: May 18, 1934 Referring Provider:  Delrae Rend, MD  Encounter Date: 01/15/2015      PT End of Session - 01/15/15 1208    Visit Number 8   Number of Visits 10   Date for PT Re-Evaluation 02/05/15   PT Start Time 6237   PT Stop Time 1240   PT Time Calculation (min) 55 min   Activity Tolerance Patient tolerated treatment well   Behavior During Therapy [facility] for tasks assessed/performed      Past Medical History  Diagnosis Date  . Rheumatoid arthritis(714.0)   . Hypothyroidism   . Breast cancer 2008    RT, chemo, lumpectomy  . UTI (lower urinary tract infection)   . Diverticulitis   . Kidney tumor 2010    RFA  . HTN (hypertension)   . Hyperlipidemia   . DVT (deep vein thrombosis) in pregnancy     site of PICC catheter  . Prediabetes   . Abnormal cardiovascular stress test 12/2013    s/p cath with mild nonobstructive CAD normal EF - managed medically    Past Surgical History  Procedure Laterality Date  . Tonsillectomy    . Ovarian cyst removal    . Breast lumpectomy    . Abdominal surgery      for ruptured diverticuli  . Cervical spine surgery    . Bilateral foot surgery    . Renal rfa    . Perforation of colon    . Take down of colonostomy  2011  . Cardiac catheterization    . Left heart catheterization with coronary angiogram N/A 01/17/2014    Procedure: LEFT HEART CATHETERIZATION WITH CORONARY ANGIOGRAM;  Surgeon: Peter M Martinique, MD;  Location: [facility] CATH LAB;  Service: Cardiovascular;  Laterality: N/A;    There were no vitals filed for this visit.  Visit Diagnosis:  Decreased strength of trunk and back  Decreased strength      Subjective Assessment - 01/15/15 1157    Subjective Patient reports the pain does  come back.  I have pins and needles between my shoulder blades.  I have been pulling dead grass and planting flowers.    Pertinent History Patient has cervical surgery 1994 making it difficult to lift. Difficulty with walking due to Rheumatoid arthritis; Breast and kidney cancer   Limitations Lifting   How long can you stand comfortably? 40 minutes   Patient Stated Goals Relief from pain, less stiff            [facility] PT Assessment - 01/15/15 0001    Posture/Postural Control   Posture Comments 60.75 inches                   [facility] Adult PT Treatment/Exercise - 01/15/15 0001    Lumbar Exercises: Standing   Other Standing Lumbar Exercises weight shift on mini tramp 10 x eacg direction   Other Standing Lumbar Exercises stand press head into wall  with ball hold 3 sec 5x2   Lumbar Exercises: Supine   Ab Set --  8 reps holding 3 seconds   AB Set Limitations press hands at side into ball bil. with abdominal contraction   Clam 5 reps;Other (comment)  press hands at side into ball   Clam Limitations yellow band hold 5 sec.  Bridge --  8 reps x 3 seconds   Bridge Limitations ball squeeze, lift hips with gluteal contraction    Other Supine Lumbar Exercises hookly squeeze ball between hands with bil. shoulder flexion 5x2   Moist Heat Therapy   Number Minutes Moist Heat 20 Minutes   Moist Heat Location --  Mid to low back   Electrical Stimulation   Electrical Stimulation Location Upper to low lumbar   Electrical Stimulation Action IFC   Electrical Stimulation Parameters 20   Electrical Stimulation Goals Pain      Patient able to tolerate more exercises without increase of pain.             PT Short Term Goals - 01/15/15 1223    PT SHORT TERM GOAL #3   Title cervical and lumbar pain reduced >/= 25%   Time 4   Period Weeks   Status On-going  10% better           PT Long Term Goals - 01/15/15 1223    PT LONG TERM GOAL #2   Title cervical and lumbar pain  decreased >/= 50% due to understanding ways to manage pain   Time 8   Period Weeks   Status On-going  10% better   PT LONG TERM GOAL #3   Title understand home exercise program for trunk and extremity strengthening with correct postures   Time 8   Period Weeks   Status On-going  still learning exercises she can tolerate               Plan - 01/15/15 1200    Clinical Impression Statement Patient can stand 10 minutes longer. Patient has grown 1/4 of an inch. Patient can turn her head better to look behind her. Patient pain is decreased by 10%.    Pt will benefit from skilled therapeutic intervention in order to improve on the following deficits Difficulty walking;Pain;Decreased strength;Decreased mobility;Decreased balance;Decreased activity tolerance;Decreased endurance;Postural dysfunction   Rehab Potential Good   Clinical Impairments Affecting Rehab Potential None   PT Frequency 2x / week   PT Duration 8 weeks   PT Treatment/Interventions Therapeutic activities;Moist Heat;Patient/family education;Therapeutic exercise;Ultrasound;Gait training;Manual techniques;Cryotherapy;Electrical Stimulation   PT Next Visit Plan low level exercises   PT Home Exercise Plan continue with curren HEP   Consulted and Agree with Plan of Care Patient        Problem List Patient Active Problem List   Diagnosis Date Noted  . Dyspnea on exertion 12/30/2013  . Chest pain on exertion 12/30/2013  . HTN (hypertension) 12/30/2013  . Hyperlipidemia 12/30/2013  . Rheumatoid arthritis   . Hypothyroidism   . Breast cancer     Ivo Moga,PT 01/15/2015, 12:26 PM  [facility] [address] [place], [place], [address] Phone: [phone]   Fax:  [phone]

## 2015-01-16 DIAGNOSIS — M0579 Rheumatoid arthritis with rheumatoid factor of multiple sites without organ or systems involvement: Secondary | ICD-10-CM | POA: Diagnosis not present

## 2015-01-16 DIAGNOSIS — Z79899 Other long term (current) drug therapy: Secondary | ICD-10-CM | POA: Diagnosis not present

## 2015-01-16 DIAGNOSIS — M2550 Pain in unspecified joint: Secondary | ICD-10-CM | POA: Diagnosis not present

## 2015-01-17 ENCOUNTER — Ambulatory Visit: Payer: Medicare Other | Admitting: Physical Therapy

## 2015-01-17 ENCOUNTER — Encounter: Payer: Self-pay | Admitting: Physical Therapy

## 2015-01-17 DIAGNOSIS — M6281 Muscle weakness (generalized): Secondary | ICD-10-CM | POA: Diagnosis not present

## 2015-01-17 DIAGNOSIS — M546 Pain in thoracic spine: Secondary | ICD-10-CM

## 2015-01-17 DIAGNOSIS — E039 Hypothyroidism, unspecified: Secondary | ICD-10-CM | POA: Diagnosis not present

## 2015-01-17 DIAGNOSIS — I10 Essential (primary) hypertension: Secondary | ICD-10-CM | POA: Diagnosis not present

## 2015-01-17 DIAGNOSIS — M069 Rheumatoid arthritis, unspecified: Secondary | ICD-10-CM | POA: Diagnosis not present

## 2015-01-17 DIAGNOSIS — R29898 Other symptoms and signs involving the musculoskeletal system: Secondary | ICD-10-CM | POA: Diagnosis not present

## 2015-01-17 DIAGNOSIS — R531 Weakness: Secondary | ICD-10-CM

## 2015-01-17 DIAGNOSIS — M810 Age-related osteoporosis without current pathological fracture: Secondary | ICD-10-CM | POA: Diagnosis not present

## 2015-01-17 NOTE — Therapy (Signed)
[facility] [address], [place], [place], [address] Phone: [phone]   Fax:  [phone]  Physical Therapy Treatment  Patient Details  Name: [name] MRN: [id number] Date of Birth: 02-Nov-1933 Referring Provider:  Delrae Rend, MD  Encounter Date: 01/17/2015      PT End of Session - 01/17/15 1219    Visit Number 9   Number of Visits 10   Date for PT Re-Evaluation 02/05/15   PT Start Time 3846   PT Stop Time 6599   PT Time Calculation (min) 50 min   Activity Tolerance Patient tolerated treatment well   Behavior During Therapy [facility] for tasks assessed/performed      Past Medical History  Diagnosis Date  . Rheumatoid arthritis(714.0)   . Hypothyroidism   . Breast cancer 2008    RT, chemo, lumpectomy  . UTI (lower urinary tract infection)   . Diverticulitis   . Kidney tumor 2010    RFA  . HTN (hypertension)   . Hyperlipidemia   . DVT (deep vein thrombosis) in pregnancy     site of PICC catheter  . Prediabetes   . Abnormal cardiovascular stress test 12/2013    s/p cath with mild nonobstructive CAD normal EF - managed medically    Past Surgical History  Procedure Laterality Date  . Tonsillectomy    . Ovarian cyst removal    . Breast lumpectomy    . Abdominal surgery      for ruptured diverticuli  . Cervical spine surgery    . Bilateral foot surgery    . Renal rfa    . Perforation of colon    . Take down of colonostomy  2011  . Cardiac catheterization    . Left heart catheterization with coronary angiogram N/A 01/17/2014    Procedure: LEFT HEART CATHETERIZATION WITH CORONARY ANGIOGRAM;  Surgeon: Peter M Martinique, MD;  Location: [facility] CATH LAB;  Service: Cardiovascular;  Laterality: N/A;    There were no vitals filed for this visit.  Visit Diagnosis:  Decreased strength of trunk and back  Decreased strength  Bilateral thoracic back pain      Subjective Assessment - 01/17/15 1149    Subjective Pt reports very painful after Monday. She does not want to do any exercises that arms go over head.    Currently in Pain? Yes   Pain Score 7    Pain Location Neck  LT arm/shld this AM was not good either.   Pain Orientation Left   Pain Descriptors / Indicators Constant   Pain Type Chronic pain   Aggravating Factors  Overdoing it   Pain Relieving Factors Laying flat   Multiple Pain Sites No                       [facility] Adult PT Treatment/Exercise - 01/17/15 0001    Lumbar Exercises: Standing   Other Standing Lumbar Exercises Standing weight  shif fwd/bkwd only 1 min each   Lumbar Exercises: Supine   Ab Set --  5x 5 sec   Glut Set --  5x5 seconds   Clam --  5x no ball   Clam Limitations yellow band hold 5 sec.    Bridge Limitations ball squeeze, lift hips with gluteal contraction   5x 2 sec hold   Other Supine Lumbar Exercises Decpmoression x3 min with Pilates breathing   Moist Heat Therapy   Number Minutes Moist Heat 20 Minutes   Moist  Heat Location --  Mid to low back   Electrical Stimulation   Electrical Stimulation Location Upper to low lumbar   Electrical Stimulation Action IFC   Electrical Stimulation Parameters 20   Electrical Stimulation Goals Pain                  PT Short Term Goals - 01/15/15 1223    PT SHORT TERM GOAL #3   Title cervical and lumbar pain reduced >/= 25%   Time 4   Period Weeks   Status On-going  10% better           PT Long Term Goals - 01/15/15 1223    PT LONG TERM GOAL #2   Title cervical and lumbar pain decreased >/= 50% due to understanding ways to manage pain   Time 8   Period Weeks   Status On-going  10% better   PT LONG TERM GOAL #3   Title understand home exercise program for trunk and extremity strengthening with correct postures   Time 8   Period Weeks   Status On-going  still learning exercises she can tolerate               Plan - 01/17/15 1219    Clinical Impression  Statement Reduced reps/work load today secondary to pain reports. Pt reports not wanting to reach/squeeze ball with arms as this aggrevates pain.   Pt will benefit from skilled therapeutic intervention in order to improve on the following deficits Difficulty walking;Pain;Decreased strength;Decreased mobility;Decreased balance;Decreased activity tolerance;Decreased endurance;Postural dysfunction   Rehab Potential Good   Clinical Impairments Affecting Rehab Potential None   PT Frequency 2x / week   PT Duration 8 weeks   PT Treatment/Interventions Therapeutic activities;Moist Heat;Patient/family education;Therapeutic exercise;Ultrasound;Gait training;Manual techniques;Cryotherapy;Electrical Stimulation   PT Next Visit Plan FOTO, consider d/c soon.   Consulted and Agree with Plan of Care Patient        Problem List Patient Active Problem List   Diagnosis Date Noted  . Dyspnea on exertion 12/30/2013  . Chest pain on exertion 12/30/2013  . HTN (hypertension) 12/30/2013  . Hyperlipidemia 12/30/2013  . Rheumatoid arthritis   . Hypothyroidism   . Breast cancer     Shawnika Pepin, PTA 01/17/2015, 12:23 PM  [facility] [address], [place], [place], [address] Phone: [phone]   Fax:  [phone]

## 2015-01-22 ENCOUNTER — Encounter: Payer: Self-pay | Admitting: Physical Therapy

## 2015-01-22 ENCOUNTER — Ambulatory Visit: Payer: Medicare Other | Admitting: Physical Therapy

## 2015-01-22 DIAGNOSIS — M069 Rheumatoid arthritis, unspecified: Secondary | ICD-10-CM | POA: Diagnosis not present

## 2015-01-22 DIAGNOSIS — R29898 Other symptoms and signs involving the musculoskeletal system: Secondary | ICD-10-CM | POA: Diagnosis not present

## 2015-01-22 DIAGNOSIS — I10 Essential (primary) hypertension: Secondary | ICD-10-CM | POA: Diagnosis not present

## 2015-01-22 DIAGNOSIS — M546 Pain in thoracic spine: Secondary | ICD-10-CM

## 2015-01-22 DIAGNOSIS — M810 Age-related osteoporosis without current pathological fracture: Secondary | ICD-10-CM | POA: Diagnosis not present

## 2015-01-22 DIAGNOSIS — E039 Hypothyroidism, unspecified: Secondary | ICD-10-CM | POA: Diagnosis not present

## 2015-01-22 DIAGNOSIS — M6281 Muscle weakness (generalized): Secondary | ICD-10-CM | POA: Diagnosis not present

## 2015-01-22 NOTE — Therapy (Signed)
[facility] [address] [place], [place], [address] Phone: [phone]   Fax:  [phone]  Physical Therapy Treatment  Patient Details  Name: [name] MRN: [id number] Date of Birth: 1934-02-12 Referring Provider:  Delrae Rend, MD  Encounter Date: 01/22/2015      PT End of Session - 01/22/15 1226    Visit Number 10   Number of Visits 10   Date for PT Re-Evaluation 02/05/15   PT Start Time 1959   PT Stop Time 7471   PT Time Calculation (min) 50 min   Activity Tolerance Patient tolerated treatment well   Behavior During Therapy [facility] for tasks assessed/performed      Past Medical History  Diagnosis Date  . Rheumatoid arthritis(714.0)   . Hypothyroidism   . Breast cancer 2008    RT, chemo, lumpectomy  . UTI (lower urinary tract infection)   . Diverticulitis   . Kidney tumor 2010    RFA  . HTN (hypertension)   . Hyperlipidemia   . DVT (deep vein thrombosis) in pregnancy     site of PICC catheter  . Prediabetes   . Abnormal cardiovascular stress test 12/2013    s/p cath with mild nonobstructive CAD normal EF - managed medically    Past Surgical History  Procedure Laterality Date  . Tonsillectomy    . Ovarian cyst removal    . Breast lumpectomy    . Abdominal surgery      for ruptured diverticuli  . Cervical spine surgery    . Bilateral foot surgery    . Renal rfa    . Perforation of colon    . Take down of colonostomy  2011  . Cardiac catheterization    . Left heart catheterization with coronary angiogram N/A 01/17/2014    Procedure: LEFT HEART CATHETERIZATION WITH CORONARY ANGIOGRAM;  Surgeon: Peter M Martinique, MD;  Location: [facility] CATH LAB;  Service: Cardiovascular;  Laterality: N/A;    There were no vitals filed for this visit.  Visit Diagnosis:  Bilateral thoracic back pain      Subjective Assessment - 01/22/15 1222    Subjective Did a lot over weekend, scrubbing floors. she hurt after,  would lay flat, then be ok.    Currently in Pain? Yes   Pain Score 7    Pain Location Neck   Pain Orientation Right;Left   Pain Descriptors / Indicators Constant   Pain Type Chronic pain   Aggravating Factors  Overdoing it   Pain Relieving Factors Laying flat   Multiple Pain Sites No                       [facility] Adult PT Treatment/Exercise - 01/22/15 0001    Moist Heat Therapy   Number Minutes Moist Heat 20 Minutes   Moist Heat Location --  Mid to low back   Electrical Stimulation   Electrical Stimulation Location Upper to low lumbar   Electrical Stimulation Action IFC   Electrical Stimulation Parameters 20   Electrical Stimulation Goals Pain   Manual Therapy   Manual Therapy Other (comment)   Other Manual Therapy Supine soft tissue work to cervical spine                PT Education - 01/22/15 1225    Education provided Yes   Education Details More review of current HEP. Did not add any new exercises.   Person(s) Educated Patient   Methods  Explanation   Comprehension Verbalized understanding          PT Short Term Goals - 2015-02-20 1159    PT SHORT TERM GOAL #1   Title (p) understand ways to manage her osteoporosis with diet   Time (p) 4   Period (p) Weeks   Status (p) Achieved   PT SHORT TERM GOAL #2   Title (p) understand what postures to avoid to decrease spinal fractures.   Time (p) 4   Period (p) Weeks   Status (p) Achieved   PT SHORT TERM GOAL #3   Title (p) cervical and lumbar pain reduced >/= 25%   Time (p) 4   Period (p) Weeks   Status (p) Achieved           PT Long Term Goals - 20-Feb-2015 1156    PT LONG TERM GOAL #1   Title return demostation on correct lifting and perform daily activities with correct posture   Time 8   Period Weeks   Status Achieved   PT LONG TERM GOAL #2   Title cervical and lumbar pain decreased >/= 50% due to understanding ways to manage pain   Time 8   Period Weeks   Status --  40% back, neck  30%   PT LONG TERM GOAL #3   Title understand home exercise program for trunk and extremity strengthening with correct postures   Time 8   Period Weeks   Status Achieved               Plan - 02-20-2015 1226    Clinical Impression Statement Pt can abolish or at least greatly relieve her pain with positional changes. Met STG for pain.   Pt will benefit from skilled therapeutic intervention in order to improve on the following deficits Difficulty walking;Pain;Decreased strength;Decreased mobility;Decreased balance;Decreased activity tolerance;Decreased endurance;Postural dysfunction   Rehab Potential Good   Clinical Impairments Affecting Rehab Potential None   PT Frequency 2x / week   PT Duration 8 weeks   PT Treatment/Interventions Therapeutic activities;Moist Heat;Patient/family education;Therapeutic exercise;Ultrasound;Gait training;Manual techniques;Cryotherapy;Electrical Stimulation   PT Next Visit Plan Consider D/C   Consulted and Agree with Plan of Care Patient          G-Codes - Feb 20, 2015 1408    Functional Assessment Tool Used Therapist discretion is limited by 25% due to achieving her goals, decreased pain and improved function.    Functional Limitation Other PT primary   Other PT Primary Current Status (S9373) At least 20 percent but less than 40 percent impaired, limited or restricted   Other PT Primary Goal Status (S2876) At least 20 percent but less than 40 percent impaired, limited or restricted      Problem List Patient Active Problem List   Diagnosis Date Noted  . Dyspnea on exertion 12/30/2013  . Chest pain on exertion 12/30/2013  . HTN (hypertension) 12/30/2013  . Hyperlipidemia 12/30/2013  . Rheumatoid arthritis   . Hypothyroidism   . Breast cancer   Earlie Counts, PT 02-20-2015 2:11 PM   Myrene Galas, PTA 2015/02/20 2:11 PM  [facility] [address] [place], [place],  [address] Phone: [phone]   Fax:  [phone]

## 2015-01-24 ENCOUNTER — Encounter: Payer: Self-pay | Admitting: Podiatrist

## 2015-01-24 ENCOUNTER — Encounter: Payer: Self-pay | Admitting: Physical Therapy

## 2015-01-24 ENCOUNTER — Ambulatory Visit: Payer: Medicare Other | Admitting: Physical Therapy

## 2015-01-24 ENCOUNTER — Ambulatory Visit (INDEPENDENT_AMBULATORY_CARE_PROVIDER_SITE_OTHER): Payer: Medicare Other | Admitting: Podiatrist

## 2015-01-24 DIAGNOSIS — E039 Hypothyroidism, unspecified: Secondary | ICD-10-CM | POA: Diagnosis not present

## 2015-01-24 DIAGNOSIS — M216X9 Other acquired deformities of unspecified foot: Secondary | ICD-10-CM

## 2015-01-24 DIAGNOSIS — L84 Corns and callosities: Secondary | ICD-10-CM | POA: Diagnosis not present

## 2015-01-24 DIAGNOSIS — M6281 Muscle weakness (generalized): Secondary | ICD-10-CM | POA: Diagnosis not present

## 2015-01-24 DIAGNOSIS — M069 Rheumatoid arthritis, unspecified: Secondary | ICD-10-CM | POA: Diagnosis not present

## 2015-01-24 DIAGNOSIS — M810 Age-related osteoporosis without current pathological fracture: Secondary | ICD-10-CM | POA: Diagnosis not present

## 2015-01-24 DIAGNOSIS — R29898 Other symptoms and signs involving the musculoskeletal system: Secondary | ICD-10-CM

## 2015-01-24 DIAGNOSIS — I10 Essential (primary) hypertension: Secondary | ICD-10-CM | POA: Diagnosis not present

## 2015-01-24 NOTE — Therapy (Signed)
[facility] [address] [place], [place], [address] Phone: [phone]   Fax:  [phone]  Physical Therapy Treatment  Patient Details  Name: [name] MRN: [id number] Date of Birth: Mar 19, 1934 Referring Provider:  Delrae Rend, MD  Encounter Date: 01/24/2015      PT End of Session - 01/24/15 1156    Visit Number 11   Number of Visits 10   Date for PT Re-Evaluation 02/05/15   PT Start Time 1638   PT Stop Time 1230   PT Time Calculation (min) 45 min   Activity Tolerance Patient tolerated treatment well   Behavior During Therapy [facility] for tasks assessed/performed      Past Medical History  Diagnosis Date  . Rheumatoid arthritis(714.0)   . Hypothyroidism   . Breast cancer 2008    RT, chemo, lumpectomy  . UTI (lower urinary tract infection)   . Diverticulitis   . Kidney tumor 2010    RFA  . HTN (hypertension)   . Hyperlipidemia   . DVT (deep vein thrombosis) in pregnancy     site of PICC catheter  . Prediabetes   . Abnormal cardiovascular stress test 12/2013    s/p cath with mild nonobstructive CAD normal EF - managed medically    Past Surgical History  Procedure Laterality Date  . Tonsillectomy    . Ovarian cyst removal    . Breast lumpectomy    . Abdominal surgery      for ruptured diverticuli  . Cervical spine surgery    . Bilateral foot surgery    . Renal rfa    . Perforation of colon    . Take down of colonostomy  2011  . Cardiac catheterization    . Left heart catheterization with coronary angiogram N/A 01/17/2014    Procedure: LEFT HEART CATHETERIZATION WITH CORONARY ANGIOGRAM;  Surgeon: Peter M Martinique, MD;  Location: [facility] CATH LAB;  Service: Cardiovascular;  Laterality: N/A;    There were no vitals filed for this visit.  Visit Diagnosis:  Decreased strength of trunk and back      Subjective Assessment - 01/24/15 1224    Subjective I can not do exercises overhead due to aggravating  my neck.    Pertinent History Patient has cervical surgery 1994 making it difficult to lift. Difficulty with walking due to Rheumatoid arthritis; Breast and kidney cancer   How long can you stand comfortably? 40 minutes   Patient Stated Goals Relief from pain, less stiff   Currently in Pain? Yes   Pain Score 5    Pain Location Neck   Pain Orientation Right;Left   Pain Descriptors / Indicators Constant   Pain Type Chronic pain   Pain Onset More than a month ago   Pain Frequency Constant   Aggravating Factors  overdoing it   Pain Relieving Factors laying flat   Effect of Pain on Daily Activities raising arms overhead   Multiple Pain Sites No            [facility] PT Assessment - 01/24/15 0001    AROM   Cervical Extension decreased by 25%   Cervical - Right Side Bend decreased by 25%   Cervical - Left Side Bend decreased by 25%   Cervical - Right Rotation decreased by 25%   Cervical - Left Rotation decreased by 25%   Lumbar Extension decreased by 75%   Lumbar - Right Side Bend decreased 50%   Lumbar - Left Side Bend  decreased by 50%   Strength   Overall Strength Within functional limits for tasks performed                   [facility] Adult PT Treatment/Exercise - Feb 14, 2015 0001    Lumbar Exercises: Supine   Bridge Limitations ball squeeze, lift hips with gluteal contraction   5x 2 sec hold   Other Supine Lumbar Exercises Decompression exercise series, verbal cues ot contract 50%.                 PT Education - 2015/02/14 1226    Education provided Yes   Education Details hip adduction with ball, bridge with hip adduction   Person(s) Educated Patient   Methods Explanation;Demonstration;Handout;Tactile cues;Verbal cues   Comprehension Verbalized understanding;Returned demonstration     PT reviewed patient past HEP and patient able to return demonstration correctly.      PT Short Term Goals - Feb 14, 2015 1153    PT SHORT TERM GOAL #3   Title cervical and lumbar  pain reduced >/= 25%   Time 4   Period Weeks   Status Not Met  back is 25% better but no change in cervical           PT Long Term Goals - 02/14/15 1154    PT LONG TERM GOAL #2   Title cervical and lumbar pain decreased >/= 50% due to understanding ways to manage pain   Time 8   Period Weeks   Status Not Met  back 40% better, no change in neck   PT LONG TERM GOAL #3   Title understand home exercise program for trunk and extremity strengthening with correct postures   Time 8   Period Weeks   Status Achieved               Plan - 02/14/15 1226    Clinical Impression Statement Patient has met all of her goals with the exception of cervical and lumbar pain.     Pt will benefit from skilled therapeutic intervention in order to improve on the following deficits Difficulty walking;Pain;Decreased strength;Decreased mobility;Decreased balance;Decreased activity tolerance;Decreased endurance;Postural dysfunction   Rehab Potential Good   Clinical Impairments Affecting Rehab Potential None   PT Frequency 2x / week   PT Duration 8 weeks   PT Treatment/Interventions Therapeutic activities;Moist Heat;Patient/family education;Therapeutic exercise;Ultrasound;Gait training;Manual techniques;Cryotherapy;Electrical Stimulation   PT Next Visit Plan D/C   PT Home Exercise Plan continue with curren HEP   Consulted and Agree with Plan of Care Patient          G-Codes - 02/14/2015 1227    Functional Assessment Tool Used Therapist discretion is limited by 25% due to achieving her goals, decreased pain and improved function.    Functional Limitation Other PT primary   Other PT Primary Goal Status (Y7829) At least 20 percent but less than 40 percent impaired, limited or restricted   Other PT Primary Discharge Status [phone]) At least 20 percent but less than 40 percent impaired, limited or restricted      Problem List Patient Active Problem List   Diagnosis Date Noted  . Dyspnea on  exertion 12/30/2013  . Chest pain on exertion 12/30/2013  . HTN (hypertension) 12/30/2013  . Hyperlipidemia 12/30/2013  . Rheumatoid arthritis(714.0)   . Hypothyroidism   . Breast cancer     Osmar Howton,PT 02-14-2015, 12:32 PM  [facility] [address], [place], [place], [address] Phone: [phone]   Fax:  [phone]  PHYSICAL THERAPY DISCHARGE SUMMARY  Visits from Start of Care: 11  Current functional level related to goals / functional outcomes: See above goals for assessment. Patient has met all goals except for cervical and lumbar pain.    Remaining deficits:  Patient has to be careful with overhead exercises due to causing spasm in cervical musculature.  Education / Equipment: HEP Plan: Patient agrees to discharge.  Patient goals were met. Patient is being discharged due to meeting the stated rehab goals. Thank you for the referral.  Earlie Counts, PT 01/24/2015 12:32 PM ?????

## 2015-01-24 NOTE — Patient Instructions (Addendum)
Strengthening: Hip Adduction - Isometric   With ball or folded pillow between knees, squeeze knees together. Hold _5___ seconds. Repeat _10___ times per set. Do ___1_ sets per session. Do ____ sessions per day. Lay on back.  Then squeeze ball and lift hips.   http://orth.exer.us/613   Copyright  VHI. All rights reserved.  Patient able to return demonstration correctly.

## 2015-02-05 DIAGNOSIS — Z980 Intestinal bypass and anastomosis status: Secondary | ICD-10-CM | POA: Diagnosis not present

## 2015-02-05 DIAGNOSIS — Z09 Encounter for follow-up examination after completed treatment for conditions other than malignant neoplasm: Secondary | ICD-10-CM | POA: Diagnosis not present

## 2015-02-05 DIAGNOSIS — Z85038 Personal history of other malignant neoplasm of large intestine: Secondary | ICD-10-CM | POA: Diagnosis not present

## 2015-02-06 NOTE — Progress Notes (Signed)
Chief Complaint  Patient presents with  . Callouses    "Just callouses." B/L 2-3 MPJ plantar      Physical Exam  Neurovascular status is intact and unchanged from the previous visit with palpable pedal pulses present and neurological sensation intact. Prominent and plantar flexed metatarsals 2, 3, 4 of the right foot are noted. She has a callus submetatarsal 2 of bilateral feet.  No area of skin breakdown is seen. No ulcers noted.   Assessment: callus x 2  Plan: debridement of lesions accomplished today without complication. She will return prn.

## 2015-04-02 IMAGING — CR DG THORACIC SPINE 3V
3 series · 3 of 3 positions shown · non-contrast
Comparison: PA and lateral chest x-ray [DATE] and
June 16, 2009.

CLINICAL DATA: Four-six month history of mid thoracic pain without
known injury

EXAM:
THORACIC SPINE - 2 VIEW + SWIMMERS

[t t-spine a.p.]
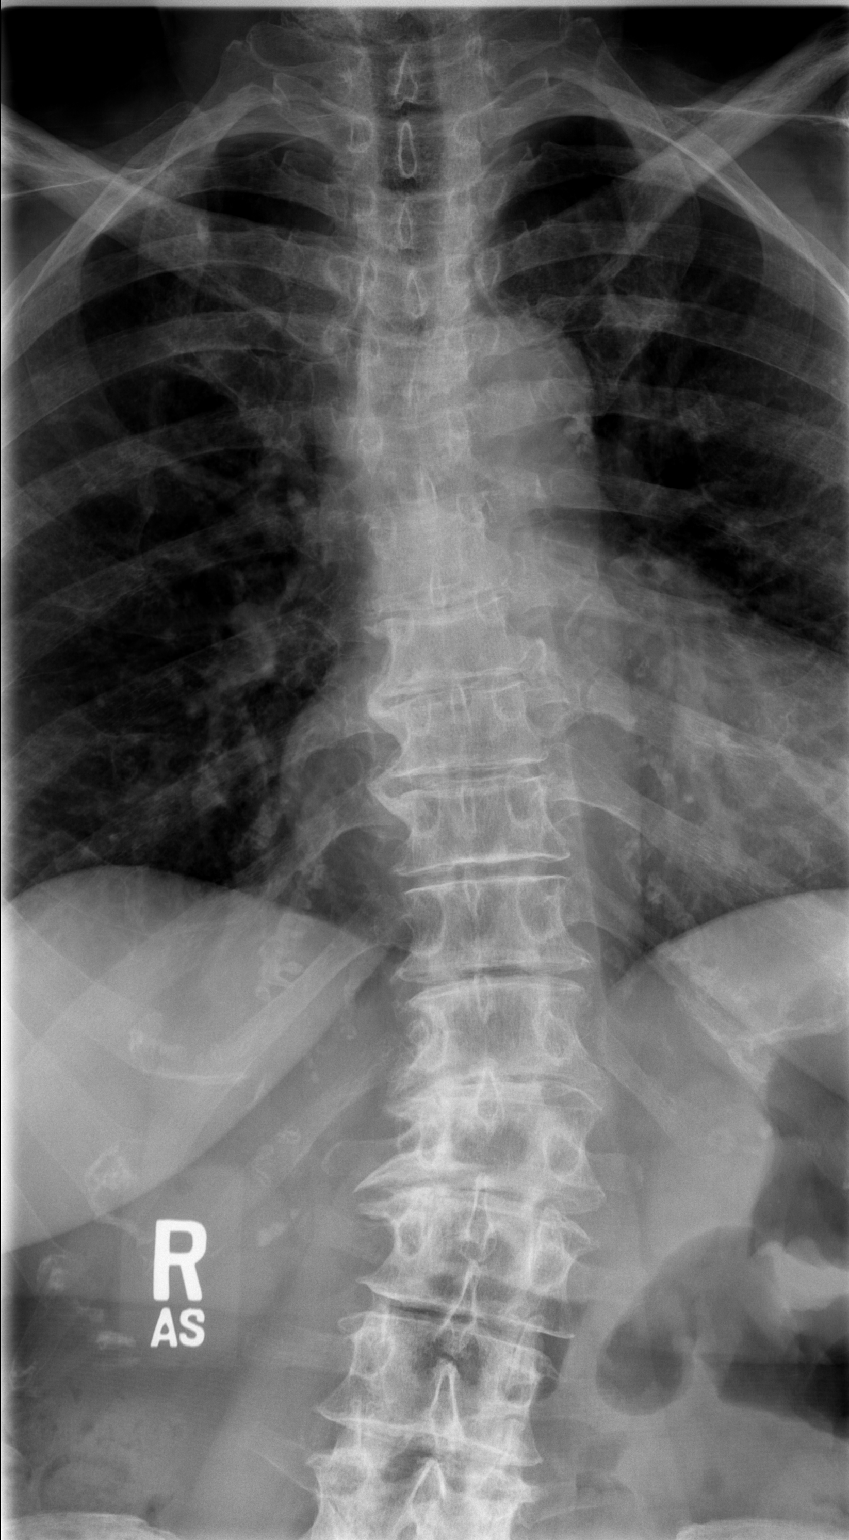

[t t-spine lat *]
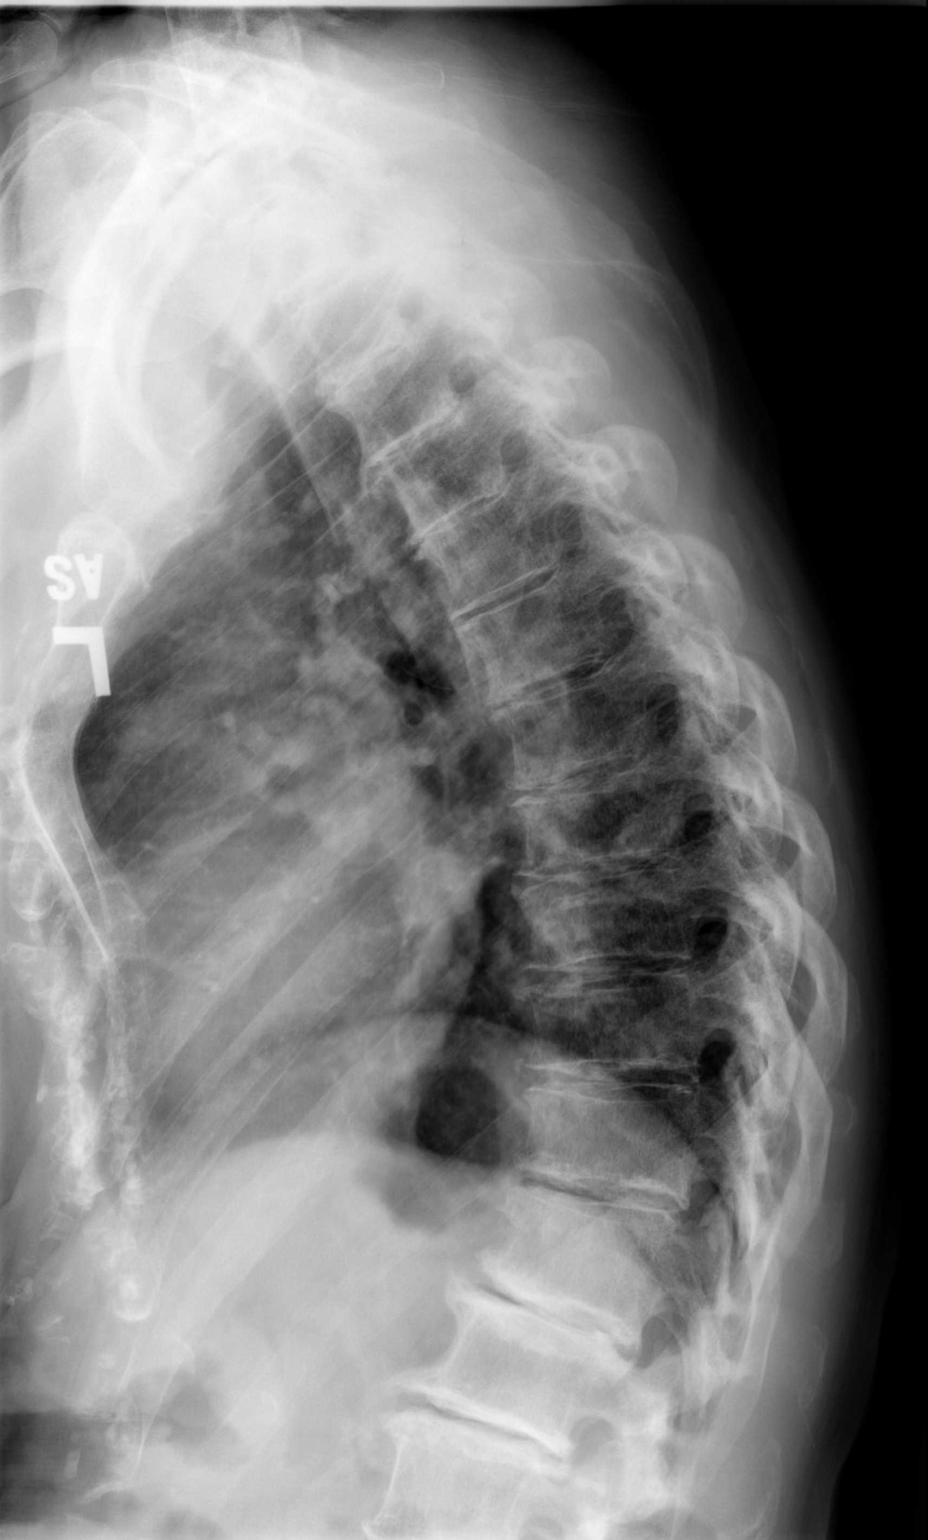

[t swimmers]
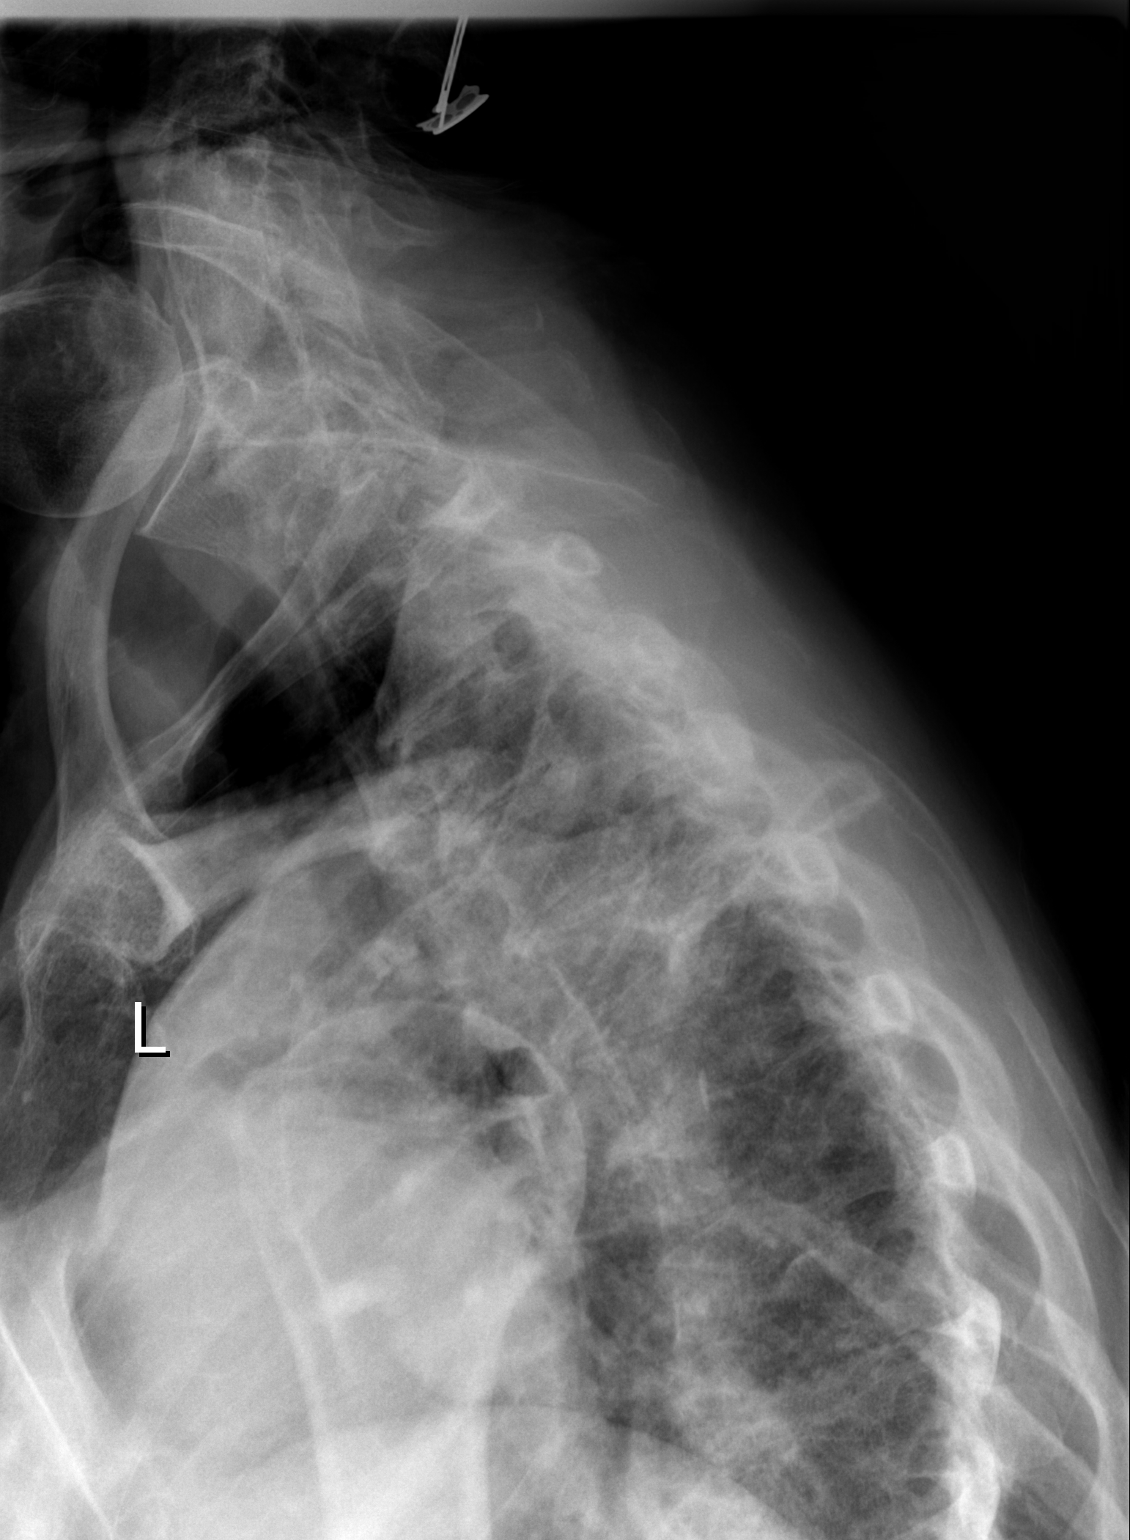

[3 of 3 positions shown; findings below may reference images not displayed]

FINDINGS: There is levoocurvature of the thoracolumbar spine centered at the
thoracolumbar junction. This is more conspicuous than in the past.
The degree of angulation is nearly 20 degrees on this non standing
view. There is mild loss of height of the bodies of T8 through T10
which is stable. There are right lateral osteophytes at T7-8, T8-9,
and T9-10. The pedicles are intact. There are no abnormal
paravertebral soft tissue densities. There is mild multilevel disc
space narrowing.
IMPRESSION: There is multilevel degenerative disc disease of the thoracic spine.
There is mild loss of height of the bodies of T8-T10. There is
moderate levo curvature centered at the thoracolumbar junction.

MRI of the thoracic spine would be useful to assess the thoracic
discs and to assess the spine for evidence of marrow edema.

## 2015-04-04 ENCOUNTER — Encounter: Payer: Self-pay | Admitting: Podiatry

## 2015-04-04 ENCOUNTER — Ambulatory Visit (INDEPENDENT_AMBULATORY_CARE_PROVIDER_SITE_OTHER): Payer: Medicare Other | Admitting: Podiatry

## 2015-04-04 VITALS — BP 152/88 | HR 78 | Resp 12

## 2015-04-04 DIAGNOSIS — M216X9 Other acquired deformities of unspecified foot: Secondary | ICD-10-CM

## 2015-04-04 DIAGNOSIS — L84 Corns and callosities: Secondary | ICD-10-CM | POA: Diagnosis not present

## 2015-04-05 NOTE — Progress Notes (Signed)
Patient ID: [name], female   DOB: 1934-08-30, 81 y.o.   MRN: [id number]  Subjective: This patient presents complaining of painful plantar calluses on the right foot  Objective: Prominent metatarsal heads 2-4 right with overlying plantar keratoses  Assessment: Rheumatoid arthritis with deformity Plantar keratoses 2  Plan: Debrided plantar keratoses 2 without any bleeding  Attach metatarsal pads Patient wears accommodative orthotics  Reappoint at patient's request

## 2015-04-17 DIAGNOSIS — M2550 Pain in unspecified joint: Secondary | ICD-10-CM | POA: Diagnosis not present

## 2015-04-17 DIAGNOSIS — Z79899 Other long term (current) drug therapy: Secondary | ICD-10-CM | POA: Diagnosis not present

## 2015-04-17 DIAGNOSIS — M0579 Rheumatoid arthritis with rheumatoid factor of multiple sites without organ or systems involvement: Secondary | ICD-10-CM | POA: Diagnosis not present

## 2015-04-24 DIAGNOSIS — S76302A Unspecified injury of muscle, fascia and tendon of the posterior muscle group at thigh level, left thigh, initial encounter: Secondary | ICD-10-CM | POA: Diagnosis not present

## 2015-05-02 DIAGNOSIS — L814 Other melanin hyperpigmentation: Secondary | ICD-10-CM | POA: Diagnosis not present

## 2015-05-02 DIAGNOSIS — D2262 Melanocytic nevi of left upper limb, including shoulder: Secondary | ICD-10-CM | POA: Diagnosis not present

## 2015-05-02 DIAGNOSIS — D1801 Hemangioma of skin and subcutaneous tissue: Secondary | ICD-10-CM | POA: Diagnosis not present

## 2015-05-02 DIAGNOSIS — L570 Actinic keratosis: Secondary | ICD-10-CM | POA: Diagnosis not present

## 2015-05-02 DIAGNOSIS — R21 Rash and other nonspecific skin eruption: Secondary | ICD-10-CM | POA: Diagnosis not present

## 2015-05-02 DIAGNOSIS — Z85828 Personal history of other malignant neoplasm of skin: Secondary | ICD-10-CM | POA: Diagnosis not present

## 2015-05-02 DIAGNOSIS — L821 Other seborrheic keratosis: Secondary | ICD-10-CM | POA: Diagnosis not present

## 2015-05-02 DIAGNOSIS — D2261 Melanocytic nevi of right upper limb, including shoulder: Secondary | ICD-10-CM | POA: Diagnosis not present

## 2015-05-02 DIAGNOSIS — D225 Melanocytic nevi of trunk: Secondary | ICD-10-CM | POA: Diagnosis not present

## 2015-05-04 ENCOUNTER — Other Ambulatory Visit: Payer: Self-pay

## 2015-05-04 DIAGNOSIS — Z1231 Encounter for screening mammogram for malignant neoplasm of breast: Secondary | ICD-10-CM

## 2015-05-07 DIAGNOSIS — M79605 Pain in left leg: Secondary | ICD-10-CM | POA: Diagnosis not present

## 2015-05-15 DIAGNOSIS — S76312A Strain of muscle, fascia and tendon of the posterior muscle group at thigh level, left thigh, initial encounter: Secondary | ICD-10-CM | POA: Diagnosis not present

## 2015-05-15 DIAGNOSIS — M25552 Pain in left hip: Secondary | ICD-10-CM | POA: Diagnosis not present

## 2015-05-24 DIAGNOSIS — M25552 Pain in left hip: Secondary | ICD-10-CM | POA: Diagnosis not present

## 2015-05-24 DIAGNOSIS — S76312D Strain of muscle, fascia and tendon of the posterior muscle group at thigh level, left thigh, subsequent encounter: Secondary | ICD-10-CM | POA: Diagnosis not present

## 2015-06-01 DIAGNOSIS — S76912D Strain of unspecified muscles, fascia and tendons at thigh level, left thigh, subsequent encounter: Secondary | ICD-10-CM | POA: Diagnosis not present

## 2015-06-07 DIAGNOSIS — Z0000 Encounter for general adult medical examination without abnormal findings: Secondary | ICD-10-CM | POA: Diagnosis not present

## 2015-06-07 DIAGNOSIS — F419 Anxiety disorder, unspecified: Secondary | ICD-10-CM | POA: Diagnosis not present

## 2015-06-07 DIAGNOSIS — K219 Gastro-esophageal reflux disease without esophagitis: Secondary | ICD-10-CM | POA: Diagnosis not present

## 2015-06-07 DIAGNOSIS — M0579 Rheumatoid arthritis with rheumatoid factor of multiple sites without organ or systems involvement: Secondary | ICD-10-CM | POA: Diagnosis not present

## 2015-06-07 DIAGNOSIS — I10 Essential (primary) hypertension: Secondary | ICD-10-CM | POA: Diagnosis not present

## 2015-06-07 DIAGNOSIS — E039 Hypothyroidism, unspecified: Secondary | ICD-10-CM | POA: Diagnosis not present

## 2015-06-07 DIAGNOSIS — M810 Age-related osteoporosis without current pathological fracture: Secondary | ICD-10-CM | POA: Diagnosis not present

## 2015-06-07 DIAGNOSIS — R7309 Other abnormal glucose: Secondary | ICD-10-CM | POA: Diagnosis not present

## 2015-06-07 DIAGNOSIS — Z853 Personal history of malignant neoplasm of breast: Secondary | ICD-10-CM | POA: Diagnosis not present

## 2015-06-07 DIAGNOSIS — R1310 Dysphagia, unspecified: Secondary | ICD-10-CM | POA: Diagnosis not present

## 2015-06-07 DIAGNOSIS — E780 Pure hypercholesterolemia: Secondary | ICD-10-CM | POA: Diagnosis not present

## 2015-06-08 ENCOUNTER — Other Ambulatory Visit: Payer: Self-pay | Admitting: Family Medicine

## 2015-06-08 ENCOUNTER — Ambulatory Visit
Admission: RE | Admit: 2015-06-08 | Discharge: 2015-06-08 | Disposition: A | Discharge status: Home / Self Care | Payer: Medicare Other | Source: Ambulatory Visit

## 2015-06-08 DIAGNOSIS — R1310 Dysphagia, unspecified: Secondary | ICD-10-CM

## 2015-06-08 DIAGNOSIS — Z1231 Encounter for screening mammogram for malignant neoplasm of breast: Secondary | ICD-10-CM | POA: Diagnosis not present

## 2015-06-12 ENCOUNTER — Ambulatory Visit: Payer: Medicare Other | Admitting: Podiatry

## 2015-06-12 DIAGNOSIS — L97529 Non-pressure chronic ulcer of other part of left foot with unspecified severity: Secondary | ICD-10-CM | POA: Diagnosis not present

## 2015-06-12 DIAGNOSIS — M79672 Pain in left foot: Secondary | ICD-10-CM | POA: Diagnosis not present

## 2015-06-13 ENCOUNTER — Ambulatory Visit
Admission: RE | Admit: 2015-06-13 | Discharge: 2015-06-13 | Disposition: A | Discharge status: Home / Self Care | Payer: Medicare Other | Source: Ambulatory Visit | Attending: Family Medicine | Admitting: Family Medicine

## 2015-06-13 ENCOUNTER — Ambulatory Visit: Payer: Medicare Other | Admitting: Podiatry

## 2015-06-13 ENCOUNTER — Other Ambulatory Visit: Payer: Self-pay | Admitting: Geriatric Medicine

## 2015-06-13 DIAGNOSIS — R1310 Dysphagia, unspecified: Secondary | ICD-10-CM | POA: Diagnosis not present

## 2015-06-14 ENCOUNTER — Ambulatory Visit: Payer: Medicare Other | Admitting: Podiatry

## 2015-06-19 ENCOUNTER — Ambulatory Visit: Payer: Medicare Other | Admitting: Podiatry

## 2015-06-19 ENCOUNTER — Other Ambulatory Visit: Payer: Self-pay | Admitting: Family Medicine

## 2015-06-19 DIAGNOSIS — E049 Nontoxic goiter, unspecified: Secondary | ICD-10-CM

## 2015-06-19 DIAGNOSIS — L97529 Non-pressure chronic ulcer of other part of left foot with unspecified severity: Secondary | ICD-10-CM | POA: Diagnosis not present

## 2015-06-19 DIAGNOSIS — M7742 Metatarsalgia, left foot: Secondary | ICD-10-CM | POA: Diagnosis not present

## 2015-06-22 DIAGNOSIS — M810 Age-related osteoporosis without current pathological fracture: Secondary | ICD-10-CM | POA: Diagnosis not present

## 2015-06-26 ENCOUNTER — Ambulatory Visit
Admission: RE | Admit: 2015-06-26 | Discharge: 2015-06-26 | Disposition: A | Discharge status: Home / Self Care | Payer: Medicare Other | Source: Ambulatory Visit | Attending: Family Medicine | Admitting: Family Medicine

## 2015-06-26 DIAGNOSIS — E049 Nontoxic goiter, unspecified: Secondary | ICD-10-CM

## 2015-07-12 DIAGNOSIS — Z23 Encounter for immunization: Secondary | ICD-10-CM | POA: Diagnosis not present

## 2015-07-17 DIAGNOSIS — L97529 Non-pressure chronic ulcer of other part of left foot with unspecified severity: Secondary | ICD-10-CM | POA: Diagnosis not present

## 2015-07-17 DIAGNOSIS — L97519 Non-pressure chronic ulcer of other part of right foot with unspecified severity: Secondary | ICD-10-CM | POA: Diagnosis not present

## 2015-07-18 DIAGNOSIS — Z79899 Other long term (current) drug therapy: Secondary | ICD-10-CM | POA: Diagnosis not present

## 2015-07-18 DIAGNOSIS — M79672 Pain in left foot: Secondary | ICD-10-CM | POA: Diagnosis not present

## 2015-07-18 DIAGNOSIS — M79671 Pain in right foot: Secondary | ICD-10-CM | POA: Diagnosis not present

## 2015-07-18 DIAGNOSIS — S76302A Unspecified injury of muscle, fascia and tendon of the posterior muscle group at thigh level, left thigh, initial encounter: Secondary | ICD-10-CM | POA: Diagnosis not present

## 2015-07-18 DIAGNOSIS — M0579 Rheumatoid arthritis with rheumatoid factor of multiple sites without organ or systems involvement: Secondary | ICD-10-CM | POA: Diagnosis not present

## 2015-07-19 DIAGNOSIS — M810 Age-related osteoporosis without current pathological fracture: Secondary | ICD-10-CM | POA: Diagnosis not present

## 2015-08-21 DIAGNOSIS — M7742 Metatarsalgia, left foot: Secondary | ICD-10-CM | POA: Diagnosis not present

## 2015-08-21 DIAGNOSIS — M7741 Metatarsalgia, right foot: Secondary | ICD-10-CM | POA: Diagnosis not present

## 2015-08-27 DIAGNOSIS — H04211 Epiphora due to excess lacrimation, right lacrimal gland: Secondary | ICD-10-CM | POA: Diagnosis not present

## 2015-08-30 DIAGNOSIS — S76912D Strain of unspecified muscles, fascia and tendons at thigh level, left thigh, subsequent encounter: Secondary | ICD-10-CM | POA: Diagnosis not present

## 2015-09-03 DIAGNOSIS — S76912D Strain of unspecified muscles, fascia and tendons at thigh level, left thigh, subsequent encounter: Secondary | ICD-10-CM | POA: Diagnosis not present

## 2015-09-11 DIAGNOSIS — S76912D Strain of unspecified muscles, fascia and tendons at thigh level, left thigh, subsequent encounter: Secondary | ICD-10-CM | POA: Diagnosis not present

## 2015-09-13 DIAGNOSIS — S76912D Strain of unspecified muscles, fascia and tendons at thigh level, left thigh, subsequent encounter: Secondary | ICD-10-CM | POA: Diagnosis not present

## 2015-09-18 DIAGNOSIS — S76912D Strain of unspecified muscles, fascia and tendons at thigh level, left thigh, subsequent encounter: Secondary | ICD-10-CM | POA: Diagnosis not present

## 2015-09-21 DIAGNOSIS — S76912D Strain of unspecified muscles, fascia and tendons at thigh level, left thigh, subsequent encounter: Secondary | ICD-10-CM | POA: Diagnosis not present

## 2015-09-24 DIAGNOSIS — H04551 Acquired stenosis of right nasolacrimal duct: Secondary | ICD-10-CM | POA: Diagnosis not present

## 2015-09-26 DIAGNOSIS — S76912D Strain of unspecified muscles, fascia and tendons at thigh level, left thigh, subsequent encounter: Secondary | ICD-10-CM | POA: Diagnosis not present

## 2015-10-01 DIAGNOSIS — S76912D Strain of unspecified muscles, fascia and tendons at thigh level, left thigh, subsequent encounter: Secondary | ICD-10-CM | POA: Diagnosis not present

## 2015-10-02 DIAGNOSIS — I739 Peripheral vascular disease, unspecified: Secondary | ICD-10-CM | POA: Diagnosis not present

## 2015-10-02 DIAGNOSIS — L84 Corns and callosities: Secondary | ICD-10-CM | POA: Diagnosis not present

## 2015-10-18 DIAGNOSIS — M79671 Pain in right foot: Secondary | ICD-10-CM | POA: Diagnosis not present

## 2015-10-18 DIAGNOSIS — M79672 Pain in left foot: Secondary | ICD-10-CM | POA: Diagnosis not present

## 2015-10-18 DIAGNOSIS — Z79899 Other long term (current) drug therapy: Secondary | ICD-10-CM | POA: Diagnosis not present

## 2015-10-18 DIAGNOSIS — M0579 Rheumatoid arthritis with rheumatoid factor of multiple sites without organ or systems involvement: Secondary | ICD-10-CM | POA: Diagnosis not present

## 2015-11-15 DIAGNOSIS — L97529 Non-pressure chronic ulcer of other part of left foot with unspecified severity: Secondary | ICD-10-CM | POA: Diagnosis not present

## 2015-12-10 DIAGNOSIS — F419 Anxiety disorder, unspecified: Secondary | ICD-10-CM | POA: Diagnosis not present

## 2015-12-10 DIAGNOSIS — E039 Hypothyroidism, unspecified: Secondary | ICD-10-CM | POA: Diagnosis not present

## 2015-12-10 DIAGNOSIS — M810 Age-related osteoporosis without current pathological fracture: Secondary | ICD-10-CM | POA: Diagnosis not present

## 2015-12-10 DIAGNOSIS — E7800 Pure hypercholesterolemia, unspecified: Secondary | ICD-10-CM | POA: Diagnosis not present

## 2015-12-10 DIAGNOSIS — I10 Essential (primary) hypertension: Secondary | ICD-10-CM | POA: Diagnosis not present

## 2015-12-10 DIAGNOSIS — M0579 Rheumatoid arthritis with rheumatoid factor of multiple sites without organ or systems involvement: Secondary | ICD-10-CM | POA: Diagnosis not present

## 2015-12-10 DIAGNOSIS — R7309 Other abnormal glucose: Secondary | ICD-10-CM | POA: Diagnosis not present

## 2015-12-19 DIAGNOSIS — S76011A Strain of muscle, fascia and tendon of right hip, initial encounter: Secondary | ICD-10-CM | POA: Diagnosis not present

## 2015-12-24 DIAGNOSIS — Z961 Presence of intraocular lens: Secondary | ICD-10-CM | POA: Diagnosis not present

## 2015-12-24 DIAGNOSIS — H04221 Epiphora due to insufficient drainage, right lacrimal gland: Secondary | ICD-10-CM | POA: Diagnosis not present

## 2015-12-28 ENCOUNTER — Other Ambulatory Visit: Payer: Self-pay | Admitting: Family Medicine

## 2015-12-31 ENCOUNTER — Ambulatory Visit
Admission: RE | Admit: 2015-12-31 | Discharge: 2015-12-31 | Disposition: A | Discharge status: Home / Self Care | Payer: Medicare Other | Source: Ambulatory Visit | Attending: Family Medicine | Admitting: Family Medicine

## 2015-12-31 ENCOUNTER — Other Ambulatory Visit: Payer: Self-pay | Admitting: Family Medicine

## 2015-12-31 DIAGNOSIS — M1611 Unilateral primary osteoarthritis, right hip: Secondary | ICD-10-CM | POA: Diagnosis not present

## 2015-12-31 DIAGNOSIS — M25551 Pain in right hip: Secondary | ICD-10-CM

## 2016-01-04 DIAGNOSIS — L821 Other seborrheic keratosis: Secondary | ICD-10-CM | POA: Diagnosis not present

## 2016-01-04 DIAGNOSIS — Z85828 Personal history of other malignant neoplasm of skin: Secondary | ICD-10-CM | POA: Diagnosis not present

## 2016-01-04 DIAGNOSIS — D485 Neoplasm of uncertain behavior of skin: Secondary | ICD-10-CM | POA: Diagnosis not present

## 2016-01-04 DIAGNOSIS — L570 Actinic keratosis: Secondary | ICD-10-CM | POA: Diagnosis not present

## 2016-01-16 DIAGNOSIS — Z79899 Other long term (current) drug therapy: Secondary | ICD-10-CM | POA: Diagnosis not present

## 2016-01-16 DIAGNOSIS — M0579 Rheumatoid arthritis with rheumatoid factor of multiple sites without organ or systems involvement: Secondary | ICD-10-CM | POA: Diagnosis not present

## 2016-01-16 DIAGNOSIS — M79671 Pain in right foot: Secondary | ICD-10-CM | POA: Diagnosis not present

## 2016-01-16 DIAGNOSIS — M79672 Pain in left foot: Secondary | ICD-10-CM | POA: Diagnosis not present

## 2016-01-18 DIAGNOSIS — M810 Age-related osteoporosis without current pathological fracture: Secondary | ICD-10-CM | POA: Diagnosis not present

## 2016-01-18 DIAGNOSIS — M7061 Trochanteric bursitis, right hip: Secondary | ICD-10-CM | POA: Diagnosis not present

## 2016-01-29 DIAGNOSIS — L97511 Non-pressure chronic ulcer of other part of right foot limited to breakdown of skin: Secondary | ICD-10-CM | POA: Diagnosis not present

## 2016-01-29 DIAGNOSIS — L97521 Non-pressure chronic ulcer of other part of left foot limited to breakdown of skin: Secondary | ICD-10-CM | POA: Diagnosis not present

## 2016-01-30 ENCOUNTER — Other Ambulatory Visit: Payer: Self-pay | Admitting: Urology

## 2016-01-30 DIAGNOSIS — D3000 Benign neoplasm of unspecified kidney: Secondary | ICD-10-CM

## 2016-02-15 ENCOUNTER — Ambulatory Visit (HOSPITAL_COMMUNITY)
Admission: RE | Admit: 2016-02-15 | Discharge: 2016-02-15 | Disposition: A | Discharge status: Home / Self Care | Payer: Medicare Other | Source: Ambulatory Visit | Attending: Urology | Admitting: Urology

## 2016-02-15 DIAGNOSIS — Z85528 Personal history of other malignant neoplasm of kidney: Secondary | ICD-10-CM | POA: Diagnosis not present

## 2016-02-15 DIAGNOSIS — D3000 Benign neoplasm of unspecified kidney: Secondary | ICD-10-CM | POA: Diagnosis not present

## 2016-02-15 LAB — POCT I-STAT CREATININE: CREATININE: 0.8 mg/dL (ref 0.44–1.00)

## 2016-02-15 MED ORDER — GADOBENATE DIMEGLUMINE 529 MG/ML IV SOLN
15.0000 mL | Freq: Once | INTRAVENOUS | Status: AC | PRN
  Administered 2016-02-15: 12 mL via INTRAVENOUS

## 2016-02-26 DIAGNOSIS — L259 Unspecified contact dermatitis, unspecified cause: Secondary | ICD-10-CM | POA: Diagnosis not present

## 2016-03-11 DIAGNOSIS — M19072 Primary osteoarthritis, left ankle and foot: Secondary | ICD-10-CM | POA: Diagnosis not present

## 2016-03-11 DIAGNOSIS — M19071 Primary osteoarthritis, right ankle and foot: Secondary | ICD-10-CM | POA: Diagnosis not present

## 2016-03-11 DIAGNOSIS — L97511 Non-pressure chronic ulcer of other part of right foot limited to breakdown of skin: Secondary | ICD-10-CM | POA: Diagnosis not present

## 2016-03-11 DIAGNOSIS — L97521 Non-pressure chronic ulcer of other part of left foot limited to breakdown of skin: Secondary | ICD-10-CM | POA: Diagnosis not present

## 2016-03-31 DIAGNOSIS — D3000 Benign neoplasm of unspecified kidney: Secondary | ICD-10-CM | POA: Diagnosis not present

## 2016-04-22 DIAGNOSIS — L84 Corns and callosities: Secondary | ICD-10-CM | POA: Diagnosis not present

## 2016-04-22 DIAGNOSIS — L97511 Non-pressure chronic ulcer of other part of right foot limited to breakdown of skin: Secondary | ICD-10-CM | POA: Diagnosis not present

## 2016-04-22 DIAGNOSIS — I739 Peripheral vascular disease, unspecified: Secondary | ICD-10-CM | POA: Diagnosis not present

## 2016-04-22 DIAGNOSIS — L97521 Non-pressure chronic ulcer of other part of left foot limited to breakdown of skin: Secondary | ICD-10-CM | POA: Diagnosis not present

## 2016-05-06 ENCOUNTER — Other Ambulatory Visit: Payer: Self-pay | Admitting: Family Medicine

## 2016-05-06 DIAGNOSIS — Z1231 Encounter for screening mammogram for malignant neoplasm of breast: Secondary | ICD-10-CM

## 2016-05-08 DIAGNOSIS — C44311 Basal cell carcinoma of skin of nose: Secondary | ICD-10-CM | POA: Diagnosis not present

## 2016-05-08 DIAGNOSIS — L821 Other seborrheic keratosis: Secondary | ICD-10-CM | POA: Diagnosis not present

## 2016-05-08 DIAGNOSIS — Z85828 Personal history of other malignant neoplasm of skin: Secondary | ICD-10-CM | POA: Diagnosis not present

## 2016-05-08 DIAGNOSIS — L720 Epidermal cyst: Secondary | ICD-10-CM | POA: Diagnosis not present

## 2016-05-08 DIAGNOSIS — D485 Neoplasm of uncertain behavior of skin: Secondary | ICD-10-CM | POA: Diagnosis not present

## 2016-05-13 DIAGNOSIS — M79671 Pain in right foot: Secondary | ICD-10-CM | POA: Diagnosis not present

## 2016-05-13 DIAGNOSIS — M0579 Rheumatoid arthritis with rheumatoid factor of multiple sites without organ or systems involvement: Secondary | ICD-10-CM | POA: Diagnosis not present

## 2016-05-13 DIAGNOSIS — Z79899 Other long term (current) drug therapy: Secondary | ICD-10-CM | POA: Diagnosis not present

## 2016-05-13 DIAGNOSIS — M79672 Pain in left foot: Secondary | ICD-10-CM | POA: Diagnosis not present

## 2016-05-27 DIAGNOSIS — L97521 Non-pressure chronic ulcer of other part of left foot limited to breakdown of skin: Secondary | ICD-10-CM | POA: Diagnosis not present

## 2016-05-27 DIAGNOSIS — L97511 Non-pressure chronic ulcer of other part of right foot limited to breakdown of skin: Secondary | ICD-10-CM | POA: Diagnosis not present

## 2016-06-03 DIAGNOSIS — M0579 Rheumatoid arthritis with rheumatoid factor of multiple sites without organ or systems involvement: Secondary | ICD-10-CM | POA: Diagnosis not present

## 2016-06-03 DIAGNOSIS — M25552 Pain in left hip: Secondary | ICD-10-CM | POA: Diagnosis not present

## 2016-06-03 DIAGNOSIS — M25551 Pain in right hip: Secondary | ICD-10-CM | POA: Diagnosis not present

## 2016-06-26 ENCOUNTER — Ambulatory Visit
Admission: RE | Admit: 2016-06-26 | Discharge: 2016-06-26 | Disposition: A | Discharge status: Home / Self Care | Payer: Medicare Other | Source: Ambulatory Visit | Attending: Family Medicine | Admitting: Family Medicine

## 2016-06-26 DIAGNOSIS — Z1231 Encounter for screening mammogram for malignant neoplasm of breast: Secondary | ICD-10-CM

## 2016-06-30 DIAGNOSIS — Z85828 Personal history of other malignant neoplasm of skin: Secondary | ICD-10-CM | POA: Diagnosis not present

## 2016-06-30 DIAGNOSIS — C44311 Basal cell carcinoma of skin of nose: Secondary | ICD-10-CM | POA: Diagnosis not present

## 2016-07-04 DIAGNOSIS — L918 Other hypertrophic disorders of the skin: Secondary | ICD-10-CM | POA: Diagnosis not present

## 2016-07-04 DIAGNOSIS — L570 Actinic keratosis: Secondary | ICD-10-CM | POA: Diagnosis not present

## 2016-07-04 DIAGNOSIS — D2261 Melanocytic nevi of right upper limb, including shoulder: Secondary | ICD-10-CM | POA: Diagnosis not present

## 2016-07-04 DIAGNOSIS — D2262 Melanocytic nevi of left upper limb, including shoulder: Secondary | ICD-10-CM | POA: Diagnosis not present

## 2016-07-04 DIAGNOSIS — Z85828 Personal history of other malignant neoplasm of skin: Secondary | ICD-10-CM | POA: Diagnosis not present

## 2016-07-04 DIAGNOSIS — D1801 Hemangioma of skin and subcutaneous tissue: Secondary | ICD-10-CM | POA: Diagnosis not present

## 2016-07-04 DIAGNOSIS — L821 Other seborrheic keratosis: Secondary | ICD-10-CM | POA: Diagnosis not present

## 2016-07-04 DIAGNOSIS — L814 Other melanin hyperpigmentation: Secondary | ICD-10-CM | POA: Diagnosis not present

## 2016-07-08 DIAGNOSIS — L97521 Non-pressure chronic ulcer of other part of left foot limited to breakdown of skin: Secondary | ICD-10-CM | POA: Diagnosis not present

## 2016-07-08 DIAGNOSIS — L97511 Non-pressure chronic ulcer of other part of right foot limited to breakdown of skin: Secondary | ICD-10-CM | POA: Diagnosis not present

## 2016-07-09 DIAGNOSIS — E039 Hypothyroidism, unspecified: Secondary | ICD-10-CM | POA: Diagnosis not present

## 2016-07-09 DIAGNOSIS — F419 Anxiety disorder, unspecified: Secondary | ICD-10-CM | POA: Diagnosis not present

## 2016-07-09 DIAGNOSIS — R7309 Other abnormal glucose: Secondary | ICD-10-CM | POA: Diagnosis not present

## 2016-07-09 DIAGNOSIS — Z23 Encounter for immunization: Secondary | ICD-10-CM | POA: Diagnosis not present

## 2016-07-09 DIAGNOSIS — I10 Essential (primary) hypertension: Secondary | ICD-10-CM | POA: Diagnosis not present

## 2016-07-09 DIAGNOSIS — M810 Age-related osteoporosis without current pathological fracture: Secondary | ICD-10-CM | POA: Diagnosis not present

## 2016-07-09 DIAGNOSIS — E7800 Pure hypercholesterolemia, unspecified: Secondary | ICD-10-CM | POA: Diagnosis not present

## 2016-07-09 DIAGNOSIS — Z0000 Encounter for general adult medical examination without abnormal findings: Secondary | ICD-10-CM | POA: Diagnosis not present

## 2016-07-28 DIAGNOSIS — M545 Low back pain: Secondary | ICD-10-CM | POA: Diagnosis not present

## 2016-07-28 DIAGNOSIS — M5136 Other intervertebral disc degeneration, lumbar region: Secondary | ICD-10-CM | POA: Diagnosis not present

## 2016-07-28 DIAGNOSIS — M4126 Other idiopathic scoliosis, lumbar region: Secondary | ICD-10-CM | POA: Diagnosis not present

## 2016-07-28 DIAGNOSIS — M5127 Other intervertebral disc displacement, lumbosacral region: Secondary | ICD-10-CM | POA: Diagnosis not present

## 2016-08-05 DIAGNOSIS — L97511 Non-pressure chronic ulcer of other part of right foot limited to breakdown of skin: Secondary | ICD-10-CM | POA: Diagnosis not present

## 2016-08-05 DIAGNOSIS — L97521 Non-pressure chronic ulcer of other part of left foot limited to breakdown of skin: Secondary | ICD-10-CM | POA: Diagnosis not present

## 2016-08-19 DIAGNOSIS — M810 Age-related osteoporosis without current pathological fracture: Secondary | ICD-10-CM | POA: Diagnosis not present

## 2016-08-20 DIAGNOSIS — M79672 Pain in left foot: Secondary | ICD-10-CM | POA: Diagnosis not present

## 2016-08-20 DIAGNOSIS — M79671 Pain in right foot: Secondary | ICD-10-CM | POA: Diagnosis not present

## 2016-08-20 DIAGNOSIS — M0579 Rheumatoid arthritis with rheumatoid factor of multiple sites without organ or systems involvement: Secondary | ICD-10-CM | POA: Diagnosis not present

## 2016-08-20 DIAGNOSIS — Z79899 Other long term (current) drug therapy: Secondary | ICD-10-CM | POA: Diagnosis not present

## 2016-09-03 DIAGNOSIS — M810 Age-related osteoporosis without current pathological fracture: Secondary | ICD-10-CM | POA: Diagnosis not present

## 2016-09-09 DIAGNOSIS — L97521 Non-pressure chronic ulcer of other part of left foot limited to breakdown of skin: Secondary | ICD-10-CM | POA: Diagnosis not present

## 2016-09-09 DIAGNOSIS — L97511 Non-pressure chronic ulcer of other part of right foot limited to breakdown of skin: Secondary | ICD-10-CM | POA: Diagnosis not present

## 2016-10-14 DIAGNOSIS — L97511 Non-pressure chronic ulcer of other part of right foot limited to breakdown of skin: Secondary | ICD-10-CM | POA: Diagnosis not present

## 2016-10-14 DIAGNOSIS — L97521 Non-pressure chronic ulcer of other part of left foot limited to breakdown of skin: Secondary | ICD-10-CM | POA: Diagnosis not present

## 2016-11-18 DIAGNOSIS — I739 Peripheral vascular disease, unspecified: Secondary | ICD-10-CM | POA: Diagnosis not present

## 2016-11-18 DIAGNOSIS — L84 Corns and callosities: Secondary | ICD-10-CM | POA: Diagnosis not present

## 2016-11-18 DIAGNOSIS — L97521 Non-pressure chronic ulcer of other part of left foot limited to breakdown of skin: Secondary | ICD-10-CM | POA: Diagnosis not present

## 2016-11-19 DIAGNOSIS — J209 Acute bronchitis, unspecified: Secondary | ICD-10-CM | POA: Diagnosis not present

## 2016-11-27 DIAGNOSIS — M2550 Pain in unspecified joint: Secondary | ICD-10-CM | POA: Diagnosis not present

## 2016-11-27 DIAGNOSIS — M0579 Rheumatoid arthritis with rheumatoid factor of multiple sites without organ or systems involvement: Secondary | ICD-10-CM | POA: Diagnosis not present

## 2016-11-27 DIAGNOSIS — Z6824 Body mass index (BMI) 24.0-24.9, adult: Secondary | ICD-10-CM | POA: Diagnosis not present

## 2016-11-27 DIAGNOSIS — Z79899 Other long term (current) drug therapy: Secondary | ICD-10-CM | POA: Diagnosis not present

## 2016-12-16 DIAGNOSIS — I739 Peripheral vascular disease, unspecified: Secondary | ICD-10-CM | POA: Diagnosis not present

## 2016-12-16 DIAGNOSIS — L84 Corns and callosities: Secondary | ICD-10-CM | POA: Diagnosis not present

## 2016-12-19 DIAGNOSIS — D485 Neoplasm of uncertain behavior of skin: Secondary | ICD-10-CM | POA: Diagnosis not present

## 2016-12-19 DIAGNOSIS — Z85828 Personal history of other malignant neoplasm of skin: Secondary | ICD-10-CM | POA: Diagnosis not present

## 2016-12-19 DIAGNOSIS — C44311 Basal cell carcinoma of skin of nose: Secondary | ICD-10-CM | POA: Diagnosis not present

## 2016-12-19 DIAGNOSIS — C44319 Basal cell carcinoma of skin of other parts of face: Secondary | ICD-10-CM | POA: Diagnosis not present

## 2016-12-19 DIAGNOSIS — L814 Other melanin hyperpigmentation: Secondary | ICD-10-CM | POA: Diagnosis not present

## 2016-12-19 DIAGNOSIS — L570 Actinic keratosis: Secondary | ICD-10-CM | POA: Diagnosis not present

## 2017-01-01 DIAGNOSIS — H52203 Unspecified astigmatism, bilateral: Secondary | ICD-10-CM | POA: Diagnosis not present

## 2017-01-01 DIAGNOSIS — Z961 Presence of intraocular lens: Secondary | ICD-10-CM | POA: Diagnosis not present

## 2017-01-01 DIAGNOSIS — H524 Presbyopia: Secondary | ICD-10-CM | POA: Diagnosis not present

## 2017-01-08 DIAGNOSIS — E039 Hypothyroidism, unspecified: Secondary | ICD-10-CM | POA: Diagnosis not present

## 2017-01-08 DIAGNOSIS — E7800 Pure hypercholesterolemia, unspecified: Secondary | ICD-10-CM | POA: Diagnosis not present

## 2017-01-08 DIAGNOSIS — I10 Essential (primary) hypertension: Secondary | ICD-10-CM | POA: Diagnosis not present

## 2017-01-08 DIAGNOSIS — R7303 Prediabetes: Secondary | ICD-10-CM | POA: Diagnosis not present

## 2017-01-08 DIAGNOSIS — F419 Anxiety disorder, unspecified: Secondary | ICD-10-CM | POA: Diagnosis not present

## 2017-01-15 ENCOUNTER — Ambulatory Visit: Payer: Medicare Other | Admitting: Physical Therapy

## 2017-01-15 DIAGNOSIS — C44311 Basal cell carcinoma of skin of nose: Secondary | ICD-10-CM | POA: Diagnosis not present

## 2017-01-15 DIAGNOSIS — L570 Actinic keratosis: Secondary | ICD-10-CM | POA: Diagnosis not present

## 2017-01-15 DIAGNOSIS — Z85828 Personal history of other malignant neoplasm of skin: Secondary | ICD-10-CM | POA: Diagnosis not present

## 2017-01-20 DIAGNOSIS — L84 Corns and callosities: Secondary | ICD-10-CM | POA: Diagnosis not present

## 2017-01-20 DIAGNOSIS — I739 Peripheral vascular disease, unspecified: Secondary | ICD-10-CM | POA: Diagnosis not present

## 2017-01-21 ENCOUNTER — Ambulatory Visit: Payer: Medicare Other | Attending: Specialist | Admitting: Physical Therapy

## 2017-01-21 ENCOUNTER — Encounter: Payer: Self-pay | Admitting: Physical Therapy

## 2017-01-21 DIAGNOSIS — G8929 Other chronic pain: Secondary | ICD-10-CM | POA: Insufficient documentation

## 2017-01-21 DIAGNOSIS — M4126 Other idiopathic scoliosis, lumbar region: Secondary | ICD-10-CM | POA: Insufficient documentation

## 2017-01-21 DIAGNOSIS — M5441 Lumbago with sciatica, right side: Secondary | ICD-10-CM | POA: Insufficient documentation

## 2017-01-21 DIAGNOSIS — M5442 Lumbago with sciatica, left side: Secondary | ICD-10-CM | POA: Diagnosis not present

## 2017-01-21 DIAGNOSIS — M6281 Muscle weakness (generalized): Secondary | ICD-10-CM

## 2017-01-21 NOTE — Patient Instructions (Addendum)
Side Stretch    With feet shoulder width apart, hands on hips, inhale. Then exhale while bending directly to side, keeping top arm outstretched. Hold 1 count. Slowly return to starting position. Repeat to other side. Repeat __3__ times, each side.  Copyright  VHI. All rights reserved.  Head Press With [employer] chin and forehead . Feel weight on back of head. Increase weight on head by pressing head down. Hold _5__ seconds. Relax. Repeat _3__ times. r   Copyright  VHI. All rights reserved.  Shoulder Press    Press both shoulders down. Hold _5__ seconds. Repeat _3__ times. . Do other shoulder. If unable to press one or both shoulders, lie in position a few sessions until you can.   Copyright  VHI. All rights reserved.  Leg Lengthener: Full    Straighten one leg. Pull toes AND forefoot toward knee, extend heel. Lengthen leg by pulling pelvis away from ribs. Hold _5__ seconds. Relax. Repeat 1 time. Re-bend knee. Do other leg. Each leg _3__ times. Make yourself long   Copyright  VHI. All rights reserved.  Leg Straightener / Heel Extender    Straighten one leg down. Pull toes AND forefoot toward knee, extend heel. Hold foot position _5__ seconds. Relax the foot. Repeat 1 time. Re-bend knee. Do other leg. Each leg __3_ times.   Squeeze buttocks and thigh. Copyright  VHI. All rights reserved.                                             DO's and DON'T's   Avoid and/or Minimize positions of forward bending ( flexion)  Side bending and rotation of the trunk  Especially when movements occur together   When your back aches:   Don't sit down   Lie down on your back with a small pillow under your head and one under your knees or as outlined by our therapist. Or, lie in the 90/90 position ( on the floor with your feet and legs on the sofa with knees and hips bent to 90 degrees)  Tying or putting on your shoes:   Don't bend over to tie your shoes or put on  socks.  Instead, bring one foot up, cross it over the opposite knee and bend forward (hinge) at the hips to so the task.  Keep your back straight.  If you cannot do this safely, then you need to use long handled assistive devices such as a shoehorn and sock puller.  Exercising:  Don't engage in ballistic types of exercise routines such as high-impact aerobics or jumping rope  Don't do exercises in the gym that bring you forward (abdominal crunches, sit-ups, touching your  toes, knee-to-chest, straight leg raising.)  Follow a regular exercise program that includes a variety of different weight-bearing activities, such as low-impact aerobics, T' ai chi or walking as your physical therapist advises  Do exercises that emphasize return to normal body alignment and strengthening of the muscles that keep your back straight, as outlined in this program or by your therapist  Household tasks:  Don't reach unnecessarily or twist your trunk when mopping, sweeping, vacuuming, raking, making beds, weeding gardens, getting objects ou of cupboards, etc.  Keep your broom, mop, vacuum, or rake close to you and mover your whole body as you move them. Walk over to the area on which  you are working. Arrange kitchen, bathroom, and bedroom shelves so that frequently used items may be reached without excessive bending, twisting, and reaching.  Use a sturdy stool if necessary.  Don't bend from the waist to pick up something up  Off the floor, out of the trunk of your car, or to brush your teeth, wash your face, etc.   Bend at the knees, keeping back straight as possible. Use a reacher if necessary.   Prevention of fracture is the so-called "BOTTOm -Line" in the management of OSTEOPOROSIS. Do not take unnecessary chances in movement. Once a compression fracture occurs, the process is very difficult to control; one fracture is frequently followed by many more.  [name] [address] [place], [place] [address] Phone # [phone] Fax [phone]

## 2017-01-21 NOTE — Therapy (Signed)
[facility] [address] [place], [place], [address] Phone: [phone]   Fax:  [phone]  Physical Therapy Evaluation  Patient Details  Name: [name] MRN: [id number] Date of Birth: 1933-11-16 Referring Provider: Dr. Susa Day   Encounter Date: 01/21/2017      PT End of Session - 01/21/17 1025    Visit Number 1   Number of Visits 10   Date for PT Re-Evaluation 03/18/17   Authorization Type medicare g-code 10th visit; KX modifier 15th visit   PT Start Time 0930   PT Stop Time 1020   PT Time Calculation (min) 50 min   Activity Tolerance Patient tolerated treatment well   Behavior During Therapy [facility] for tasks assessed/performed      Past Medical History:  Diagnosis Date  . Abnormal cardiovascular stress test 12/2013   s/p cath with mild nonobstructive CAD normal EF - managed medically  . Breast cancer ([place]) 2008   RT, chemo, lumpectomy  . Diverticulitis   . DVT (deep vein thrombosis) in pregnancy [facility])    site of PICC catheter  . HTN (hypertension)   . Hyperlipidemia   . Hypothyroidism   . Kidney tumor 2010   RFA  . Prediabetes   . Rheumatoid arthritis(714.0)   . UTI (lower urinary tract infection)     Past Surgical History:  Procedure Laterality Date  . ABDOMINAL SURGERY     for ruptured diverticuli  . bilateral foot surgery    . BREAST LUMPECTOMY    . CARDIAC CATHETERIZATION    . CERVICAL SPINE SURGERY    . LEFT HEART CATHETERIZATION WITH CORONARY ANGIOGRAM N/A 01/17/2014   Procedure: LEFT HEART CATHETERIZATION WITH CORONARY ANGIOGRAM;  Surgeon: Peter M Martinique, MD;  Location: [facility] CATH LAB;  Service: Cardiovascular;  Laterality: N/A;  . OVARIAN CYST REMOVAL    . perforation of colon    . renal RFA    . take down of colonostomy  2011  . TONSILLECTOMY      There were no vitals filed for this visit.       Subjective Assessment - 01/21/17 0943    Subjective Patient reports 6 months  ago pain in low back to thoracic, pins and needles in low back, pain in thighs with weakness especially when get up in morning, and chronic neck pian.  Pain in bilateral shoulders.    How long can you sit comfortably? 1 hour increased pain   How long can you stand comfortably? 30 min in church   How long can you walk comfortably? not increase pain   Patient Stated Goals stronger muscles and less pain   Currently in Pain? Yes   Pain Score 8    Pain Location Back   Pain Orientation Right;Left;Lower   Pain Descriptors / Indicators Dull;Sharp;Pins and needles   Pain Type Chronic pain   Pain Radiating Towards down legs   Pain Onset More than a month ago   Pain Frequency Intermittent   Aggravating Factors  sitting, standing, washing kitchen floor, bending over, working outside   Pain Relieving Factors heat   Multiple Pain Sites Yes   Pain Score 5   Pain Location Leg   Pain Orientation Right;Left;Upper   Pain Descriptors / Indicators Tightness;Dull   Pain Type Chronic pain   Pain Onset More than a month ago   Pain Frequency Intermittent   Aggravating Factors  worse in mornings and legs feel like they are not going to hold  her up   Pain Relieving Factors movement            [facility] PT Assessment - 01/21/17 0001      Assessment   Medical Diagnosis M51.36 Lumbar DDD: M41.26 Other idiopathic scoliosis   Referring Provider Dr. Susa Day    Onset Date/Surgical Date 08/13/16   Prior Therapy Yes in 01/2015     Precautions   Precautions Other (comment)   Precaution Comments osteoporosis; breast cancer     Restrictions   Weight Bearing Restrictions No     Balance Screen   Has the patient fallen in the past 6 months No   Has the patient had a decrease in activity level because of a fear of falling?  No   Is the patient reluctant to leave their home because of a fear of falling?  No     Home Ec[employer] residence     Prior Function   Level of  Independence Independent   Vocation Retired   Leisure outside work; walk daily 1/2 mile     Cognition   Overall Cognitive Status Within Functional Limits for tasks assessed     Observation/Other Assessments   Focus on Therapeutic Outcomes (FOTO)  59% limitation  goal is 47% limitation     Posture/Postural Control   Posture/Postural Control Postural limitations   Postural Limitations Increased thoracic kyphosis;Decreased lumbar lordosis;Forward head;Rounded Shoulders   Posture Comments concave thoracic curve to right, lumbar concave curve to the left     ROM / Strength   AROM / PROM / Strength AROM;PROM;Strength     AROM   Overall AROM Comments lumbar extension, and bil. sidebending decreased by 75%     PROM   Right Hip External Rotation  30   Left Hip Flexion 105   Left Hip External Rotation  45     Strength   Right Hip Flexion 3+/5   Right Hip External Rotation  4/5   Right Hip Internal Rotation 3/5   Right Hip ABduction 3+/5   Left Hip Flexion 4/5   Right Knee Flexion 3/5   Right Knee Extension 3+/5   Left Knee Flexion 4-/5   Left Knee Extension 5/5     Palpation   Palpation comment tightness in skin/fascia on lumbar area; decreased mobility of right lateral rib cage; tightness located in bil. quadratus and lumbar paraspinals                           PT Education - 01/21/17 1024    Education provided Yes   Education Details stretches, decompression of spine, information on what not to do with osteoporosis   Person(s) Educated Patient   Methods Explanation;Demonstration;Verbal cues;Handout   Comprehension Returned demonstration;Verbalized understanding          PT Short Term Goals - 01/21/17 1037      PT SHORT TERM GOAL #1   Title independent with initial HEP   Time 4   Period Weeks   Status New     PT SHORT TERM GOAL #2   Title understand what postures to avoid to decrease spinal fractures.   Time 4   Period Weeks   Status New      PT SHORT TERM GOAL #3   Title lumbar pain reduced >/= 25% due to improved posture and core strength   Time 4   Period Weeks   Status New     PT  SHORT TERM GOAL #4   Title understand correct body mechanics to decreased strain on the lumbar spine   Time 4   Period Weeks   Status New           PT Long Term Goals - 01/21/17 1039      PT LONG TERM GOAL #1   Title return demostation on correct lifting and perform daily activities with correct posture   Time 8   Period Weeks   Status New     PT LONG TERM GOAL #2   Title lumbar pain decreased >/= 50% due to understanding ways to manage pain   Time 8   Period Weeks   Status New     PT LONG TERM GOAL #3   Title understand home exercise program for trunk and extremity strengthening with correct postures   Time 8   Period Weeks   Status New     PT LONG TERM GOAL #4   Title FOTO score </= 47% limitation   Time 8   Period Weeks   Status New               Plan - 01/21/17 1026    Clinical Impression Statement Patient is a 83 year old female with scoliosis and back pain that is chronic. Patient reports intermittent pain in lumbar at level 8/10 and legs at level 5/10. Pain is worse with standing, sitting, and bending.  Patient wakes up in the morning with increased pain  and her legs feel they would not hold her.  Patient posture consists of forward head, rounded shoulders, increased thoracic kyphosis, decreased lumbar lordosis, concave curve in thoracic on the right and lumbar on the left, and right rib cage is compacted.  Bilateral hip ER is limted with decresaed left hip flexion.  Patient has weakness in bilateral legs with right worse than left. Decreased mobility of lumbar skin due to fascial tightness. Patient has tightness in lumbar and thoracic muscles.  Patient is moderate complexity due to evolving condition and comorbidities such as osteoporosis, s/p breast cancer, rhuematoid arthrits that will impact care provided.  Patient will benefit from skilled therapy to reduce pain , improve posture , improve mobility and education on osteoporosis.    Rehab Potential Excellent   Clinical Impairments Affecting Rehab Potential history of breast cancer; osteoporosis; scoliosis   PT Frequency 2x / week   PT Duration 8 weeks   PT Treatment/Interventions Cryotherapy;Electrical Stimulation;Moist Heat;Patient/family education;Neuromuscular re-education;Therapeutic exercise;Therapeutic activities;Manual techniques;Passive range of motion;Dry needling;Energy conservation;Taping   PT Next Visit Plan body mechanics with daily tasks; soft tissue work to lumbar and right lateral rib cage; LE strengthening; core strength   PT Home Exercise Plan body mechanics   Recommended Other Services None   Consulted and Agree with Plan of Care Patient      Patient will benefit from skilled therapeutic intervention in order to improve the following deficits and impairments:  Pain, Decreased mobility, Decreased strength, Decreased activity tolerance, Impaired flexibility, Increased muscle spasms, Increased fascial restricitons, Decreased range of motion  Visit Diagnosis: Other idiopathic scoliosis, lumbar region - Plan: PT plan of care cert/re-cert  Muscle weakness (generalized) - Plan: PT plan of care cert/re-cert  Chronic right-sided low back pain with bilateral sciatica - Plan: PT plan of care cert/re-cert      G-Codes - 65/78/46 1024    Functional Assessment Tool Used (Outpatient Only) FOTO score is 59% limitation  goal is 47% limitation   Functional Limitation Other PT primary  Other PT Primary Current Status [phone]) At least 40 percent but less than 60 percent impaired, limited or restricted   Other PT Primary Goal Status (P7106) At least 40 percent but less than 60 percent impaired, limited or restricted       Problem List Patient Active Problem List   Diagnosis Date Noted  . Dyspnea on exertion 12/30/2013  . Chest pain  on exertion 12/30/2013  . HTN (hypertension) 12/30/2013  . Hyperlipidemia 12/30/2013  . Rheumatoid arthritis(714.0)   . Hypothyroidism   . Breast cancer [facility])     Earlie Counts, PT 01/21/17 10:43 AM   [facility] [address], [place], [place], [address] Phone: [phone]   Fax:  [phone]  Name: ROSEALIE REACH MRN: [id number] Date of Birth: 20-Apr-1934

## 2017-01-23 ENCOUNTER — Encounter: Payer: Self-pay | Admitting: Physical Therapy

## 2017-01-23 ENCOUNTER — Ambulatory Visit: Payer: Medicare Other | Admitting: Physical Therapy

## 2017-01-23 DIAGNOSIS — M6281 Muscle weakness (generalized): Secondary | ICD-10-CM

## 2017-01-23 DIAGNOSIS — M5441 Lumbago with sciatica, right side: Secondary | ICD-10-CM | POA: Diagnosis not present

## 2017-01-23 DIAGNOSIS — M5442 Lumbago with sciatica, left side: Secondary | ICD-10-CM

## 2017-01-23 DIAGNOSIS — G8929 Other chronic pain: Secondary | ICD-10-CM | POA: Diagnosis not present

## 2017-01-23 DIAGNOSIS — M4126 Other idiopathic scoliosis, lumbar region: Secondary | ICD-10-CM

## 2017-01-23 NOTE — Therapy (Signed)
[facility] [address] [place], [place], [address] Phone: [phone]   Fax:  [phone]  Physical Therapy Treatment  Patient Details  Name: [name] MRN: [id number] Date of Birth: [birth year] Referring Provider: Dr. Susa Day   Encounter Date: 01/23/2017      PT End of Session - 01/23/17 0911    Visit Number 2   Number of Visits 10   Date for PT Re-Evaluation 03/18/17   Authorization Type medicare g-code 10th visit; KX modifier 15th visit   PT Start Time 0845   PT Stop Time 0925   PT Time Calculation (min) 40 min   Activity Tolerance Patient tolerated treatment well   Behavior During Therapy [facility] for tasks assessed/performed      Past Medical History:  Diagnosis Date  . Abnormal cardiovascular stress test 12/2013   s/p cath with mild nonobstructive CAD normal EF - managed medically  . Breast cancer ([place]) 2008   RT, chemo, lumpectomy  . Diverticulitis   . DVT (deep vein thrombosis) in pregnancy [facility])    site of PICC catheter  . HTN (hypertension)   . Hyperlipidemia   . Hypothyroidism   . Kidney tumor 2010   RFA  . Prediabetes   . Rheumatoid arthritis(714.0)   . UTI (lower urinary tract infection)     Past Surgical History:  Procedure Laterality Date  . ABDOMINAL SURGERY     for ruptured diverticuli  . bilateral foot surgery    . BREAST LUMPECTOMY    . CARDIAC CATHETERIZATION    . CERVICAL SPINE SURGERY    . LEFT HEART CATHETERIZATION WITH CORONARY ANGIOGRAM N/A 01/17/2014   Procedure: LEFT HEART CATHETERIZATION WITH CORONARY ANGIOGRAM;  Surgeon: Peter M Martinique, MD;  Location: [facility] CATH LAB;  Service: Cardiovascular;  Laterality: N/A;  . OVARIAN CYST REMOVAL    . perforation of colon    . renal RFA    . take down of colonostomy  2011  . TONSILLECTOMY      There were no vitals filed for this visit.      Subjective Assessment - 01/23/17 0847    Subjective I hurt after the evaluation    How long can you sit comfortably? 1 hour increased pain   How long can you stand comfortably? 30 min in church   How long can you walk comfortably? not increase pain   Patient Stated Goals stronger muscles and less pain   Currently in Pain? Yes   Pain Score 3    Pain Location Back   Pain Orientation Right;Left;Lower   Pain Descriptors / Indicators Dull;Sharp;Pins and needles   Pain Type Chronic pain   Pain Radiating Towards down right leg   Pain Onset More than a month ago   Pain Frequency Intermittent   Aggravating Factors  sitting, standing, washing kitchen floor, bending over, working outside   Pain Relieving Factors heat   Multiple Pain Sites No                         [facility] Adult PT Treatment/Exercise - 01/23/17 0001      Manual Therapy   Manual Therapy Soft tissue mobilization   Soft tissue mobilization right quadratus, right lumbar and thoracic paraspinals, and right lateral rib cage                PT Education - 01/23/17 0911    Education provided Yes   Education Details hip  and knee strengthening   Person(s) Educated Patient   Methods Explanation;Demonstration;Verbal cues;Handout   Comprehension Verbalized understanding;Returned demonstration          PT Short Term Goals - 01/21/17 1037      PT SHORT TERM GOAL #1   Title independent with initial HEP   Time 4   Period Weeks   Status New     PT SHORT TERM GOAL #2   Title understand what postures to avoid to decrease spinal fractures.   Time 4   Period Weeks   Status New     PT SHORT TERM GOAL #3   Title lumbar pain reduced >/= 25% due to improved posture and core strength   Time 4   Period Weeks   Status New     PT SHORT TERM GOAL #4   Title understand correct body mechanics to decreased strain on the lumbar spine   Time 4   Period Weeks   Status New           PT Long Term Goals - 01/21/17 1039      PT LONG TERM GOAL #1   Title return demostation on correct  lifting and perform daily activities with correct posture   Time 8   Period Weeks   Status New     PT LONG TERM GOAL #2   Title lumbar pain decreased >/= 50% due to understanding ways to manage pain   Time 8   Period Weeks   Status New     PT LONG TERM GOAL #3   Title understand home exercise program for trunk and extremity strengthening with correct postures   Time 8   Period Weeks   Status New     PT LONG TERM GOAL #4   Title FOTO score </= 47% limitation   Time 8   Period Weeks   Status New               Plan - 01/23/17 0850    Clinical Impression Statement Patient understands how to strengthen the legs with correct body mechanics.  Patient had less pain in right hip after soft tissue work.  Patient has increased tenderness located in the right quadratus.  Patient will benefit from skilled therapy to reduce pain, improve posture, improve mobility, and education on osteoporosis.    Rehab Potential Excellent   Clinical Impairments Affecting Rehab Potential history of breast cancer; osteoporosis; scoliosis   PT Frequency 2x / week   PT Duration 8 weeks   PT Treatment/Interventions Cryotherapy;Electrical Stimulation;Moist Heat;Patient/family education;Neuromuscular re-education;Therapeutic exercise;Therapeutic activities;Manual techniques;Passive range of motion;Dry needling;Energy conservation;Taping   PT Next Visit Plan body mechanics with daily tasks; soft tissue work to lumbar and right lateral rib cage; core strength   PT Home Exercise Plan body mechanics   Consulted and Agree with Plan of Care Patient      Patient will benefit from skilled therapeutic intervention in order to improve the following deficits and impairments:  Pain, Decreased mobility, Decreased strength, Decreased activity tolerance, Impaired flexibility, Increased muscle spasms, Increased fascial restricitons, Decreased range of motion  Visit Diagnosis: Other idiopathic scoliosis, lumbar  region  Muscle weakness (generalized)  Chronic right-sided low back pain with bilateral sciatica     Problem List Patient Active Problem List   Diagnosis Date Noted  . Dyspnea on exertion 12/30/2013  . Chest pain on exertion 12/30/2013  . HTN (hypertension) 12/30/2013  . Hyperlipidemia 12/30/2013  . Rheumatoid arthritis(714.0)   . Hypothyroidism   .  Breast cancer [facility])     Earlie Counts, PT 01/23/17 9:31 AM   [facility] [address] [place], [place], [address] Phone: [phone]   Fax:  [phone]  Name: [name] MRN: [id number] Date of Birth: 12-09-1933

## 2017-01-23 NOTE — Patient Instructions (Addendum)
FLEXION: Standing - Stable (Active)    Stand, both feet flat. Lift right knee toward ceiling. Hold onto counter. Complete _1__ sets of _10__ repetitions on each leg. Perform _1__ sessions per day. Tighten stomach and upright posture. No increase in back pain. http://gtsc.exer.us/29   Copyright  VHI. All rights reserved.  ABDUCTION: Standing (Active)    Stand, feet flat. Lift right leg out to side leading with heel. Complete _1__ sets of _10__ repetitions. Perform __1_ sessions per day. Stand up tall. Tighten abdominals.  Only do where no increase in back pain. http://gtsc.exer.us/111   Copyright  VHI. All rights reserved.  EXTENSION: Standing (Active)    Stand, both feet flat. Draw right leg behind body as far as possible. Do no move trunk.  Complete _10__ sets of _1__ repetitions. Perform _1__ sessions per day. No pain in back http://gtsc.exer.us/77   Copyright  VHI. All rights reserved.  Heel Raises    Stand with support. Tighten pelvic floor and hold. With knees straight, raise heels off ground. Hold _1__ seconds. Relax for _1__ seconds. Repeat _20__ times. Do _1__ times a day.  Copyright  VHI. All rights reserved.  EXTENSION: Sitting - Resistance Band (Active)    Sit with feet flat. Against yellow resistance band, straighten right knee. Complete _2__ sets of _10__ repetitions. Perform _1__ sessions per day.  Copyright  VHI. All rights reserved.  [facility] [address] [place], [place] [address] Phone # [phone] Fax [phone]

## 2017-01-26 ENCOUNTER — Ambulatory Visit: Payer: Medicare Other | Admitting: Physical Therapy

## 2017-01-26 ENCOUNTER — Encounter: Payer: Self-pay | Admitting: Physical Therapy

## 2017-01-26 DIAGNOSIS — M6281 Muscle weakness (generalized): Secondary | ICD-10-CM | POA: Diagnosis not present

## 2017-01-26 DIAGNOSIS — M5441 Lumbago with sciatica, right side: Secondary | ICD-10-CM | POA: Diagnosis not present

## 2017-01-26 DIAGNOSIS — M4126 Other idiopathic scoliosis, lumbar region: Secondary | ICD-10-CM

## 2017-01-26 DIAGNOSIS — M5442 Lumbago with sciatica, left side: Secondary | ICD-10-CM

## 2017-01-26 DIAGNOSIS — G8929 Other chronic pain: Secondary | ICD-10-CM

## 2017-01-26 NOTE — Therapy (Signed)
[facility] [address] [place], [place], [address] Phone: [phone]   Fax:  [phone]  Physical Therapy Treatment  Patient Details  Name: [name] MRN: [id number] Date of Birth: 29-Feb-1934 Referring Provider: Dr. Susa Day   Encounter Date: 01/26/2017      PT End of Session - 01/26/17 1311    Visit Number 3   Number of Visits 10   Date for PT Re-Evaluation 03/18/17   Authorization Type medicare g-code 10th visit; KX modifier 15th visit   PT Start Time 1220   PT Stop Time 1311   PT Time Calculation (min) 51 min   Activity Tolerance Patient tolerated treatment well   Behavior During Therapy [facility] for tasks assessed/performed      Past Medical History:  Diagnosis Date  . Abnormal cardiovascular stress test 12/2013   s/p cath with mild nonobstructive CAD normal EF - managed medically  . Breast cancer ([place]) 2008   RT, chemo, lumpectomy  . Diverticulitis   . DVT (deep vein thrombosis) in pregnancy [facility])    site of PICC catheter  . HTN (hypertension)   . Hyperlipidemia   . Hypothyroidism   . Kidney tumor 2010   RFA  . Prediabetes   . Rheumatoid arthritis(714.0)   . UTI (lower urinary tract infection)     Past Surgical History:  Procedure Laterality Date  . ABDOMINAL SURGERY     for ruptured diverticuli  . bilateral foot surgery    . BREAST LUMPECTOMY    . CARDIAC CATHETERIZATION    . CERVICAL SPINE SURGERY    . LEFT HEART CATHETERIZATION WITH CORONARY ANGIOGRAM N/A 01/17/2014   Procedure: LEFT HEART CATHETERIZATION WITH CORONARY ANGIOGRAM;  Surgeon: Peter M Martinique, MD;  Location: [facility] CATH LAB;  Service: Cardiovascular;  Laterality: N/A;  . OVARIAN CYST REMOVAL    . perforation of colon    . renal RFA    . take down of colonostomy  2011  . TONSILLECTOMY      There were no vitals filed for this visit.      Subjective Assessment - 01/26/17 1219    Subjective Having increased pain today,  possibly from home exercises.    How long can you sit comfortably? 1 hour increased pain   How long can you stand comfortably? 30 min in church   How long can you walk comfortably? not increase pain   Patient Stated Goals stronger muscles and less pain   Currently in Pain? Yes   Pain Score 7    Pain Location Back   Pain Orientation Right;Left;Lower   Pain Descriptors / Indicators Dull;Sharp;Pins and needles   Pain Type Chronic pain   Pain Radiating Towards Down right leg                         [facility] Adult PT Treatment/Exercise - 01/26/17 0001      Neuro Re-ed    Neuro Re-ed Details  Activation of TA and RA; postural reeducation     Exercises   Exercises Lumbar     Lumbar Exercises: Stretches   Active Hamstring Stretch 2 reps;10 seconds  Leg lengthening     Lumbar Exercises: Aerobic   Stationary Bike Nustep L1 x 6 minutes  Conccurent with discussion of treatment     Lumbar Exercises: Supine   Ab Set 10 reps   Glut Set 10 reps   Bent Knee Raise 10 reps  With  abdominal bracing   Other Supine Lumbar Exercises Hooklying extension  Horizontal abduction x10     Modalities   Modalities Moist Heat     Moist Heat Therapy   Number Minutes Moist Heat --  Conccurent with supine exercises   Moist Heat Location Lumbar Spine     Manual Therapy   Manual Therapy Soft tissue mobilization   Soft tissue mobilization right quadratus, right lumbar and thoracic paraspinals, and right lateral rib cage                PT Education - 01/26/17 1307    Education provided Yes   Education Details and shoulder strength   Person(s) Educated Patient   Methods Explanation;Demonstration;Handout   Comprehension Verbalized understanding          PT Short Term Goals - 01/26/17 1315      PT SHORT TERM GOAL #1   Title independent with initial HEP   Time 4   Period Weeks   Status On-going     PT SHORT TERM GOAL #2   Title understand what postures to avoid to  decrease spinal fractures.   Time 4   Period Weeks   Status On-going     PT SHORT TERM GOAL #3   Title lumbar pain reduced >/= 25% due to improved posture and core strength   Time 4   Period Weeks   Status On-going     PT SHORT TERM GOAL #4   Title understand correct body mechanics to decreased strain on the lumbar spine   Time 4   Period Weeks   Status On-going           PT Long Term Goals - 01/26/17 1315      PT LONG TERM GOAL #1   Title return demostation on correct lifting and perform daily activities with correct posture   Time 8   Period Weeks   Status On-going     PT LONG TERM GOAL #2   Title lumbar pain decreased >/= 50% due to understanding ways to manage pain   Time 8   Period Weeks   Status On-going     PT LONG TERM GOAL #3   Title understand home exercise program for trunk and extremity strengthening with correct postures   Time 8   Period Weeks   Status On-going     PT LONG TERM GOAL #4   Title FOTO score </= 47% limitation   Time 8   Period Weeks   Status On-going               Plan - 01/26/17 1312    Clinical Impression Statement Pt did well with all supine core strengthening exercsies with good abdominal contraction. Reviewed HEP with patient and modified hip abduction and extension into side step to reduce LE pain with exercise. Pt did well with side bending stretches and reports less hip pain since last session. Pt will continue to benefit from skilled therapy for core strength and stability.     Rehab Potential Excellent   Clinical Impairments Affecting Rehab Potential history of breast cancer; osteoporosis; scoliosis   PT Frequency 2x / week   PT Duration 8 weeks   PT Treatment/Interventions Cryotherapy;Electrical Stimulation;Moist Heat;Patient/family education;Neuromuscular re-education;Therapeutic exercise;Therapeutic activities;Manual techniques;Passive range of motion;Dry needling;Energy conservation;Taping   PT Next Visit Plan  side stretching, supine core strength, body mechanics   Consulted and Agree with Plan of Care Patient      Patient will benefit from skilled  therapeutic intervention in order to improve the following deficits and impairments:  Pain, Decreased mobility, Decreased strength, Decreased activity tolerance, Impaired flexibility, Increased muscle spasms, Increased fascial restricitons, Decreased range of motion  Visit Diagnosis: Other idiopathic scoliosis, lumbar region  Muscle weakness (generalized)  Chronic right-sided low back pain with bilateral sciatica     Problem List Patient Active Problem List   Diagnosis Date Noted  . Dyspnea on exertion 12/30/2013  . Chest pain on exertion 12/30/2013  . HTN (hypertension) 12/30/2013  . Hyperlipidemia 12/30/2013  . Rheumatoid arthritis(714.0)   . Hypothyroidism   . Breast cancer ([place])     Mikle Bosworth PTA 01/26/2017, 1:18 PM  [facility] [address] [place], [place], [address] Phone: [phone]   Fax:  [phone]  Name: KAILEA DANNEMILLER MRN: [id number] Date of Birth: [birth year]

## 2017-01-26 NOTE — Patient Instructions (Addendum)
Resisted Horizontal Abduction: Bilateral    Sit or stand, tubing in both hands, arms out in front. Keeping arms straight, pinch shoulder blades together and stretch arms out. Repeat __10__ times per set. Do __2__ sets per session. Do _1___ sessions per day.  http://orth.exer.us/969   Copyright  VHI. All rights reserved.   Hip Flexion - Supine    Lying on back, knees bent, feet on floor, bend 1 hip, bringing knee toward trunk. Repeat _10__ times. Do _1__ times per day.  Copyright  VHI. All rights reserved.   AMBULATION: Side Step    Step out to the side and then back to middle. Repeat in opposite direction. _10__ reps per set, _1__ sets per day.   Copyright  VHI. All rights reserved.   Mikle Bosworth, PTA 01/26/17 12:51 PM  [facility] [address] [place],  [address] Phone # [phone] Fax [phone]

## 2017-01-27 DIAGNOSIS — Z85828 Personal history of other malignant neoplasm of skin: Secondary | ICD-10-CM | POA: Diagnosis not present

## 2017-01-27 DIAGNOSIS — C44112 Basal cell carcinoma of skin of right eyelid, including canthus: Secondary | ICD-10-CM | POA: Diagnosis not present

## 2017-01-29 ENCOUNTER — Encounter: Payer: Medicare Other | Admitting: Physical Therapy

## 2017-02-02 ENCOUNTER — Encounter: Payer: Self-pay | Admitting: Physical Therapy

## 2017-02-02 ENCOUNTER — Ambulatory Visit: Payer: Medicare Other | Admitting: Physical Therapy

## 2017-02-02 DIAGNOSIS — M5442 Lumbago with sciatica, left side: Secondary | ICD-10-CM

## 2017-02-02 DIAGNOSIS — M6281 Muscle weakness (generalized): Secondary | ICD-10-CM | POA: Diagnosis not present

## 2017-02-02 DIAGNOSIS — I10 Essential (primary) hypertension: Secondary | ICD-10-CM | POA: Diagnosis not present

## 2017-02-02 DIAGNOSIS — G8929 Other chronic pain: Secondary | ICD-10-CM | POA: Diagnosis not present

## 2017-02-02 DIAGNOSIS — M5441 Lumbago with sciatica, right side: Secondary | ICD-10-CM | POA: Diagnosis not present

## 2017-02-02 DIAGNOSIS — R002 Palpitations: Secondary | ICD-10-CM | POA: Diagnosis not present

## 2017-02-02 DIAGNOSIS — M4126 Other idiopathic scoliosis, lumbar region: Secondary | ICD-10-CM | POA: Diagnosis not present

## 2017-02-02 NOTE — Therapy (Signed)
[facility] [address] [place], [place], [address] Phone: [phone]   Fax:  [phone]  Physical Therapy Treatment  Patient Details  Name: [name] MRN: [id number] Date of Birth: Jun 13, 1934 Referring Provider: Dr. Susa Day   Encounter Date: 02/02/2017      PT End of Session - 02/02/17 1059    Visit Number 4   Number of Visits 10   Date for PT Re-Evaluation 03/18/17   Authorization Type medicare g-code 10th visit; KX modifier 15th visit   PT Start Time 1051   PT Stop Time 1149   PT Time Calculation (min) 58 min   Activity Tolerance Patient tolerated treatment well   Behavior During Therapy [facility] for tasks assessed/performed      Past Medical History:  Diagnosis Date  . Abnormal cardiovascular stress test 12/2013   s/p cath with mild nonobstructive CAD normal EF - managed medically  . Breast cancer ([place]) 2008   RT, chemo, lumpectomy  . Diverticulitis   . DVT (deep vein thrombosis) in pregnancy [facility])    site of PICC catheter  . HTN (hypertension)   . Hyperlipidemia   . Hypothyroidism   . Kidney tumor 2010   RFA  . Prediabetes   . Rheumatoid arthritis(714.0)   . UTI (lower urinary tract infection)     Past Surgical History:  Procedure Laterality Date  . ABDOMINAL SURGERY     for ruptured diverticuli  . bilateral foot surgery    . BREAST LUMPECTOMY    . CARDIAC CATHETERIZATION    . CERVICAL SPINE SURGERY    . LEFT HEART CATHETERIZATION WITH CORONARY ANGIOGRAM N/A 01/17/2014   Procedure: LEFT HEART CATHETERIZATION WITH CORONARY ANGIOGRAM;  Surgeon: Peter M Martinique, MD;  Location: [facility] CATH LAB;  Service: Cardiovascular;  Laterality: N/A;  . OVARIAN CYST REMOVAL    . perforation of colon    . renal RFA    . take down of colonostomy  2011  . TONSILLECTOMY      There were no vitals filed for this visit.      Subjective Assessment - 02/02/17 1053    Subjective Pt reports having increased  pain after last session possibly from Nustep. Pt reports having pain down Lt arm in Rt leg. Pt mentioned her heart rate was sparatic and rapid last nigth and is gong to MD today for this.   How long can you sit comfortably? 1 hour increased pain   How long can you stand comfortably? 30 min in church   How long can you walk comfortably? not increase pain   Patient Stated Goals stronger muscles and less pain   Currently in Pain? Yes   Pain Score 6    Pain Location Back   Pain Orientation Right;Lower   Pain Descriptors / Indicators Dull   Pain Type Chronic pain   Pain Radiating Towards Down right leg   Pain Onset More than a month ago   Pain Frequency Intermittent   Aggravating Factors  sitting, standing, washing kitchen floor, bending over, working outside   Pain Relieving Factors heat   Pain Score 6   Pain Location Leg   Pain Orientation Right;Left;Upper   Pain Descriptors / Indicators Tightness;Dull   Pain Type Chronic pain   Pain Onset More than a month ago   Pain Frequency Intermittent  [place] Adult PT Treatment/Exercise - 02/02/17 0001      Lumbar Exercises: Stretches   Active Hamstring Stretch 2 reps;10 seconds  Leg lengthening     Lumbar Exercises: Supine   Ab Set 10 reps   Glut Set 10 reps   Bent Knee Raise 10 reps  With abdominal bracing   Large Ball Abdominal Isometric 10 reps  Small blue ball 1 arm at a time     Modalities   Modalities Moist Heat;Electrical Stimulation     Moist Heat Therapy   Number Minutes Moist Heat 15 Minutes   Moist Heat Location Lumbar Spine     Electrical Stimulation   Electrical Stimulation Location Rt lumbar spine, Rt hip   Electrical Stimulation Action Pre mod   Electrical Stimulation Parameters to tolerance 15 minutes   Electrical Stimulation Goals Pain     Manual Therapy   Manual Therapy Soft tissue mobilization   Manual therapy comments In Left sidelying   Soft tissue mobilization  right quadratus, right lumbar and thoracic paraspinals, right gluteals, obturator, and piriformis                  PT Short Term Goals - 02/02/17 1138      PT SHORT TERM GOAL #1   Title independent with initial HEP   Time 4   Period Weeks   Status Achieved     PT SHORT TERM GOAL #2   Title understand what postures to avoid to decrease spinal fractures.   Time 4   Period Weeks   Status On-going           PT Long Term Goals - 02/02/17 1138      PT LONG TERM GOAL #1   Title return demostation on correct lifting and perform daily activities with correct posture   Time 8   Period Weeks   Status On-going               Plan - 02/02/17 1135    Clinical Impression Statement Pt reports increased pain after last session. Pt needing to refrain from pulling exercises due to stitches in face. Pt did well with all core and glute activation exercises and stretches. Pt having increase tightness to Rt glutes and piriformis and obturator. Pt responded well to manual soft tissue mobilization and modalities. Pt will continue to benefit from skilled therapy for core strength and stability.    Rehab Potential Excellent   Clinical Impairments Affecting Rehab Potential history of breast cancer; osteoporosis; scoliosis   PT Frequency 2x / week   PT Duration 8 weeks   PT Treatment/Interventions Cryotherapy;Electrical Stimulation;Moist Heat;Patient/family education;Neuromuscular re-education;Therapeutic exercise;Therapeutic activities;Manual techniques;Passive range of motion;Dry needling;Energy conservation;Taping   PT Next Visit Plan Soft tissue work to Rt hip and piriformis, strengthening as tolerated   Consulted and Agree with Plan of Care Patient      Patient will benefit from skilled therapeutic intervention in order to improve the following deficits and impairments:  Pain, Decreased mobility, Decreased strength, Decreased activity tolerance, Impaired flexibility, Increased  muscle spasms, Increased fascial restricitons, Decreased range of motion  Visit Diagnosis: Other idiopathic scoliosis, lumbar region  Muscle weakness (generalized)  Chronic right-sided low back pain with bilateral sciatica     Problem List Patient Active Problem List   Diagnosis Date Noted  . Dyspnea on exertion 12/30/2013  . Chest pain on exertion 12/30/2013  . HTN (hypertension) 12/30/2013  . Hyperlipidemia 12/30/2013  . Rheumatoid arthritis(714.0)   . Hypothyroidism   .  Breast cancer ([place])     Mikle Bosworth PTA 02/02/2017, 12:14 PM  [facility] [address] [place], [place], [address] Phone: [phone]   Fax:  [phone]  Name: [name] MRN: [id number] Date of Birth: 1934-07-16

## 2017-02-05 ENCOUNTER — Encounter: Payer: Self-pay | Admitting: Physical Therapy

## 2017-02-05 ENCOUNTER — Ambulatory Visit: Payer: Medicare Other | Admitting: Physical Therapy

## 2017-02-05 DIAGNOSIS — M5441 Lumbago with sciatica, right side: Secondary | ICD-10-CM

## 2017-02-05 DIAGNOSIS — M6281 Muscle weakness (generalized): Secondary | ICD-10-CM

## 2017-02-05 DIAGNOSIS — M4126 Other idiopathic scoliosis, lumbar region: Secondary | ICD-10-CM

## 2017-02-05 DIAGNOSIS — M5442 Lumbago with sciatica, left side: Secondary | ICD-10-CM | POA: Diagnosis not present

## 2017-02-05 DIAGNOSIS — G8929 Other chronic pain: Secondary | ICD-10-CM

## 2017-02-05 NOTE — Therapy (Signed)
[facility] [address], [place], [place], [address] Phone: [phone]   Fax:  [phone]  Physical Therapy Treatment  Patient Details  Name: [name] MRN: [id number] Date of Birth: 01-Mar-1934 Referring Provider: Dr. Susa Day   Encounter Date: 02/05/2017      PT End of Session - 02/05/17 1009    Visit Number 5   Number of Visits 10   Date for PT Re-Evaluation 03/18/17   Authorization Type medicare g-code 10th visit; KX modifier 15th visit   PT Start Time 0930   PT Stop Time 1020   PT Time Calculation (min) 50 min   Activity Tolerance Patient tolerated treatment well   Behavior During Therapy [facility] for tasks assessed/performed      Past Medical History:  Diagnosis Date  . Abnormal cardiovascular stress test 12/2013   s/p cath with mild nonobstructive CAD normal EF - managed medically  . Breast cancer ([place]) 2008   RT, chemo, lumpectomy  . Diverticulitis   . DVT (deep vein thrombosis) in pregnancy [facility])    site of PICC catheter  . HTN (hypertension)   . Hyperlipidemia   . Hypothyroidism   . Kidney tumor 2010   RFA  . Prediabetes   . Rheumatoid arthritis(714.0)   . UTI (lower urinary tract infection)     Past Surgical History:  Procedure Laterality Date  . ABDOMINAL SURGERY     for ruptured diverticuli  . bilateral foot surgery    . BREAST LUMPECTOMY    . CARDIAC CATHETERIZATION    . CERVICAL SPINE SURGERY    . LEFT HEART CATHETERIZATION WITH CORONARY ANGIOGRAM N/A 01/17/2014   Procedure: LEFT HEART CATHETERIZATION WITH CORONARY ANGIOGRAM;  Surgeon: Peter M Martinique, MD;  Location: [facility] CATH LAB;  Service: Cardiovascular;  Laterality: N/A;  . OVARIAN CYST REMOVAL    . perforation of colon    . renal RFA    . take down of colonostomy  2011  . TONSILLECTOMY      There were no vitals filed for this visit.      Subjective Assessment - 02/05/17 0935    Subjective My back and hip are not good  due to treatment. I had an EKG and it was alright.  The massage helped.    How long can you sit comfortably? 1 hour increased pain   How long can you stand comfortably? 30 min in church   How long can you walk comfortably? not increase pain   Patient Stated Goals stronger muscles and less pain   Currently in Pain? Yes   Pain Score 9    Pain Location Back   Pain Orientation Right;Lower   Pain Descriptors / Indicators Dull   Pain Type Chronic pain   Pain Radiating Towards down right leg   Pain Onset More than a month ago   Pain Frequency Intermittent   Aggravating Factors  sitting, standing, washing kitchen floor, bending over, working outside   Pain Relieving Factors heat    Multiple Pain Sites No                         [facility] Adult PT Treatment/Exercise - 02/05/17 0001      Modalities   Modalities Electrical Stimulation;Moist Heat     Moist Heat Therapy   Number Minutes Moist Heat 15 Minutes   Moist Heat Location Lumbar Spine  left sidely     Electrical Stimulation  Electrical Stimulation Location lumbar spine  lfet sidely   Electrical Stimulation Action IFC   Electrical Stimulation Parameters 15 min, to patient tolerance   Electrical Stimulation Goals Pain     Manual Therapy   Manual Therapy Soft tissue mobilization   Soft tissue mobilization right quadratus, right thoracic lumbar paraspinals, right guteus medius, right ribcage intercoastals  left sidely                  PT Short Term Goals - 02/02/17 1138      PT SHORT TERM GOAL #1   Title independent with initial HEP   Time 4   Period Weeks   Status Achieved     PT SHORT TERM GOAL #2   Title understand what postures to avoid to decrease spinal fractures.   Time 4   Period Weeks   Status On-going           PT Long Term Goals - 02/02/17 1138      PT LONG TERM GOAL #1   Title return demostation on correct lifting and perform daily activities with correct posture   Time 8    Period Weeks   Status On-going               Plan - 02/05/17 1009    Clinical Impression Statement Patient has not met goals today due to pain level 9/10.  Focus today was pain reduction. Patient does not do well with the nustep. Patient feel better with gentle soft tissue work.  Patient will benefit from skilled therapy to reduce pain so she can start an exercise program.    Rehab Potential Excellent   Clinical Impairments Affecting Rehab Potential history of breast cancer; osteoporosis; scoliosis   PT Frequency 2x / week   PT Duration 8 weeks   PT Treatment/Interventions Cryotherapy;Electrical Stimulation;Moist Heat;Patient/family education;Neuromuscular re-education;Therapeutic exercise;Therapeutic activities;Manual techniques;Passive range of motion;Dry needling;Energy conservation;Taping   PT Next Visit Plan gentle Soft tissue work to Rt hip and piriformis, gentle strengthening as tolerated   PT Home Exercise Plan body mechanics   Consulted and Agree with Plan of Care Patient      Patient will benefit from skilled therapeutic intervention in order to improve the following deficits and impairments:  Pain, Decreased mobility, Decreased strength, Decreased activity tolerance, Impaired flexibility, Increased muscle spasms, Increased fascial restricitons, Decreased range of motion  Visit Diagnosis: Muscle weakness (generalized)  Other idiopathic scoliosis, lumbar region  Chronic right-sided low back pain with bilateral sciatica     Problem List Patient Active Problem List   Diagnosis Date Noted  . Dyspnea on exertion 12/30/2013  . Chest pain on exertion 12/30/2013  . HTN (hypertension) 12/30/2013  . Hyperlipidemia 12/30/2013  . Rheumatoid arthritis(714.0)   . Hypothyroidism   . Breast cancer [facility])     Earlie Counts, PT 02/05/17 10:13 AM   [facility] [address], [place], [place], [address] Phone:  [phone]   Fax:  [phone]  Name: VANNIA POLA MRN: [id number] Date of Birth: 11-Nov-1933

## 2017-02-09 ENCOUNTER — Ambulatory Visit: Payer: Medicare Other | Admitting: Physical Therapy

## 2017-02-09 ENCOUNTER — Encounter: Payer: Self-pay | Admitting: Physical Therapy

## 2017-02-09 DIAGNOSIS — M4126 Other idiopathic scoliosis, lumbar region: Secondary | ICD-10-CM

## 2017-02-09 DIAGNOSIS — M5441 Lumbago with sciatica, right side: Secondary | ICD-10-CM | POA: Diagnosis not present

## 2017-02-09 DIAGNOSIS — G8929 Other chronic pain: Secondary | ICD-10-CM

## 2017-02-09 DIAGNOSIS — M5442 Lumbago with sciatica, left side: Secondary | ICD-10-CM | POA: Diagnosis not present

## 2017-02-09 DIAGNOSIS — M6281 Muscle weakness (generalized): Secondary | ICD-10-CM

## 2017-02-09 NOTE — Therapy (Signed)
[facility] [address] [place], [place], [address] Phone: [phone]   Fax:  [phone]  Physical Therapy Treatment  Patient Details  Name: [name] MRN: [id number] Date of Birth: [birth year] Referring Provider: Dr. Susa Day   Encounter Date: 02/09/2017      PT End of Session - 02/09/17 0934    Visit Number 6   Number of Visits 10   Date for PT Re-Evaluation 03/18/17   Authorization Type medicare g-code 10th visit; KX modifier 15th visit   PT Start Time 0926   PT Stop Time 1010   PT Time Calculation (min) 44 min   Activity Tolerance Patient tolerated treatment well   Behavior During Therapy [facility] for tasks assessed/performed      Past Medical History:  Diagnosis Date  . Abnormal cardiovascular stress test 12/2013   s/p cath with mild nonobstructive CAD normal EF - managed medically  . Breast cancer ([place]) 2008   RT, chemo, lumpectomy  . Diverticulitis   . DVT (deep vein thrombosis) in pregnancy [facility])    site of PICC catheter  . HTN (hypertension)   . Hyperlipidemia   . Hypothyroidism   . Kidney tumor 2010   RFA  . Prediabetes   . Rheumatoid arthritis(714.0)   . UTI (lower urinary tract infection)     Past Surgical History:  Procedure Laterality Date  . ABDOMINAL SURGERY     for ruptured diverticuli  . bilateral foot surgery    . BREAST LUMPECTOMY    . CARDIAC CATHETERIZATION    . CERVICAL SPINE SURGERY    . LEFT HEART CATHETERIZATION WITH CORONARY ANGIOGRAM N/A 01/17/2014   Procedure: LEFT HEART CATHETERIZATION WITH CORONARY ANGIOGRAM;  Surgeon: Peter M Martinique, MD;  Location: [facility] CATH LAB;  Service: Cardiovascular;  Laterality: N/A;  . OVARIAN CYST REMOVAL    . perforation of colon    . renal RFA    . take down of colonostomy  2011  . TONSILLECTOMY      There were no vitals filed for this visit.      Subjective Assessment - 02/09/17 0932    Subjective I woke up around 5 am with  back and hip pain. Now the pain is less. I think I may be doing my exercises too late at night.    How long can you sit comfortably? 1 hour increased pain   How long can you stand comfortably? 30 min in church   How long can you walk comfortably? not increase pain   Patient Stated Goals stronger muscles and less pain   Currently in Pain? Yes   Pain Score 7    Pain Location Back   Pain Orientation Right;Lower   Pain Descriptors / Indicators Dull   Pain Type Chronic pain   Pain Radiating Towards down right leg   Pain Onset More than a month ago   Pain Frequency Intermittent   Aggravating Factors  sitting, standing, washing kitchen floor, beding over, working outside   Pain Relieving Factors heat   Multiple Pain Sites Yes   Pain Score 7   Pain Location Leg   Pain Orientation Right;Left;Upper   Pain Descriptors / Indicators Tightness;Dull   Pain Type Chronic pain   Pain Onset More than a month ago   Pain Frequency Intermittent   Aggravating Factors  worse in morning and legs feels like they are not going to hold her up   Pain Relieving Factors movement  [facility] Adult PT Treatment/Exercise - 02/09/17 0001      Lumbar Exercises: Stretches   Active Hamstring Stretch 2 reps;10 seconds  Leg lengthening     Modalities   Modalities Electrical Stimulation;Moist Heat     Moist Heat Therapy   Number Minutes Moist Heat 15 Minutes   Moist Heat Location Lumbar Spine  left sidely     Electrical Stimulation   Electrical Stimulation Location Rt lumbar spine and Rt glutes  lfet sidely   Electrical Stimulation Action Pre mod   Electrical Stimulation Parameters 15 minutes to patient tolerance   Electrical Stimulation Goals Pain     Manual Therapy   Manual Therapy Soft tissue mobilization   Manual therapy comments In Left sidelying   Soft tissue mobilization right quadratus, right thoracic lumbar paraspinals, right guteus medius, right ribcage  intercoastals  left sidely                  PT Short Term Goals - 02/09/17 0935      PT SHORT TERM GOAL #3   Title lumbar pain reduced >/= 25% due to improved posture and core strength   Time 4   Period Weeks   Status On-going     PT SHORT TERM GOAL #4   Title understand correct body mechanics to decreased strain on the lumbar spine   Time 4   Period Weeks   Status On-going           PT Long Term Goals - 02/09/17 1610      PT LONG TERM GOAL #1   Title return demostation on correct lifting and perform daily activities with correct posture   Time 8   Period Weeks   Status On-going     PT LONG TERM GOAL #2   Title lumbar pain decreased >/= 50% due to understanding ways to manage pain   Time 8   Period Weeks   Status On-going     PT LONG TERM GOAL #3   Title understand home exercise program for trunk and extremity strengthening with correct postures   Time 8   Period Weeks   Status On-going     PT LONG TERM GOAL #4   Title FOTO score </= 47% limitation   Time 8   Period Weeks   Status On-going               Plan - 02/09/17 1000    Clinical Impression Statement Patient reports continued pain in hip and low back, altough less today. Treatment focused again on maintaining tolerable pain levels. Patient having fewer tender places in hip with manual soft tissue mobilization. Continues to have tightness and tenderness in Rt glute at iliac crest. Patient responds well to Modalities and soft tissue mobilization. Patient will continue to benefit from skilled therapy for pain management and core stability.    Rehab Potential Excellent   Clinical Impairments Affecting Rehab Potential history of breast cancer; osteoporosis; scoliosis   PT Frequency 2x / week   PT Duration 8 weeks   PT Treatment/Interventions Cryotherapy;Electrical Stimulation;Moist Heat;Patient/family education;Neuromuscular re-education;Therapeutic exercise;Therapeutic activities;Manual  techniques;Passive range of motion;Dry needling;Energy conservation;Taping   PT Next Visit Plan gentle Soft tissue work to Rt hip and piriformis, gentle strengthening as tolerated   Consulted and Agree with Plan of Care Patient      Patient will benefit from skilled therapeutic intervention in order to improve the following deficits and impairments:  Pain, Decreased mobility, Decreased strength, Decreased activity tolerance, Impaired flexibility,  Increased muscle spasms, Increased fascial restricitons, Decreased range of motion  Visit Diagnosis: Muscle weakness (generalized)  Other idiopathic scoliosis, lumbar region  Chronic right-sided low back pain with bilateral sciatica     Problem List Patient Active Problem List   Diagnosis Date Noted  . Dyspnea on exertion 12/30/2013  . Chest pain on exertion 12/30/2013  . HTN (hypertension) 12/30/2013  . Hyperlipidemia 12/30/2013  . Rheumatoid arthritis(714.0)   . Hypothyroidism   . Breast cancer ([place])     Mikle Bosworth PTA 02/09/2017, 10:12 AM  [facility] [address] [place], [place], [address] Phone: [phone]   Fax:  [phone]  Name: BONNE WHACK MRN: [id number] Date of Birth: 1934-06-13

## 2017-02-12 ENCOUNTER — Encounter: Payer: Self-pay | Admitting: Physical Therapy

## 2017-02-12 ENCOUNTER — Ambulatory Visit: Payer: Medicare Other | Attending: Specialist | Admitting: Physical Therapy

## 2017-02-12 DIAGNOSIS — M6281 Muscle weakness (generalized): Secondary | ICD-10-CM | POA: Diagnosis not present

## 2017-02-12 DIAGNOSIS — M4126 Other idiopathic scoliosis, lumbar region: Secondary | ICD-10-CM

## 2017-02-12 DIAGNOSIS — M5442 Lumbago with sciatica, left side: Secondary | ICD-10-CM | POA: Insufficient documentation

## 2017-02-12 DIAGNOSIS — M5441 Lumbago with sciatica, right side: Secondary | ICD-10-CM | POA: Insufficient documentation

## 2017-02-12 DIAGNOSIS — G8929 Other chronic pain: Secondary | ICD-10-CM | POA: Diagnosis not present

## 2017-02-12 NOTE — Therapy (Signed)
[facility] [address] [place], [place], [address] Phone: [phone]   Fax:  [phone]  Physical Therapy Treatment  Patient Details  Name: [name] MRN: [id number] Date of Birth: July 17, 1934 Referring Provider: Dr. Susa Day   Encounter Date: 02/12/2017      PT End of Session - 02/12/17 1014    Visit Number 7   Number of Visits 10   Date for PT Re-Evaluation 03/18/17   Authorization Type medicare g-code 10th visit; KX modifier 15th visit   PT Start Time 0930   PT Stop Time 1030   PT Time Calculation (min) 60 min   Activity Tolerance Patient tolerated treatment well   Behavior During Therapy [facility] for tasks assessed/performed      Past Medical History:  Diagnosis Date  . Abnormal cardiovascular stress test 12/2013   s/p cath with mild nonobstructive CAD normal EF - managed medically  . Breast cancer ([place]) 2008   RT, chemo, lumpectomy  . Diverticulitis   . DVT (deep vein thrombosis) in pregnancy [facility])    site of PICC catheter  . HTN (hypertension)   . Hyperlipidemia   . Hypothyroidism   . Kidney tumor 2010   RFA  . Prediabetes   . Rheumatoid arthritis(714.0)   . UTI (lower urinary tract infection)     Past Surgical History:  Procedure Laterality Date  . ABDOMINAL SURGERY     for ruptured diverticuli  . bilateral foot surgery    . BREAST LUMPECTOMY    . CARDIAC CATHETERIZATION    . CERVICAL SPINE SURGERY    . LEFT HEART CATHETERIZATION WITH CORONARY ANGIOGRAM N/A 01/17/2014   Procedure: LEFT HEART CATHETERIZATION WITH CORONARY ANGIOGRAM;  Surgeon: Peter M Martinique, MD;  Location: [facility] CATH LAB;  Service: Cardiovascular;  Laterality: N/A;  . OVARIAN CYST REMOVAL    . perforation of colon    . renal RFA    . take down of colonostomy  2011  . TONSILLECTOMY      There were no vitals filed for this visit.      Subjective Assessment - 02/12/17 0935    Subjective I felt great after last  visit. I was able to shop afterwards.    How long can you sit comfortably? 1 hour increased pain   How long can you stand comfortably? 30 min in church   How long can you walk comfortably? not increase pain   Patient Stated Goals stronger muscles and less pain   Currently in Pain? Yes   Pain Score 4    Pain Location Back   Pain Orientation Right   Pain Descriptors / Indicators Aching   Pain Type Chronic pain   Pain Radiating Towards not down right leg today   Pain Onset More than a month ago   Pain Frequency Intermittent   Aggravating Factors  sitting, standing, washing  kitchen floor, bending over, working outside   Pain Relieving Factors heat   Multiple Pain Sites No                         [facility] Adult PT Treatment/Exercise - 02/12/17 0001      Moist Heat Therapy   Moist Heat Location Lumbar Spine  left sidely     Electrical Stimulation   Electrical Stimulation Location Rt lumbar spine and Rt glutes  lfet sidely   Electrical Stimulation Action IFC   Electrical Stimulation Parameters 15 minutes, to patient  tolerance   Electrical Stimulation Goals Pain     Manual Therapy   Manual Therapy Soft tissue mobilization   Soft tissue mobilization right quadratus, right thoracic lumbar paraspinals, right guteus medius, right ribcage intercoastals  left sidely                PT Education - 02/12/17 0958    Education provided Yes   Education Details body mechanics, back strengthening   Person(s) Educated Patient   Methods Explanation;Demonstration;Verbal cues;Handout   Comprehension Verbalized understanding;Returned demonstration          PT Short Term Goals - 02/09/17 0935      PT SHORT TERM GOAL #3   Title lumbar pain reduced >/= 25% due to improved posture and core strength   Time 4   Period Weeks   Status On-going     PT SHORT TERM GOAL #4   Title understand correct body mechanics to decreased strain on the lumbar spine   Time 4   Period  Weeks   Status On-going           PT Long Term Goals - 02/09/17 8676      PT LONG TERM GOAL #1   Title return demostation on correct lifting and perform daily activities with correct posture   Time 8   Period Weeks   Status On-going     PT LONG TERM GOAL #2   Title lumbar pain decreased >/= 50% due to understanding ways to manage pain   Time 8   Period Weeks   Status On-going     PT LONG TERM GOAL #3   Title understand home exercise program for trunk and extremity strengthening with correct postures   Time 8   Period Weeks   Status On-going     PT LONG TERM GOAL #4   Title FOTO score </= 47% limitation   Time 8   Period Weeks   Status On-going               Plan - 02/12/17 1014    Clinical Impression Statement Patient understands how to perfrom daily tasks with proper body mechanics and do yardwork.  Patient was able to shop after last week.  Patient is learning gentle back strengthening exercises. Patient will benefit from skilled therapy for pain management and core stability.    Rehab Potential Excellent   Clinical Impairments Affecting Rehab Potential history of breast cancer; osteoporosis; scoliosis   PT Frequency 2x / week   PT Duration 8 weeks   PT Treatment/Interventions Cryotherapy;Electrical Stimulation;Moist Heat;Patient/family education;Neuromuscular re-education;Therapeutic exercise;Therapeutic activities;Manual techniques;Passive range of motion;Dry needling;Energy conservation;Taping   PT Next Visit Plan gentle Soft tissue work to Rt hip and piriformis, gentle strengthening as tolerated   PT Home Exercise Plan progress as tolerated   Consulted and Agree with Plan of Care Patient      Patient will benefit from skilled therapeutic intervention in order to improve the following deficits and impairments:  Pain, Decreased mobility, Decreased strength, Decreased activity tolerance, Impaired flexibility, Increased muscle spasms, Increased fascial  restricitons, Decreased range of motion  Visit Diagnosis: Muscle weakness (generalized)  Other idiopathic scoliosis, lumbar region  Chronic right-sided low back pain with bilateral sciatica     Problem List Patient Active Problem List   Diagnosis Date Noted  . Dyspnea on exertion 12/30/2013  . Chest pain on exertion 12/30/2013  . HTN (hypertension) 12/30/2013  . Hyperlipidemia 12/30/2013  . Rheumatoid arthritis(714.0)   . Hypothyroidism   .  Breast cancer [facility])     Earlie Counts, PT 02/12/17 10:17 AM   [facility] [address], [place], [place], [address] Phone: [phone]   Fax:  [phone]  Name: MERI PELOT MRN: [id number] Date of Birth: [birth year]

## 2017-02-12 NOTE — Patient Instructions (Addendum)
Gardening - Weeding / Engineer, maintenance or kneel. Knee pads may be helpful. Keep feet apart for squat.  When get up tighten your stomach and inner thighs.  A stool would be helpful.   Copyright  VHI. All rights reserved.    Gardening - Wagon    To [employer], pull along side while walking. Avoid bending or twisting your back.   Copyright  VHI. All rights reserved.  Laundry - Unloading Dryer    Squat down to reach into clothes dryer. Small items can be placed in a large zippered mesh bag, and pulled out using a reacher. No twisting.  Copyright  VHI. All rights reserved.   Laundry - Unloading Dryer    Squat down to reach into clothes dryer. Small items can be placed in a large zippered mesh bag, and pulled out using a reacher.  Copyright  VHI. All rights reserved.  Refrigerator    Squat with knees apart to reach lower shelves and drawers. Get grabber and a small rubber step stool. Do not reach back.   Copyright  VHI. All rights reserved.   Lifting Principles  .Maintain proper posture and head alignment. .Slide object as close as possible before lifting. .Move obstacles out of the way. .Test before lifting; ask for help if too heavy. .Tighten stomach muscles without holding breath. .Use smooth movements; do not jerk. .Use legs to do the work, and pivot with feet. .Distribute the work load symmetrically and close to the center of trunk. .Push instead of pull whenever possible.  Copyright  VHI. All rights reserved.  Reaching    DO: Use "Golfer's Reach" when getting item from refrigerator, washing machine, dryer, dishwasher, etc. DO: Kneel to get item from low area. DON'T: Bend your back to get item from low area.  Copyright  VHI. All rights reserved.  Standing Activities    During any prolonged standing activity (e.g., ironing, washing dishes, chopping vegetables), elevate one foot on a stool or on the shelf under the kitchen sink. Alternate  feet.  Copyright  VHI. All rights reserved.  Computer Work    Dance movement psychotherapist" on edge of chair or use a saddle-type seat. Keep natural arch in lower back. Adjust height of table and stool so that you can look straight ahead or slightly down at monitor. Use back support only as necessary. Consider use of computer glasses as needed. Do not sit more than 30 min.  Copyright  VHI. All rights reserved.  Eating    Sit, protecting natural arch in low back. Bring food to mouth, not mouth to food. Do not lean on elbows or arms.  Copyright  VHI. All rights reserved.  Buttock Squeeze    On back, knees straight, legs together, not rotated outward. Squeezing buttocks together, say tight, tighter, tightest, or count 1, 2, 3. Hold _5__ seconds. Relax. Repeat _5__ times. Place pillows under knees. Copyright  VHI. All rights reserved.  Elbow Press    Interlace fingers; bring hands underneath head. Press elbows down. Hold _5__ seconds. Relax arms. Repeat _5__ times.  Copyright  VHI. All rights reserved.  Arm Lengthener: Double    Arms at sides, palms down. Lift both arms over head to alongside ears, keeping elbows straight. Lengthen arms by pulling ribs away from pelvis. Hold _3__ seconds. Relax. Return arms to sides. 5 times.   Copyright  VHI. All rights reserved.  [facility] [address], [place], [place] [address] Phone # [phone] Fax [phone]

## 2017-02-16 ENCOUNTER — Ambulatory Visit: Payer: Medicare Other | Admitting: Physical Therapy

## 2017-02-16 DIAGNOSIS — M5441 Lumbago with sciatica, right side: Secondary | ICD-10-CM | POA: Diagnosis not present

## 2017-02-16 DIAGNOSIS — G8929 Other chronic pain: Secondary | ICD-10-CM | POA: Diagnosis not present

## 2017-02-16 DIAGNOSIS — M4126 Other idiopathic scoliosis, lumbar region: Secondary | ICD-10-CM | POA: Diagnosis not present

## 2017-02-16 DIAGNOSIS — M6281 Muscle weakness (generalized): Secondary | ICD-10-CM | POA: Diagnosis not present

## 2017-02-16 DIAGNOSIS — M5442 Lumbago with sciatica, left side: Secondary | ICD-10-CM | POA: Diagnosis not present

## 2017-02-16 NOTE — Patient Instructions (Signed)
(  Clinic) Extension / Flexion (Assist)    Face pulley, right arm as far forward and up as is pain free. Pull arm down toward side. Repeat ___10_ times per set. Do __2__ sets per session.   Copyright  VHI. All rights reserved.  (Clinic) Retraction: Row - Bilateral (Pulley)    Facing pulley, arms reaching forward, pull hands toward stomach, pinching shoulder blades together. Repeat ___10_ times per set. Do __2__ sets per session.   Copyright  VHI. All rights reserved.   [facility] [address], [place], [place] [address] Phone # [phone] Fax [phone]

## 2017-02-16 NOTE — Therapy (Signed)
[facility] [address] [place], [place], [address] Phone: [phone]   Fax:  [phone]  Physical Therapy Treatment  Patient Details  Name: BRITTANNY LEVENHAGEN MRN: [id number] Date of Birth: 1934-07-22 Referring Provider: Dr. Susa Day   Encounter Date: 02/16/2017      PT End of Session - 02/16/17 0851    Visit Number 8   Number of Visits 10   Date for PT Re-Evaluation 03/18/17   Authorization Type medicare g-code 10th visit; KX modifier 15th visit   PT Start Time 0847   PT Stop Time 0934   PT Time Calculation (min) 47 min   Activity Tolerance Patient tolerated treatment well   Behavior During Therapy [facility] for tasks assessed/performed      Past Medical History:  Diagnosis Date  . Abnormal cardiovascular stress test 12/2013   s/p cath with mild nonobstructive CAD normal EF - managed medically  . Breast cancer ([place]) 2008   RT, chemo, lumpectomy  . Diverticulitis   . DVT (deep vein thrombosis) in pregnancy [facility])    site of PICC catheter  . HTN (hypertension)   . Hyperlipidemia   . Hypothyroidism   . Kidney tumor 2010   RFA  . Prediabetes   . Rheumatoid arthritis(714.0)   . UTI (lower urinary tract infection)     Past Surgical History:  Procedure Laterality Date  . ABDOMINAL SURGERY     for ruptured diverticuli  . bilateral foot surgery    . BREAST LUMPECTOMY    . CARDIAC CATHETERIZATION    . CERVICAL SPINE SURGERY    . LEFT HEART CATHETERIZATION WITH CORONARY ANGIOGRAM N/A 01/17/2014   Procedure: LEFT HEART CATHETERIZATION WITH CORONARY ANGIOGRAM;  Surgeon: Peter M Martinique, MD;  Location: [facility] CATH LAB;  Service: Cardiovascular;  Laterality: N/A;  . OVARIAN CYST REMOVAL    . perforation of colon    . renal RFA    . take down of colonostomy  2011  . TONSILLECTOMY      There were no vitals filed for this visit.      Subjective Assessment - 02/16/17 0849    Subjective Yesterday was murder.  Not  bad today   How long can you sit comfortably? 1 hour increased pain   How long can you stand comfortably? 30 min in church   How long can you walk comfortably? not increase pain   Patient Stated Goals stronger muscles and less pain   Currently in Pain? Yes   Pain Score 4    Pain Location Back   Pain Orientation Right   Pain Descriptors / Indicators Aching   Pain Type Chronic pain   Pain Onset More than a month ago   Pain Frequency Intermittent   Multiple Pain Sites No                         [facility] Adult PT Treatment/Exercise - 02/16/17 0001      Lumbar Exercises: Stretches   Active Hamstring Stretch 2 reps;10 seconds  Leg lengthening     Lumbar Exercises: Standing   Row Strengthening;20 reps;Theraband   Theraband Level (Row) Level 3 (Green)   Shoulder Extension Strengthening;20 reps;Theraband   Theraband Level (Shoulder Extension) Level 2 (Red)     Moist Heat Therapy   Number Minutes Moist Heat 15 Minutes   Moist Heat Location Lumbar Spine  left sidely     Electrical Stimulation   Electrical Stimulation Location  Rt lumbar spine and Rt glutes  lfet sidely   Electrical Stimulation Action IFC   Electrical Stimulation Parameters 15 min to tolerance   Electrical Stimulation Goals Pain     Manual Therapy   Manual Therapy Soft tissue mobilization   Soft tissue mobilization right quadratus, right thoracic lumbar paraspinals, right guteus medius, right ribcage intercoastals  left sidely                PT Education - 02/16/17 0939    Education provided Yes   Education Details row and extension with abdominal bracing   Person(s) Educated Patient   Methods Explanation;Demonstration;Tactile cues;Verbal cues;Handout   Comprehension Verbalized understanding;Returned demonstration          PT Short Term Goals - 02/09/17 0935      PT SHORT TERM GOAL #3   Title lumbar pain reduced >/= 25% due to improved posture and core strength   Time 4   Period  Weeks   Status On-going     PT SHORT TERM GOAL #4   Title understand correct body mechanics to decreased strain on the lumbar spine   Time 4   Period Weeks   Status On-going           PT Long Term Goals - 02/09/17 2979      PT LONG TERM GOAL #1   Title return demostation on correct lifting and perform daily activities with correct posture   Time 8   Period Weeks   Status On-going     PT LONG TERM GOAL #2   Title lumbar pain decreased >/= 50% due to understanding ways to manage pain   Time 8   Period Weeks   Status On-going     PT LONG TERM GOAL #3   Title understand home exercise program for trunk and extremity strengthening with correct postures   Time 8   Period Weeks   Status On-going     PT LONG TERM GOAL #4   Title FOTO score </= 47% limitation   Time 8   Period Weeks   Status On-going               Plan - 02/16/17 8921    Clinical Impression Statement Pt understanding new exercises, needed cues to reduce upper trap contraction.  continues to have muscle spasms in right glutes, piriformis and lumbar paraspinals.  Pt overall improved and reports stretches help.  Skilled PT needed to address core and and postural strength deficits.   Rehab Potential Excellent   Clinical Impairments Affecting Rehab Potential history of breast cancer; osteoporosis; scoliosis   PT Treatment/Interventions Cryotherapy;Electrical Stimulation;Moist Heat;Patient/family education;Neuromuscular re-education;Therapeutic exercise;Therapeutic activities;Manual techniques;Passive range of motion;Dry needling;Energy conservation;Taping   PT Next Visit Plan review rows and extension with abdominal bracing, gentle Soft tissue work to Rt hip and piriformis, gentle strengthening as tolerated   Consulted and Agree with Plan of Care Patient      Patient will benefit from skilled therapeutic intervention in order to improve the following deficits and impairments:  Pain, Decreased mobility,  Decreased strength, Decreased activity tolerance, Impaired flexibility, Increased muscle spasms, Increased fascial restricitons, Decreased range of motion  Visit Diagnosis: Muscle weakness (generalized)  Other idiopathic scoliosis, lumbar region  Chronic right-sided low back pain with bilateral sciatica     Problem List Patient Active Problem List   Diagnosis Date Noted  . Dyspnea on exertion 12/30/2013  . Chest pain on exertion 12/30/2013  . HTN (hypertension) 12/30/2013  . Hyperlipidemia 12/30/2013  .  Rheumatoid arthritis(714.0)   . Hypothyroidism   . Breast cancer [facility])     Zannie Cove, PT 02/16/2017, 9:41 AM  [facility] [address], [place], [place], [address] Phone: [phone]   Fax:  [phone]  Name: ANJELICA GORNIAK MRN: [id number] Date of Birth: 1934-09-03

## 2017-02-17 DIAGNOSIS — L97511 Non-pressure chronic ulcer of other part of right foot limited to breakdown of skin: Secondary | ICD-10-CM | POA: Diagnosis not present

## 2017-02-17 DIAGNOSIS — L97521 Non-pressure chronic ulcer of other part of left foot limited to breakdown of skin: Secondary | ICD-10-CM | POA: Diagnosis not present

## 2017-02-18 DIAGNOSIS — M810 Age-related osteoporosis without current pathological fracture: Secondary | ICD-10-CM | POA: Diagnosis not present

## 2017-02-19 ENCOUNTER — Ambulatory Visit: Payer: Medicare Other | Admitting: Physical Therapy

## 2017-02-19 ENCOUNTER — Encounter: Payer: Self-pay | Admitting: Physical Therapy

## 2017-02-19 DIAGNOSIS — M5441 Lumbago with sciatica, right side: Secondary | ICD-10-CM

## 2017-02-19 DIAGNOSIS — M4126 Other idiopathic scoliosis, lumbar region: Secondary | ICD-10-CM

## 2017-02-19 DIAGNOSIS — G8929 Other chronic pain: Secondary | ICD-10-CM | POA: Diagnosis not present

## 2017-02-19 DIAGNOSIS — M6281 Muscle weakness (generalized): Secondary | ICD-10-CM | POA: Diagnosis not present

## 2017-02-19 DIAGNOSIS — M5442 Lumbago with sciatica, left side: Secondary | ICD-10-CM | POA: Diagnosis not present

## 2017-02-19 NOTE — Therapy (Signed)
[facility] [address] [place], [place], [address] Phone: [phone]   Fax:  [phone]  Physical Therapy Treatment  Patient Details  Name: [name] MRN: [id number] Date of Birth: 1934-07-02 Referring Provider: Dr. Susa Day   Encounter Date: 02/19/2017      PT End of Session - 02/19/17 0934    Visit Number 9   Number of Visits 10   Date for PT Re-Evaluation 03/18/17   Authorization Type medicare g-code 10th visit; KX modifier 15th visit   PT Start Time 0930   PT Stop Time 1014   PT Time Calculation (min) 44 min   Activity Tolerance Patient tolerated treatment well   Behavior During Therapy [facility] for tasks assessed/performed      Past Medical History:  Diagnosis Date  . Abnormal cardiovascular stress test 12/2013   s/p cath with mild nonobstructive CAD normal EF - managed medically  . Breast cancer ([place]) 2008   RT, chemo, lumpectomy  . Diverticulitis   . DVT (deep vein thrombosis) in pregnancy [facility])    site of PICC catheter  . HTN (hypertension)   . Hyperlipidemia   . Hypothyroidism   . Kidney tumor 2010   RFA  . Prediabetes   . Rheumatoid arthritis(714.0)   . UTI (lower urinary tract infection)     Past Surgical History:  Procedure Laterality Date  . ABDOMINAL SURGERY     for ruptured diverticuli  . bilateral foot surgery    . BREAST LUMPECTOMY    . CARDIAC CATHETERIZATION    . CERVICAL SPINE SURGERY    . LEFT HEART CATHETERIZATION WITH CORONARY ANGIOGRAM N/A 01/17/2014   Procedure: LEFT HEART CATHETERIZATION WITH CORONARY ANGIOGRAM;  Surgeon: Peter M Martinique, MD;  Location: [facility] CATH LAB;  Service: Cardiovascular;  Laterality: N/A;  . OVARIAN CYST REMOVAL    . perforation of colon    . renal RFA    . take down of colonostomy  2011  . TONSILLECTOMY      There were no vitals filed for this visit.      Subjective Assessment - 02/19/17 0933    Subjective Low back is hurting since I  went up into the attic yesterday. This morning is better than yesterday but I had to take pain meds to sleep.    How long can you sit comfortably? 1 hour increased pain   How long can you stand comfortably? 30 min in church   How long can you walk comfortably? not increase pain   Patient Stated Goals stronger muscles and less pain   Currently in Pain? Yes   Pain Location Back   Pain Orientation Right   Pain Descriptors / Indicators Aching   Pain Type Chronic pain                         [facility] Adult PT Treatment/Exercise - 02/19/17 0001      Lumbar Exercises: Stretches   Active Hamstring Stretch 2 reps;10 seconds  Leg lengthening     Lumbar Exercises: Standing   Row Strengthening;20 reps;Theraband   Theraband Level (Row) Level 3 (Green)   Shoulder Extension Strengthening;20 reps;Theraband   Theraband Level (Shoulder Extension) Level 2 (Red)     Lumbar Exercises: Seated   Sit to Stand 20 reps     Modalities   Modalities Electrical Stimulation;Moist Heat     Moist Heat Therapy   Number Minutes Moist Heat 15 Minutes  Moist Heat Location Lumbar Spine  left sidely     Electrical Stimulation   Electrical Stimulation Location Rt lumbar spine and Rt glutes  lfet sidely   Electrical Stimulation Action IFC   Electrical Stimulation Parameters 15 minutes to tolerate   Electrical Stimulation Goals Pain     Manual Therapy   Manual Therapy Soft tissue mobilization   Soft tissue mobilization right quadratus, right thoracic lumbar paraspinals, right guteus medius, right ribcage intercoastals  left sidely                  PT Short Term Goals - 02/09/17 0935      PT SHORT TERM GOAL #3   Title lumbar pain reduced >/= 25% due to improved posture and core strength   Time 4   Period Weeks   Status On-going     PT SHORT TERM GOAL #4   Title understand correct body mechanics to decreased strain on the lumbar spine   Time 4   Period Weeks   Status  On-going           PT Long Term Goals - 02/09/17 3299      PT LONG TERM GOAL #1   Title return demostation on correct lifting and perform daily activities with correct posture   Time 8   Period Weeks   Status On-going     PT LONG TERM GOAL #2   Title lumbar pain decreased >/= 50% due to understanding ways to manage pain   Time 8   Period Weeks   Status On-going     PT LONG TERM GOAL #3   Title understand home exercise program for trunk and extremity strengthening with correct postures   Time 8   Period Weeks   Status On-going     PT LONG TERM GOAL #4   Title FOTO score </= 47% limitation   Time 8   Period Weeks   Status On-going               Plan - 02/19/17 1304    Clinical Impression Statement Patient reports having back pain yesterday after going up the attic stairs. Patient did some of the new exercises at home and felt good with them. Patient able to tolerate some strengthening exercises well. Continues to respond well to manual soft tissue mobilization and modalities.     Rehab Potential Excellent   Clinical Impairments Affecting Rehab Potential history of breast cancer; osteoporosis; scoliosis   PT Frequency 2x / week   PT Duration 8 weeks   PT Treatment/Interventions Cryotherapy;Electrical Stimulation;Moist Heat;Patient/family education;Neuromuscular re-education;Therapeutic exercise;Therapeutic activities;Manual techniques;Passive range of motion;Dry needling;Energy conservation;Taping   PT Next Visit Plan Gentle strengthening as tolerated, cervical stretches   Consulted and Agree with Plan of Care Patient      Patient will benefit from skilled therapeutic intervention in order to improve the following deficits and impairments:  Pain, Decreased mobility, Decreased strength, Decreased activity tolerance, Impaired flexibility, Increased muscle spasms, Increased fascial restricitons, Decreased range of motion  Visit Diagnosis: Muscle weakness  (generalized)  Other idiopathic scoliosis, lumbar region  Chronic right-sided low back pain with bilateral sciatica     Problem List Patient Active Problem List   Diagnosis Date Noted  . Dyspnea on exertion 12/30/2013  . Chest pain on exertion 12/30/2013  . HTN (hypertension) 12/30/2013  . Hyperlipidemia 12/30/2013  . Rheumatoid arthritis(714.0)   . Hypothyroidism   . Breast cancer ([place])     Mikle Bosworth PTA 02/19/2017, 1:09 PM  [facility] [address] [place], [place], [address] Phone: [phone]   Fax:  [phone]  Name: [name] MRN: [id number] Date of Birth: Apr 08, 1934

## 2017-02-23 ENCOUNTER — Encounter: Payer: Medicare Other | Admitting: Physical Therapy

## 2017-02-25 DIAGNOSIS — Z79899 Other long term (current) drug therapy: Secondary | ICD-10-CM | POA: Diagnosis not present

## 2017-02-25 DIAGNOSIS — E663 Overweight: Secondary | ICD-10-CM | POA: Diagnosis not present

## 2017-02-25 DIAGNOSIS — Z6825 Body mass index (BMI) 25.0-25.9, adult: Secondary | ICD-10-CM | POA: Diagnosis not present

## 2017-02-25 DIAGNOSIS — M2550 Pain in unspecified joint: Secondary | ICD-10-CM | POA: Diagnosis not present

## 2017-02-25 DIAGNOSIS — M0579 Rheumatoid arthritis with rheumatoid factor of multiple sites without organ or systems involvement: Secondary | ICD-10-CM | POA: Diagnosis not present

## 2017-02-26 ENCOUNTER — Ambulatory Visit: Payer: Medicare Other | Admitting: Physical Therapy

## 2017-02-26 ENCOUNTER — Encounter: Payer: Self-pay | Admitting: Physical Therapy

## 2017-02-26 DIAGNOSIS — M6281 Muscle weakness (generalized): Secondary | ICD-10-CM

## 2017-02-26 DIAGNOSIS — M4126 Other idiopathic scoliosis, lumbar region: Secondary | ICD-10-CM | POA: Diagnosis not present

## 2017-02-26 DIAGNOSIS — G8929 Other chronic pain: Secondary | ICD-10-CM

## 2017-02-26 DIAGNOSIS — M5441 Lumbago with sciatica, right side: Secondary | ICD-10-CM

## 2017-02-26 DIAGNOSIS — M5442 Lumbago with sciatica, left side: Secondary | ICD-10-CM | POA: Diagnosis not present

## 2017-02-26 NOTE — Therapy (Addendum)
[facility] [address], [place], [place], [address] Phone: [phone]   Fax:  [phone]  Physical Therapy Treatment  Patient Details  Name: [name] MRN: [id number] Date of Birth: 11-21-1933 Referring Provider: Dr. Susa Day  Encounter Date: 02/26/2017      PT End of Session - 02/26/17 1019    Visit Number 10   Number of Visits 10   Date for PT Re-Evaluation 03/18/17   Authorization Type medicare g-code 10th visit; KX modifier 15th visit   PT Start Time 1015   PT Stop Time 1105   PT Time Calculation (min) 50 min   Activity Tolerance Patient tolerated treatment well   Behavior During Therapy [facility] for tasks assessed/performed      Past Medical History:  Diagnosis Date  . Abnormal cardiovascular stress test 12/2013   s/p cath with mild nonobstructive CAD normal EF - managed medically  . Breast cancer ([place]) 2008   RT, chemo, lumpectomy  . Diverticulitis   . DVT (deep vein thrombosis) in pregnancy [facility])    site of PICC catheter  . HTN (hypertension)   . Hyperlipidemia   . Hypothyroidism   . Kidney tumor 2010   RFA  . Prediabetes   . Rheumatoid arthritis(714.0)   . UTI (lower urinary tract infection)     Past Surgical History:  Procedure Laterality Date  . ABDOMINAL SURGERY     for ruptured diverticuli  . bilateral foot surgery    . BREAST LUMPECTOMY    . CARDIAC CATHETERIZATION    . CERVICAL SPINE SURGERY    . LEFT HEART CATHETERIZATION WITH CORONARY ANGIOGRAM N/A 01/17/2014   Procedure: LEFT HEART CATHETERIZATION WITH CORONARY ANGIOGRAM;  Surgeon: Peter M Martinique, MD;  Location: [facility] CATH LAB;  Service: Cardiovascular;  Laterality: N/A;  . OVARIAN CYST REMOVAL    . perforation of colon    . renal RFA    . take down of colonostomy  2011  . TONSILLECTOMY      There were no vitals filed for this visit.      Subjective Assessment - 02/26/17 1017    Subjective A little sore this morning.  I have poison ivey and I'm on prednizone which keeps me awake. I layed flat on the floow this morning and that helps.    How long can you sit comfortably? 1 hour increased pain   How long can you stand comfortably? 30 min in church   How long can you walk comfortably? not increase pain   Patient Stated Goals stronger muscles and less pain   Currently in Pain? Yes   Pain Score 6             [facility] PT Assessment - 02/26/17 0001      Assessment   Medical Diagnosis M51.36 Lumbar DDD: M41.26 Other idiopathic scoliosis   Referring Provider Dr. Susa Day   Onset Date/Surgical Date 08/13/16   Prior Therapy Yes in 01/2015     Precautions   Precautions Other (comment)   Precaution Comments osteoporosis; breast cancer     Restrictions   Weight Bearing Restrictions No     Balance Screen   Has the patient fallen in the past 6 months No   Has the patient had a decrease in activity level because of a fear of falling?  No   Is the patient reluctant to leave their home because of a fear of falling?  No     Home Environment  Living Environment Private residence     Prior Function   Level of Independence Independent   Vocation Retired   Leisure outside work; walk daily 1/2 mile     Cognition   Overall Cognitive Status Within Functional Limits for tasks assessed     Observation/Other Assessments   Focus on Therapeutic Outcomes (FOTO)  48% limitation  goal is 47% limitation     Posture/Postural Control   Posture/Postural Control Postural limitations   Postural Limitations Increased thoracic kyphosis;Decreased lumbar lordosis;Forward head;Rounded Shoulders   Posture Comments concave thoracic curve to right, lumbar concave curve to the left                     [facility] Adult PT Treatment/Exercise - 02/26/17 0001      Lumbar Exercises: Stretches   Active Hamstring Stretch 2 reps;10 seconds  Leg lengthening     Lumbar Exercises: Aerobic   Stationary Bike Nustep L1 x 6  minutes  Therapipst present to discuss treatment     Lumbar Exercises: Standing   Row Strengthening;20 reps;Theraband   Theraband Level (Row) Level 2 (Red)   Shoulder Extension Strengthening;20 reps;Theraband   Theraband Level (Shoulder Extension) Level 2 (Red)     Lumbar Exercises: Seated   Sit to Stand 20 reps  VC for abdominal brace     Modalities   Modalities Electrical Stimulation;Moist Heat     Moist Heat Therapy   Number Minutes Moist Heat 15 Minutes   Moist Heat Location Lumbar Spine  left sidely     Electrical Stimulation   Electrical Stimulation Location Rt lumbar spine and Rt glutes  lfet sidely   Electrical Stimulation Action IFC   Electrical Stimulation Parameters 15 minutes to tolerance   Electrical Stimulation Goals Pain     Manual Therapy   Manual Therapy Soft tissue mobilization  gentle   Soft tissue mobilization right quadratus, right thoracic lumbar paraspinals, right guteus medius, right ribcage intercoastals  left sidely                  PT Short Term Goals - 02/26/17 1021      PT SHORT TERM GOAL #2   Title understand what postures to avoid to decrease spinal fractures.   Status Achieved     PT SHORT TERM GOAL #3   Title lumbar pain reduced >/= 25% due to improved posture and core strength   Baseline 60% reduction of sharp pains   Time --   Period --   Status Achieved     PT SHORT TERM GOAL #4   Title understand correct body mechanics to decreased strain on the lumbar spine   Status Achieved           PT Long Term Goals - 02/26/17 1022      PT LONG TERM GOAL #1   Title return demostation on correct lifting and perform daily activities with correct posture   Time 8   Period Weeks   Status On-going     PT LONG TERM GOAL #2   Title lumbar pain decreased >/= 50% due to understanding ways to manage pain   Baseline 60% reduction of sharp pains   Time --   Period --   Status Achieved     PT LONG TERM GOAL #3   Title  understand home exercise program for trunk and extremity strengthening with correct postures   Time 8   Period Weeks   Status On-going     PT  LONG TERM GOAL #4   Title FOTO score </= 47% limitation               Plan - Mar 28, 2017 1150    Clinical Impression Statement Patient progressing well with all strengthening. Patient reports she feels that since starting physical therapy she has has 60% improvement with sharp back pains and now only has soreness and weakness. Patient Has reduced FOTO score to 48% limitation. Patient will continue to benefit from skilled thearpy for core strenght and stability.    Rehab Potential Excellent   Clinical Impairments Affecting Rehab Potential history of breast cancer; osteoporosis; scoliosis   PT Frequency 2x / week   PT Duration 8 weeks   PT Treatment/Interventions Cryotherapy;Electrical Stimulation;Moist Heat;Patient/family education;Neuromuscular re-education;Therapeutic exercise;Therapeutic activities;Manual techniques;Passive range of motion;Dry needling;Energy conservation;Taping   PT Next Visit Plan Gentle strengthening as tolerated, cervical stretches   Consulted and Agree with Plan of Care Patient      Patient will benefit from skilled therapeutic intervention in order to improve the following deficits and impairments:  Pain, Decreased mobility, Decreased strength, Decreased activity tolerance, Impaired flexibility, Increased muscle spasms, Increased fascial restricitons, Decreased range of motion  Visit Diagnosis: Muscle weakness (generalized)  Other idiopathic scoliosis, lumbar region  Chronic right-sided low back pain with bilateral sciatica       G-Codes - 2017/03/28 1049    Functional Assessment Tool Used (Outpatient Only) FOTO score is 48% limitation   Functional Limitation Other PT primary   Other PT Primary Current Status (K9179) At least 40 percent but less than 60 percent impaired, limited or restricted   Other PT Primary  Goal Status (X5056) At least 40 percent but less than 60 percent impaired, limited or restricted      Problem List Patient Active Problem List   Diagnosis Date Noted  . Dyspnea on exertion 12/30/2013  . Chest pain on exertion 12/30/2013  . HTN (hypertension) 12/30/2013  . Hyperlipidemia 12/30/2013  . Rheumatoid arthritis(714.0)   . Hypothyroidism   . Breast cancer [facility])    Earlie Counts, PT 03-28-2017 3:30 PM   Mikle Bosworth, PTA 2017-03-28 3:30 PM  [place] [address], [place], [place], [address] Phone: [phone]   Fax:  [phone]  Name: [name] MRN: [id number] Date of Birth: November 07, 1933

## 2017-03-02 ENCOUNTER — Encounter: Payer: Self-pay | Admitting: Physical Therapy

## 2017-03-02 ENCOUNTER — Ambulatory Visit: Payer: Medicare Other | Admitting: Physical Therapy

## 2017-03-02 DIAGNOSIS — M5441 Lumbago with sciatica, right side: Secondary | ICD-10-CM | POA: Diagnosis not present

## 2017-03-02 DIAGNOSIS — M6281 Muscle weakness (generalized): Secondary | ICD-10-CM | POA: Diagnosis not present

## 2017-03-02 DIAGNOSIS — G8929 Other chronic pain: Secondary | ICD-10-CM | POA: Diagnosis not present

## 2017-03-02 DIAGNOSIS — M4126 Other idiopathic scoliosis, lumbar region: Secondary | ICD-10-CM | POA: Diagnosis not present

## 2017-03-02 DIAGNOSIS — M5442 Lumbago with sciatica, left side: Secondary | ICD-10-CM

## 2017-03-02 NOTE — Therapy (Signed)
[facility] [address] [place], [place], [address] Phone: [phone]   Fax:  [phone]  Physical Therapy Treatment  Patient Details  Name: [name] MRN: [id number] Date of Birth: 06-27-1934 Referring Provider: Dr. Susa Day  Encounter Date: 03/02/2017      PT End of Session - 03/02/17 0933    Visit Number 11   Number of Visits 20   Date for PT Re-Evaluation 03/18/17   Authorization Type medicare g-code 10th visit; KX modifier 15th visit   PT Start Time 0930   PT Stop Time 1021   PT Time Calculation (min) 51 min   Activity Tolerance Patient tolerated treatment well   Behavior During Therapy [facility] for tasks assessed/performed      Past Medical History:  Diagnosis Date  . Abnormal cardiovascular stress test 12/2013   s/p cath with mild nonobstructive CAD normal EF - managed medically  . Breast cancer ([place]) 2008   RT, chemo, lumpectomy  . Diverticulitis   . DVT (deep vein thrombosis) in pregnancy [facility])    site of PICC catheter  . HTN (hypertension)   . Hyperlipidemia   . Hypothyroidism   . Kidney tumor 2010   RFA  . Prediabetes   . Rheumatoid arthritis(714.0)   . UTI (lower urinary tract infection)     Past Surgical History:  Procedure Laterality Date  . ABDOMINAL SURGERY     for ruptured diverticuli  . bilateral foot surgery    . BREAST LUMPECTOMY    . CARDIAC CATHETERIZATION    . CERVICAL SPINE SURGERY    . LEFT HEART CATHETERIZATION WITH CORONARY ANGIOGRAM N/A 01/17/2014   Procedure: LEFT HEART CATHETERIZATION WITH CORONARY ANGIOGRAM;  Surgeon: Peter M Martinique, MD;  Location: [facility] CATH LAB;  Service: Cardiovascular;  Laterality: N/A;  . OVARIAN CYST REMOVAL    . perforation of colon    . renal RFA    . take down of colonostomy  2011  . TONSILLECTOMY      There were no vitals filed for this visit.      Subjective Assessment - 03/02/17 0938    Subjective A little achey this mornig.  Mostly in the left side. My right leg feels like it's not as weak as it was. No pain in legs. Patient stated she has been much more concious of posture and body mechanics this week and it has made a difference.    How long can you sit comfortably? 1 hour increased pain   How long can you stand comfortably? 30 min in church   How long can you walk comfortably? not increase pain   Patient Stated Goals stronger muscles and less pain   Currently in Pain? Yes   Pain Score 5    Pain Location Back   Pain Orientation Right   Pain Descriptors / Indicators Aching   Pain Type Chronic pain                         [facility] Adult PT Treatment/Exercise - 03/02/17 0001      Lumbar Exercises: Stretches   Active Hamstring Stretch 2 reps;10 seconds  Leg lengthening     Lumbar Exercises: Aerobic   Stationary Bike Nustep L2 x 6 minutes  Therapipst present to discuss treatment     Lumbar Exercises: Standing   Other Standing Lumbar Exercises Forward step up  x10 each leg     Lumbar Exercises: Seated  Sit to Stand 20 reps  VC for abdominal brace     Lumbar Exercises: Supine   Other Supine Lumbar Exercises Shoulder extension and external rotation  with abdominal brace, for postural strength     Modalities   Modalities Electrical Stimulation;Moist Heat     Moist Heat Therapy   Number Minutes Moist Heat 15 Minutes   Moist Heat Location Lumbar Spine  left sidely     Electrical Stimulation   Electrical Stimulation Location Rt lumbar spine and Rt glutes  lfet sidely   Electrical Stimulation Action IFC   Electrical Stimulation Parameters 15 minutes 16 ma   Electrical Stimulation Goals Pain     Manual Therapy   Manual Therapy Soft tissue mobilization  gentle   Soft tissue mobilization right quadratus, right and left thoracic lumbar paraspinals, right glute med  left sidely                  PT Short Term Goals - 02/26/17 1021      PT SHORT TERM GOAL #2   Title  understand what postures to avoid to decrease spinal fractures.   Status Achieved     PT SHORT TERM GOAL #3   Title lumbar pain reduced >/= 25% due to improved posture and core strength   Baseline 60% reduction of sharp pains   Time --   Period --   Status Achieved     PT SHORT TERM GOAL #4   Title understand correct body mechanics to decreased strain on the lumbar spine   Status Achieved           PT Long Term Goals - 03/02/17 0935      PT LONG TERM GOAL #1   Title return demostation on correct lifting and perform daily activities with correct posture   Time 8   Period Weeks   Status On-going     PT LONG TERM GOAL #2   Title lumbar pain decreased >/= 50% due to understanding ways to manage pain   Baseline 60% reduction of sharp pains   Status Achieved               Plan - 03/02/17 1008    Clinical Impression Statement Patient able to tolerate more strengthening exercises today. Having some posterior knee soreness with steps up but able to tolerate. Did better with shoulder and postural strengthening exercises in supine today, had no increased neck pain in this position. Patient having some pain in Lt low back today but responded well to soft tissue mobilization and modalities to Bil lumbar and throacic paraspinals. Patient will continue to benefit from skilled thearpy for strengthening and postural training.    Rehab Potential Excellent   Clinical Impairments Affecting Rehab Potential history of breast cancer; osteoporosis; scoliosis   PT Frequency 2x / week   PT Duration 8 weeks   PT Treatment/Interventions Cryotherapy;Electrical Stimulation;Moist Heat;Patient/family education;Neuromuscular re-education;Therapeutic exercise;Therapeutic activities;Manual techniques;Passive range of motion;Dry needling;Energy conservation;Taping   PT Next Visit Plan Gentle strengthening as tolerated, cervical stretches   Consulted and Agree with Plan of Care Patient      Patient  will benefit from skilled therapeutic intervention in order to improve the following deficits and impairments:  Pain, Decreased mobility, Decreased strength, Decreased activity tolerance, Impaired flexibility, Increased muscle spasms, Increased fascial restricitons, Decreased range of motion  Visit Diagnosis: Muscle weakness (generalized)  Other idiopathic scoliosis, lumbar region  Chronic right-sided low back pain with bilateral sciatica     Problem List Patient  Active Problem List   Diagnosis Date Noted  . Dyspnea on exertion 12/30/2013  . Chest pain on exertion 12/30/2013  . HTN (hypertension) 12/30/2013  . Hyperlipidemia 12/30/2013  . Rheumatoid arthritis(714.0)   . Hypothyroidism   . Breast cancer [facility])     Jeanie Sewer PTA 03/02/2017, 10:27 AM  [facility] [address] [place], [place], [address] Phone: [phone]   Fax:  [phone]  Name: [name] MRN: [id number] Date of Birth: 21-Sep-1934

## 2017-03-05 ENCOUNTER — Encounter: Payer: Self-pay | Admitting: Physical Therapy

## 2017-03-05 ENCOUNTER — Ambulatory Visit: Payer: Medicare Other | Admitting: Physical Therapy

## 2017-03-05 DIAGNOSIS — M6281 Muscle weakness (generalized): Secondary | ICD-10-CM

## 2017-03-05 DIAGNOSIS — M5441 Lumbago with sciatica, right side: Secondary | ICD-10-CM

## 2017-03-05 DIAGNOSIS — M5442 Lumbago with sciatica, left side: Secondary | ICD-10-CM | POA: Diagnosis not present

## 2017-03-05 DIAGNOSIS — M4126 Other idiopathic scoliosis, lumbar region: Secondary | ICD-10-CM | POA: Diagnosis not present

## 2017-03-05 DIAGNOSIS — G8929 Other chronic pain: Secondary | ICD-10-CM

## 2017-03-05 NOTE — Patient Instructions (Signed)
Bear Roots on One Leg - Modified    Stand in Horse Stance, elbows bent, palms lightly resting on back of chair. Shift weight onto one foot. Lift unweighted foot. Variation: Lift heel only. Repeat ___ times each side.  Copyright  VHI. All rights reserved.   Tandem Stance    Right foot in front of left, heel touching toe both feet "straight ahead". Stand on Foot Triangle of Support with both feet. Balance in this position ___ seconds. Do with left foot in front of right.  Copyright  VHI. All rights reserved.   Single Leg - Eyes Open  Holding support, lift right leg while maintaining balance over other leg. Progress to removing hands from support surface for longer periods of time. Hold____ seconds. Repeat ____ times per session. Do ____ sessions per day. Single Leg (Compliant Surface) - Eyes Open  Stand on compliant surface: ________ holding support. Lift right leg while maintaining balance over other leg. Progress to removing hands from support surface for longer periods of time. Hold____ seconds. Repeat ____ times per session. Do ____ sessions per day.  Feet Heel-Toe "Tandem", Arm Motion  Eyes Open  With eyes open, right foot directly in front of the other, move arms up and down: to front. Repeat ____ times per session. Do ____ sessions per day. Feet Heel-Toe "Tandem" (Compliant Surface) Arm Motion - Eyes Open  With eyes open, standing on compliant surface: ________, right foot directly in front of the other, move arms up and down: to front. Repeat ____ times per session. Do ____ per day.  Feet Heel-Toe "Tandem"   Arms outstretched, walk a straight line bringing one foot directly in front of the other. Repeat for ____ minutes per session. _ per day. Side-Stepping   Walk to left side with eyes open. Take even steps, leading with same foot. Repeat in other direction. Repeat for _ minutes per session.Do __ per day.   Braiding   Move to side: 1) cross right leg in front  of left, 2) bring back leg out to side, then 3) cross right leg behind left, 4) bring left leg out to side. Continue sequence in same direction. Reverse sequence, moving in opposite direction. Repeat sequence ____ times per session. Do ____ sessions per day. Repeat on compliant surface: ________.  Copyright  VHI. All rights reserved.

## 2017-03-05 NOTE — Therapy (Signed)
[facility] [address] [place], [place], [address] Phone: [phone]   Fax:  [phone]  Physical Therapy Treatment  Patient Details  Name: [name] MRN: [id number] Date of Birth: 03-13-1934 Referring Provider: Dr. Susa Day  Encounter Date: 03/05/2017      PT End of Session - 03/05/17 1058    Visit Number 12   Number of Visits 20   Date for PT Re-Evaluation 03/18/17   Authorization Type medicare g-code 10th visit; KX modifier 15th visit   PT Start Time 1015   PT Stop Time 1120   PT Time Calculation (min) 65 min   Activity Tolerance Patient tolerated treatment well   Behavior During Therapy [facility] for tasks assessed/performed      Past Medical History:  Diagnosis Date  . Abnormal cardiovascular stress test 12/2013   s/p cath with mild nonobstructive CAD normal EF - managed medically  . Breast cancer ([place]) 2008   RT, chemo, lumpectomy  . Diverticulitis   . DVT (deep vein thrombosis) in pregnancy [facility])    site of PICC catheter  . HTN (hypertension)   . Hyperlipidemia   . Hypothyroidism   . Kidney tumor 2010   RFA  . Prediabetes   . Rheumatoid arthritis(714.0)   . UTI (lower urinary tract infection)     Past Surgical History:  Procedure Laterality Date  . ABDOMINAL SURGERY     for ruptured diverticuli  . bilateral foot surgery    . BREAST LUMPECTOMY    . CARDIAC CATHETERIZATION    . CERVICAL SPINE SURGERY    . LEFT HEART CATHETERIZATION WITH CORONARY ANGIOGRAM N/A 01/17/2014   Procedure: LEFT HEART CATHETERIZATION WITH CORONARY ANGIOGRAM;  Surgeon: Peter M Martinique, MD;  Location: [facility] CATH LAB;  Service: Cardiovascular;  Laterality: N/A;  . OVARIAN CYST REMOVAL    . perforation of colon    . renal RFA    . take down of colonostomy  2011  . TONSILLECTOMY      There were no vitals filed for this visit.      Subjective Assessment - 03/05/17 1030    Subjective I am a little achy this  morning.     How long can you sit comfortably? 1 hour increased pain   How long can you stand comfortably? 30 min in church   How long can you walk comfortably? not increase pain   Patient Stated Goals stronger muscles and less pain   Currently in Pain? Yes   Pain Score 6    Pain Location Back   Pain Orientation Right   Pain Descriptors / Indicators Aching   Pain Type Chronic pain   Pain Onset More than a month ago   Pain Frequency Intermittent   Aggravating Factors  sitting, standing, washing kitchen floor, bending over, working outside   Pain Relieving Factors heat   Multiple Pain Sites No            [facility] PT Assessment - 03/05/17 0001      Strength   Right Hip Flexion 4/5   Left Hip Flexion 4+/5   Right Knee Flexion 4/5   Right Knee Extension 4/5   Left Knee Flexion 4/5   Left Knee Extension 5/5                     [facility] Adult PT Treatment/Exercise - 03/05/17 0001      Lumbar Exercises: Stretches   Active Hamstring Stretch 2  reps;10 seconds  Leg lengthening     Lumbar Exercises: Aerobic   Stationary Bike Nustep L2 x 6 minutes  Therapipst present to discuss treatment     Lumbar Exercises: Seated   Sit to Stand 20 reps  green band on thighs   Sit to Stand Limitations rotate knees outward     Knee/Hip Exercises: Standing   Hip Abduction AROM;Both;1 set;10 reps   Hip Extension AROM;Both;10 reps   Other Standing Knee Exercises Weight shifting on blue mat  Varied BOS; eyes open and closed     Modalities   Modalities Electrical Stimulation;Moist Heat     Moist Heat Therapy   Number Minutes Moist Heat 15 Minutes   Moist Heat Location Lumbar Spine  left sidely     Electrical Stimulation   Electrical Stimulation Location Rt lumbar spine and Rt glutes  lfet sidely   Electrical Stimulation Action IFC   Electrical Stimulation Parameters 15 min, to patient tolerance   Electrical Stimulation Goals Pain                PT Education -  03/05/17 1059    Education provided Yes   Education Details balance   Person(s) Educated Patient   Methods Explanation   Comprehension Verbalized understanding          PT Short Term Goals - 02/26/17 1021      PT SHORT TERM GOAL #2   Title understand what postures to avoid to decrease spinal fractures.   Status Achieved     PT SHORT TERM GOAL #3   Title lumbar pain reduced >/= 25% due to improved posture and core strength   Baseline 60% reduction of sharp pains   Time --   Period --   Status Achieved     PT SHORT TERM GOAL #4   Title understand correct body mechanics to decreased strain on the lumbar spine   Status Achieved           PT Long Term Goals - 03/02/17 0935      PT LONG TERM GOAL #1   Title return demostation on correct lifting and perform daily activities with correct posture   Time 8   Period Weeks   Status On-going     PT LONG TERM GOAL #2   Title lumbar pain decreased >/= 50% due to understanding ways to manage pain   Baseline 60% reduction of sharp pains   Status Achieved               Plan - 03/05/17 1105    Clinical Impression Statement Patient is getting stronger.  Today we focused on her balance due to a near fall yesterday.  Patient has more back pain when waking up in the morning.  Patient has  done well with her home exercise program. Patient will benefit from slkilled therapy for strengthening and postural training.    Rehab Potential Excellent   Clinical Impairments Affecting Rehab Potential history of breast cancer; osteoporosis; scoliosis   PT Frequency 2x / week   PT Duration 8 weeks   PT Treatment/Interventions Cryotherapy;Electrical Stimulation;Moist Heat;Patient/family education;Neuromuscular re-education;Therapeutic exercise;Therapeutic activities;Manual techniques;Passive range of motion;Dry needling;Energy conservation;Taping   PT Next Visit Plan balance and strength; see if no soft tissue work at last visit was alright.  Discharge in 2 visits   PT Home Exercise Plan progress as tolerated   Recommended Other Services cert signed on 3/81/0175   Consulted and Agree with Plan of Care Patient  Patient will benefit from skilled therapeutic intervention in order to improve the following deficits and impairments:  Pain, Decreased mobility, Decreased strength, Decreased activity tolerance, Impaired flexibility, Increased muscle spasms, Increased fascial restricitons, Decreased range of motion  Visit Diagnosis: Muscle weakness (generalized)  Other idiopathic scoliosis, lumbar region  Chronic right-sided low back pain with bilateral sciatica     Problem List Patient Active Problem List   Diagnosis Date Noted  . Dyspnea on exertion 12/30/2013  . Chest pain on exertion 12/30/2013  . HTN (hypertension) 12/30/2013  . Hyperlipidemia 12/30/2013  . Rheumatoid arthritis(714.0)   . Hypothyroidism   . Breast cancer [facility])     Jeanie Sewer 03/05/2017, 11:55 AM  [facility] [address], [place], [place], [address] Phone: [phone]   Fax:  [phone]  Name: LATRICA CLOWERS MRN: [id number] Date of Birth: 11-Mar-1934

## 2017-03-05 NOTE — Therapy (Signed)
[facility] [address], [place], [place], [address] Phone: [phone]   Fax:  [phone]  Physical Therapy Treatment  Patient Details  Name: [name] MRN: [id number] Date of Birth: June 10, 1934 Referring Provider: Dr. Susa Day  Encounter Date: 03/05/2017      PT End of Session - 03/05/17 1058    Visit Number 12   Number of Visits 20   Date for PT Re-Evaluation 03/18/17   Authorization Type medicare g-code 10th visit; KX modifier 15th visit   PT Start Time 1015   PT Stop Time 1120   PT Time Calculation (min) 65 min   Activity Tolerance Patient tolerated treatment well   Behavior During Therapy [facility] for tasks assessed/performed      Past Medical History:  Diagnosis Date  . Abnormal cardiovascular stress test 12/2013   s/p cath with mild nonobstructive CAD normal EF - managed medically  . Breast cancer ([place]) 2008   RT, chemo, lumpectomy  . Diverticulitis   . DVT (deep vein thrombosis) in pregnancy [facility])    site of PICC catheter  . HTN (hypertension)   . Hyperlipidemia   . Hypothyroidism   . Kidney tumor 2010   RFA  . Prediabetes   . Rheumatoid arthritis(714.0)   . UTI (lower urinary tract infection)     Past Surgical History:  Procedure Laterality Date  . ABDOMINAL SURGERY     for ruptured diverticuli  . bilateral foot surgery    . BREAST LUMPECTOMY    . CARDIAC CATHETERIZATION    . CERVICAL SPINE SURGERY    . LEFT HEART CATHETERIZATION WITH CORONARY ANGIOGRAM N/A 01/17/2014   Procedure: LEFT HEART CATHETERIZATION WITH CORONARY ANGIOGRAM;  Surgeon: Peter M Martinique, MD;  Location: [facility] CATH LAB;  Service: Cardiovascular;  Laterality: N/A;  . OVARIAN CYST REMOVAL    . perforation of colon    . renal RFA    . take down of colonostomy  2011  . TONSILLECTOMY      There were no vitals filed for this visit.      Subjective Assessment - 03/05/17 1030    Subjective I am a little achy this  morning.     How long can you sit comfortably? 1 hour increased pain   How long can you stand comfortably? 30 min in church   How long can you walk comfortably? not increase pain   Patient Stated Goals stronger muscles and less pain   Currently in Pain? Yes   Pain Score 6    Pain Location Back   Pain Orientation Right   Pain Descriptors / Indicators Aching   Pain Type Chronic pain   Pain Onset More than a month ago   Pain Frequency Intermittent   Aggravating Factors  sitting, standing, washing kitchen floor, bending over, working outside   Pain Relieving Factors heat   Multiple Pain Sites No            [facility] PT Assessment - 03/05/17 0001      Strength   Right Hip Flexion 4/5   Left Hip Flexion 4+/5   Right Knee Flexion 4/5   Right Knee Extension 4/5   Left Knee Flexion 4/5   Left Knee Extension 5/5                     [facility] Adult PT Treatment/Exercise - 03/05/17 0001      Lumbar Exercises: Stretches   Active Hamstring Stretch 2  reps;10 seconds  Leg lengthening     Lumbar Exercises: Aerobic   Stationary Bike Nustep L2 x 6 minutes  Therapipst present to discuss treatment     Lumbar Exercises: Seated   Sit to Stand 20 reps  green band on thighs   Sit to Stand Limitations rotate knees outward     Knee/Hip Exercises: Standing   Hip Abduction AROM;Both;1 set;10 reps   Hip Extension AROM;Both;10 reps   Other Standing Knee Exercises Weight shifting on blue mat  Varied BOS; eyes open and closed     Modalities   Modalities Electrical Stimulation;Moist Heat     Moist Heat Therapy   Number Minutes Moist Heat 15 Minutes   Moist Heat Location Lumbar Spine  left sidely     Electrical Stimulation   Electrical Stimulation Location Rt lumbar spine and Rt glutes  lfet sidely   Electrical Stimulation Action IFC   Electrical Stimulation Parameters 15 min, to patient tolerance   Electrical Stimulation Goals Pain                PT Education -  03/05/17 1059    Education provided Yes   Education Details balance   Person(s) Educated Patient   Methods Explanation   Comprehension Verbalized understanding          PT Short Term Goals - 02/26/17 1021      PT SHORT TERM GOAL #2   Title understand what postures to avoid to decrease spinal fractures.   Status Achieved     PT SHORT TERM GOAL #3   Title lumbar pain reduced >/= 25% due to improved posture and core strength   Baseline 60% reduction of sharp pains   Time --   Period --   Status Achieved     PT SHORT TERM GOAL #4   Title understand correct body mechanics to decreased strain on the lumbar spine   Status Achieved           PT Long Term Goals - 03/02/17 0935      PT LONG TERM GOAL #1   Title return demostation on correct lifting and perform daily activities with correct posture   Time 8   Period Weeks   Status On-going     PT LONG TERM GOAL #2   Title lumbar pain decreased >/= 50% due to understanding ways to manage pain   Baseline 60% reduction of sharp pains   Status Achieved               Plan - 03/05/17 1105    Clinical Impression Statement Patient is getting stronger.  Today we focused on her balance due to a near fall yesterday.  Patient has more back pain when waking up in the morning.  Patient has  done well with her home exercise program. Patient will benefit from slkilled therapy for strengthening and postural training.    Rehab Potential Excellent   Clinical Impairments Affecting Rehab Potential history of breast cancer; osteoporosis; scoliosis   PT Frequency 2x / week   PT Duration 8 weeks   PT Treatment/Interventions Cryotherapy;Electrical Stimulation;Moist Heat;Patient/family education;Neuromuscular re-education;Therapeutic exercise;Therapeutic activities;Manual techniques;Passive range of motion;Dry needling;Energy conservation;Taping   PT Next Visit Plan balance and strength; see if no soft tissue work at last visit was alright.  Discharge in 2 visits   PT Home Exercise Plan progress as tolerated   Recommended Other Services cert signed on 2/84/1324   Consulted and Agree with Plan of Care Patient  Patient will benefit from skilled therapeutic intervention in order to improve the following deficits and impairments:  Pain, Decreased mobility, Decreased strength, Decreased activity tolerance, Impaired flexibility, Increased muscle spasms, Increased fascial restricitons, Decreased range of motion  Visit Diagnosis: Muscle weakness (generalized)  Other idiopathic scoliosis, lumbar region  Chronic right-sided low back pain with bilateral sciatica     Problem List Patient Active Problem List   Diagnosis Date Noted  . Dyspnea on exertion 12/30/2013  . Chest pain on exertion 12/30/2013  . HTN (hypertension) 12/30/2013  . Hyperlipidemia 12/30/2013  . Rheumatoid arthritis(714.0)   . Hypothyroidism   . Breast cancer [facility])     Earlie Counts, PT 03/05/17 11:09 AM   [place] [address] [place], [place], [address] Phone: [phone]   Fax:  [phone]  Name: [name] MRN: [id number] Date of Birth: 1933-10-24

## 2017-03-10 ENCOUNTER — Ambulatory Visit: Payer: Medicare Other | Admitting: Physical Therapy

## 2017-03-10 ENCOUNTER — Encounter: Payer: Self-pay | Admitting: Physical Therapy

## 2017-03-10 DIAGNOSIS — G8929 Other chronic pain: Secondary | ICD-10-CM | POA: Diagnosis not present

## 2017-03-10 DIAGNOSIS — M5441 Lumbago with sciatica, right side: Secondary | ICD-10-CM | POA: Diagnosis not present

## 2017-03-10 DIAGNOSIS — M5442 Lumbago with sciatica, left side: Secondary | ICD-10-CM | POA: Diagnosis not present

## 2017-03-10 DIAGNOSIS — M4126 Other idiopathic scoliosis, lumbar region: Secondary | ICD-10-CM | POA: Diagnosis not present

## 2017-03-10 DIAGNOSIS — M6281 Muscle weakness (generalized): Secondary | ICD-10-CM | POA: Diagnosis not present

## 2017-03-10 NOTE — Therapy (Signed)
[facility] [address] [place], [place], [address] Phone: [phone]   Fax:  [phone]  Physical Therapy Treatment  Patient Details  Name: [name] MRN: [id number] Date of Birth: August 07, 1934 Referring Provider: Dr. Susa Day  Encounter Date: 03/10/2017      PT End of Session - 03/10/17 1019    Visit Number 13   Number of Visits 20   Date for PT Re-Evaluation 03/18/17   Authorization Type medicare g-code 10th visit; KX modifier 15th visit   PT Start Time 1013   PT Stop Time 1110   PT Time Calculation (min) 57 min   Activity Tolerance Patient tolerated treatment well   Behavior During Therapy [facility] for tasks assessed/performed      Past Medical History:  Diagnosis Date  . Abnormal cardiovascular stress test 12/2013   s/p cath with mild nonobstructive CAD normal EF - managed medically  . Breast cancer ([place]) 2008   RT, chemo, lumpectomy  . Diverticulitis   . DVT (deep vein thrombosis) in pregnancy [facility])    site of PICC catheter  . HTN (hypertension)   . Hyperlipidemia   . Hypothyroidism   . Kidney tumor 2010   RFA  . Prediabetes   . Rheumatoid arthritis(714.0)   . UTI (lower urinary tract infection)     Past Surgical History:  Procedure Laterality Date  . ABDOMINAL SURGERY     for ruptured diverticuli  . bilateral foot surgery    . BREAST LUMPECTOMY    . CARDIAC CATHETERIZATION    . CERVICAL SPINE SURGERY    . LEFT HEART CATHETERIZATION WITH CORONARY ANGIOGRAM N/A 01/17/2014   Procedure: LEFT HEART CATHETERIZATION WITH CORONARY ANGIOGRAM;  Surgeon: Peter M Martinique, MD;  Location: [facility] CATH LAB;  Service: Cardiovascular;  Laterality: N/A;  . OVARIAN CYST REMOVAL    . perforation of colon    . renal RFA    . take down of colonostomy  2011  . TONSILLECTOMY      There were no vitals filed for this visit.      Subjective Assessment - 03/10/17 1016    Subjective Yesterday was rough, I was  changing all of my clothes out and putting them into the attic.    How long can you sit comfortably? 1 hour increased pain   How long can you stand comfortably? 30 min in church   How long can you walk comfortably? not increase pain   Patient Stated Goals stronger muscles and less pain   Currently in Pain? Yes   Pain Score 5    Pain Location Back   Pain Orientation Right   Pain Descriptors / Indicators Aching   Pain Type Chronic pain   Pain Onset More than a month ago   Pain Frequency Intermittent                         [facility] Adult PT Treatment/Exercise - 03/10/17 0001      Exercises   Exercises (P)  Other Exercises   Other Exercises  (P)  Red ball diagonals      Lumbar Exercises: Stretches   Active Hamstring Stretch 2 reps;10 seconds  Leg lengthening   Piriformis Stretch 2 reps;10 seconds     Lumbar Exercises: Aerobic   Stationary Bike Nustep L2 x 8 minutes  Therapipst present to discuss treatment     Lumbar Exercises: Seated   Sit to Stand 20 reps  green band on thighs     Knee/Hip Exercises: Standing   Hip Abduction AROM;Both;1 set;10 reps   Hip Extension AROM;Both;10 reps   Other Standing Knee Exercises Weight shifting on blue mat  Varied BOS; eyes open and closed     Modalities   Modalities Electrical Stimulation;Moist Heat     Moist Heat Therapy   Number Minutes Moist Heat 15 Minutes   Moist Heat Location Lumbar Spine  left sidely     Electrical Stimulation   Electrical Stimulation Location Rt lumbar spine and Rt glutes  lfet sidely   Electrical Stimulation Action IFC   Electrical Stimulation Parameters 15 minutes to patient tolerance   Electrical Stimulation Goals Pain                  PT Short Term Goals - 02/26/17 1021      PT SHORT TERM GOAL #2   Title understand what postures to avoid to decrease spinal fractures.   Status Achieved     PT SHORT TERM GOAL #3   Title lumbar pain reduced >/= 25% due to improved  posture and core strength   Baseline 60% reduction of sharp pains   Time --   Period --   Status Achieved     PT SHORT TERM GOAL #4   Title understand correct body mechanics to decreased strain on the lumbar spine   Status Achieved           PT Long Term Goals - 03/10/17 1019      PT LONG TERM GOAL #3   Title understand home exercise program for trunk and extremity strengthening with correct postures   Time 8   Period Weeks   Status On-going     PT LONG TERM GOAL #4   Title FOTO score </= 47% limitation   Time 8   Period Weeks   Status On-going               Plan - 03/10/17 1107    Clinical Impression Statement Patient did well with all balance and strengthening exercises today. Conitnues to have some back pain needing verbal cues to abdominal brace and good posture which helps to relieve back pain during activities. Patient responded well to stretches for hip and back. Patient will continue to benefit from skilled thearpy for core strengthening and balance.    Rehab Potential Excellent   Clinical Impairments Affecting Rehab Potential history of breast cancer; osteoporosis; scoliosis   PT Frequency 2x / week   PT Duration 8 weeks   PT Next Visit Plan balance and strength; Discharge next visit   Consulted and Agree with Plan of Care Patient      Patient will benefit from skilled therapeutic intervention in order to improve the following deficits and impairments:  Pain, Decreased mobility, Decreased strength, Decreased activity tolerance, Impaired flexibility, Increased muscle spasms, Increased fascial restricitons, Decreased range of motion  Visit Diagnosis: Muscle weakness (generalized)  Other idiopathic scoliosis, lumbar region  Chronic right-sided low back pain with bilateral sciatica     Problem List Patient Active Problem List   Diagnosis Date Noted  . Dyspnea on exertion 12/30/2013  . Chest pain on exertion 12/30/2013  . HTN (hypertension)  12/30/2013  . Hyperlipidemia 12/30/2013  . Rheumatoid arthritis(714.0)   . Hypothyroidism   . Breast cancer [facility])     Jeanie Sewer PTA 03/10/2017, 11:29 AM  [facility] [address], [place], [place], [address] Phone:  [phone]   Fax:  [phone]  Name: [name] MRN: [id number] Date of Birth: 01/30/1934

## 2017-03-12 ENCOUNTER — Ambulatory Visit: Payer: Medicare Other | Admitting: Physical Therapy

## 2017-03-12 ENCOUNTER — Encounter: Payer: Self-pay | Admitting: Physical Therapy

## 2017-03-12 DIAGNOSIS — M5441 Lumbago with sciatica, right side: Secondary | ICD-10-CM | POA: Diagnosis not present

## 2017-03-12 DIAGNOSIS — M6281 Muscle weakness (generalized): Secondary | ICD-10-CM | POA: Diagnosis not present

## 2017-03-12 DIAGNOSIS — M4126 Other idiopathic scoliosis, lumbar region: Secondary | ICD-10-CM | POA: Diagnosis not present

## 2017-03-12 DIAGNOSIS — G8929 Other chronic pain: Secondary | ICD-10-CM

## 2017-03-12 DIAGNOSIS — M5442 Lumbago with sciatica, left side: Secondary | ICD-10-CM

## 2017-03-12 NOTE — Patient Instructions (Addendum)
Heat Therapy Heat therapy can help ease sore, stiff, injured, and tight muscles and joints. Heat relaxes your muscles, which may help ease your pain. Heat therapy should only be used on old, pre-existing, or long-lasting (chronic) injuries. Do not use heat therapy unless told by your doctor. How to use heat therapy There are several different kinds of heat therapy, including:  Moist heat pack.  Warm water bath.  Hot water bottle.  Electric heating pad.  Heated gel pack.  Heated wrap.  Electric heating pad.  General heat therapy recommendations  Do not sleep while using heat therapy. Only use heat therapy while you are awake.  Your skin may turn pink while using heat therapy. Do not use heat therapy if your skin turns red.  Do not use heat therapy if you have new pain.  High heat or long exposure to heat can cause burns. Be careful when using heat therapy to avoid burning your skin.  Do not use heat therapy on areas of your skin that are already irritated, such as with a rash or sunburn.  Do not place on mid back and up Get help if:  You have blisters, redness, swelling (puffiness), or numbness.  You have new pain.  Your pain is worse. This information is not intended to replace advice given to you by your health care provider. Make sure you discuss any questions you have with your health care provider. Document Released: 12/22/2011 Document Revised: 03/06/2016 Document Reviewed: 11/22/2013 Elsevier Interactive Patient Education  2018 [place] Sitting    Sit at edge of seat, spine straight, one leg extended. Put a hand on each thigh and bend forward from the hip, keeping spine straight. Allow hand on extended leg to reach toward toes. Support upper body with other arm. Hold _30__ seconds. Repeat _2__ times per session. Do 1___ sessions per day.  Copyright  VHI. All rights reserved.  Piriformis Stretch, Sitting    Sit, one ankle on opposite knee,  same-side hand on crossed knee. Push down on knee, keeping spine straight. Lean torso forward, with flat back, until tension is felt in hamstrings and gluteals of crossed-leg side. Hold _30__ seconds.  Repeat __2_ times per session. Do _1__ sessions per day.  Copyright  VHI. All rights reserved.  [facility] [address] [place], [place] [address] Phone # [phone] Fax [phone]

## 2017-03-12 NOTE — Therapy (Signed)
[facility] [address] [place], [place], [address] Phone: [phone]   Fax:  [phone]  Physical Therapy Treatment  Patient Details  Name: [name] MRN: [id number] Date of Birth: 12/03/1933 Referring Provider: Dr. Jeffrey Beane  Encounter Date: 03/12/2017      PT End of Session - 03/12/17 1223    Visit Number 14   Date for PT Re-Evaluation 03/18/17   Authorization Type medicare g-code 10th visit; KX modifier 15th visit   PT Start Time 1145   PT Stop Time 1224   PT Time Calculation (min) 39 min   Activity Tolerance Patient tolerated treatment well   Behavior During Therapy WFL for tasks assessed/performed      Past Medical History:  Diagnosis Date  . Abnormal cardiovascular stress test 12/2013   s/p cath with mild nonobstructive CAD normal EF - managed medically  . Breast cancer ([facility]) 2008   RT, chemo, lumpectomy  . Diverticulitis   . DVT (deep vein thrombosis) in pregnancy ([facility])    site of PICC catheter  . HTN (hypertension)   . Hyperlipidemia   . Hypothyroidism   . Kidney tumor 2010   RFA  . Prediabetes   . Rheumatoid arthritis(714.0)   . UTI (lower urinary tract infection)     Past Surgical History:  Procedure Laterality Date  . ABDOMINAL SURGERY     for ruptured diverticuli  . bilateral foot surgery    . BREAST LUMPECTOMY    . CARDIAC CATHETERIZATION    . CERVICAL SPINE SURGERY    . LEFT HEART CATHETERIZATION WITH CORONARY ANGIOGRAM N/A 01/17/2014   Procedure: LEFT HEART CATHETERIZATION WITH CORONARY ANGIOGRAM;  Surgeon: Peter M Jordan, MD;  Location: [facility] CATH LAB;  Service: Cardiovascular;  Laterality: N/A;  . OVARIAN CYST REMOVAL    . perforation of colon    . renal RFA    . take down of colonostomy  2011  . TONSILLECTOMY      There were no vitals filed for this visit.          [facility] PT Assessment - 03/12/17 0001      Assessment   Medical Diagnosis M51.36 Lumbar DDD:  M41.26 Other idiopathic scoliosis   Referring Provider Dr. Jeffrey Beane   Onset Date/Surgical Date 08/13/16   Prior Therapy Yes in 01/2015     Precautions   Precautions Other (comment)   Precaution Comments osteoporosis; breast cancer     Restrictions   Weight Bearing Restrictions No     Balance Screen   Has the patient fallen in the past 6 months No   Has the patient had a decrease in activity level because of a fear of falling?  No   Is the patient reluctant to leave their home because of a fear of falling?  No     Home Environment   Living Environment Private residence     Prior Function   Level of Independence Independent   Vocation Retired   Leisure outside work; walk daily 1/2 mile     Cognition   Overall Cognitive Status Within Functional Limits for tasks assessed     Observation/Other Assessments   Focus on Therapeutic Outcomes (FOTO)  46% limitation  goal is 47% limitation     Posture/Postural Control   Posture/Postural Control Postural limitations   Postural Limitations Increased thoracic kyphosis;Decreased lumbar lordosis;Forward head;Rounded Shoulders   Posture Comments concave thoracic curve to right, lumbar concave curve to the left       Strength   Right Hip Flexion 5/5   Left Hip Flexion 5/5   Right Knee Flexion 5/5   Right Knee Extension 5/5   Left Knee Flexion 5/5     Transfers   Transfers Not assessed     Ambulation/Gait   Ambulation/Gait No                     [facility] Adult PT Treatment/Exercise - 03/12/17 0001      Self-Care   Self-Care Heat/Ice Application   Heat/Ice Application when has pain use for 20 min     Lumbar Exercises: Stretches   Active Hamstring Stretch 2 reps;10 seconds  Leg lengthening     Manual Therapy   Manual Therapy Soft tissue mobilization   Soft tissue mobilization right quadratus, right and left thoracic lumbar paraspinals, right glute med  left sidely                PT Education -  03/12/17 1223    Education provided Yes   Education Details how to use a hot pack, stretches   Person(s) Educated Patient   Methods Explanation;Demonstration;Verbal cues;Handout   Comprehension Returned demonstration;Verbalized understanding          PT Short Term Goals - 02/26/17 1021      PT SHORT TERM GOAL #2   Title understand what postures to avoid to decrease spinal fractures.   Status Achieved     PT SHORT TERM GOAL #3   Title lumbar pain reduced >/= 25% due to improved posture and core strength   Baseline 60% reduction of sharp pains   Time --   Period --   Status Achieved     PT SHORT TERM GOAL #4   Title understand correct body mechanics to decreased strain on the lumbar spine   Status Achieved           PT Long Term Goals - 03/12/17 1226      PT LONG TERM GOAL #1   Title return demostation on correct lifting and perform daily activities with correct posture   Time 8   Period Weeks   Status Achieved     PT LONG TERM GOAL #2   Title lumbar pain decreased >/= 50% due to understanding ways to manage pain   Time 8   Period Weeks   Status Achieved     PT LONG TERM GOAL #3   Title understand home exercise program for trunk and extremity strengthening with correct postures   Time 8   Period Weeks   Status On-going     PT LONG TERM GOAL #4   Title FOTO score </= 47% limitation   Time 8   Period Weeks   Status Achieved               Plan - 03/12/17 1143    Clinical Impression Statement When patient gets up from a chair she does not feel she is going to fall. Patietn has increased strength in lower extremities.  Patient has met her goals.  Patient understands how to manage her back pain with heat, laying down, and correct body mechanincs. Patient is indepedent with HEP.  Patient reports her back pain is 60% better. Paitent is ready for discharge.    Rehab Potential Excellent   Clinical Impairments Affecting Rehab Potential history of breast  cancer; osteoporosis; scoliosis   PT Frequency 2x / week   PT Duration 8 weeks   PT Next Visit Plan Discharge  to HEP this visit   PT Home Exercise Plan Current HEP   Consulted and Agree with Plan of Care Patient      Patient will benefit from skilled therapeutic intervention in order to improve the following deficits and impairments:  Pain, Decreased mobility, Decreased strength, Decreased activity tolerance, Impaired flexibility, Increased muscle spasms, Increased fascial restricitons, Decreased range of motion  Visit Diagnosis: Muscle weakness (generalized)  Other idiopathic scoliosis, lumbar region  Chronic right-sided low back pain with bilateral sciatica       G-Codes - 03/26/17 1143    Functional Assessment Tool Used (Outpatient Only) FOTO score is 46% limitation   Functional Limitation Other PT primary   Other PT Primary Goal Status (X8338) At least 40 percent but less than 60 percent impaired, limited or restricted   Other PT Primary Discharge Status (S5053) At least 40 percent but less than 60 percent impaired, limited or restricted      Problem List Patient Active Problem List   Diagnosis Date Noted  . Dyspnea on exertion 12/30/2013  . Chest pain on exertion 12/30/2013  . HTN (hypertension) 12/30/2013  . Hyperlipidemia 12/30/2013  . Rheumatoid arthritis(714.0)   . Hypothyroidism   . Breast cancer [facility])     Earlie Counts, PT 2017/03/26 12:30 PM   [facility] [address] [place], [place], [address] Phone: [phone]   Fax:  [phone]  Name: [name] MRN: [id number] Date of Birth: 23-Oct-1933  PHYSICAL THERAPY DISCHARGE SUMMARY  Visits from Start of Care: 14  Current functional level related to goals / functional outcomes: See above.   Remaining deficits: See above   Education / Equipment: HEP Plan: Patient agrees to discharge.  Patient goals were met. Patient is being  discharged due to meeting the stated rehab goals.thank you for the referral. Earlie Counts, PT 03/26/17 12:30 PM    ?????

## 2017-03-24 DIAGNOSIS — L97521 Non-pressure chronic ulcer of other part of left foot limited to breakdown of skin: Secondary | ICD-10-CM | POA: Diagnosis not present

## 2017-03-24 DIAGNOSIS — L97511 Non-pressure chronic ulcer of other part of right foot limited to breakdown of skin: Secondary | ICD-10-CM | POA: Diagnosis not present

## 2017-04-03 ENCOUNTER — Other Ambulatory Visit: Payer: Self-pay | Admitting: Family Medicine

## 2017-04-03 DIAGNOSIS — Z1231 Encounter for screening mammogram for malignant neoplasm of breast: Secondary | ICD-10-CM

## 2017-04-21 DIAGNOSIS — L97511 Non-pressure chronic ulcer of other part of right foot limited to breakdown of skin: Secondary | ICD-10-CM | POA: Diagnosis not present

## 2017-04-21 DIAGNOSIS — L97521 Non-pressure chronic ulcer of other part of left foot limited to breakdown of skin: Secondary | ICD-10-CM | POA: Diagnosis not present

## 2017-05-19 DIAGNOSIS — M65871 Other synovitis and tenosynovitis, right ankle and foot: Secondary | ICD-10-CM | POA: Diagnosis not present

## 2017-05-19 DIAGNOSIS — M65872 Other synovitis and tenosynovitis, left ankle and foot: Secondary | ICD-10-CM | POA: Diagnosis not present

## 2017-05-19 DIAGNOSIS — L97511 Non-pressure chronic ulcer of other part of right foot limited to breakdown of skin: Secondary | ICD-10-CM | POA: Diagnosis not present

## 2017-05-19 DIAGNOSIS — L97521 Non-pressure chronic ulcer of other part of left foot limited to breakdown of skin: Secondary | ICD-10-CM | POA: Diagnosis not present

## 2017-05-27 DIAGNOSIS — Z6825 Body mass index (BMI) 25.0-25.9, adult: Secondary | ICD-10-CM | POA: Diagnosis not present

## 2017-05-27 DIAGNOSIS — E663 Overweight: Secondary | ICD-10-CM | POA: Diagnosis not present

## 2017-05-27 DIAGNOSIS — M2550 Pain in unspecified joint: Secondary | ICD-10-CM | POA: Diagnosis not present

## 2017-05-27 DIAGNOSIS — M0579 Rheumatoid arthritis with rheumatoid factor of multiple sites without organ or systems involvement: Secondary | ICD-10-CM | POA: Diagnosis not present

## 2017-05-27 DIAGNOSIS — Z79899 Other long term (current) drug therapy: Secondary | ICD-10-CM | POA: Diagnosis not present

## 2017-06-11 DIAGNOSIS — Z85828 Personal history of other malignant neoplasm of skin: Secondary | ICD-10-CM | POA: Diagnosis not present

## 2017-06-11 DIAGNOSIS — L821 Other seborrheic keratosis: Secondary | ICD-10-CM | POA: Diagnosis not present

## 2017-06-16 DIAGNOSIS — L97521 Non-pressure chronic ulcer of other part of left foot limited to breakdown of skin: Secondary | ICD-10-CM | POA: Diagnosis not present

## 2017-06-23 DIAGNOSIS — M5136 Other intervertebral disc degeneration, lumbar region: Secondary | ICD-10-CM | POA: Diagnosis not present

## 2017-06-23 DIAGNOSIS — M4126 Other idiopathic scoliosis, lumbar region: Secondary | ICD-10-CM | POA: Diagnosis not present

## 2017-06-23 DIAGNOSIS — M5127 Other intervertebral disc displacement, lumbosacral region: Secondary | ICD-10-CM | POA: Diagnosis not present

## 2017-06-29 ENCOUNTER — Ambulatory Visit: Payer: Medicare Other

## 2017-07-14 DIAGNOSIS — L97521 Non-pressure chronic ulcer of other part of left foot limited to breakdown of skin: Secondary | ICD-10-CM | POA: Diagnosis not present

## 2017-07-15 ENCOUNTER — Ambulatory Visit
Admission: RE | Admit: 2017-07-15 | Discharge: 2017-07-15 | Disposition: A | Discharge status: Home / Self Care | Payer: Medicare Other | Source: Ambulatory Visit | Attending: Family Medicine | Admitting: Family Medicine

## 2017-07-15 DIAGNOSIS — Z1231 Encounter for screening mammogram for malignant neoplasm of breast: Secondary | ICD-10-CM | POA: Diagnosis not present

## 2017-07-15 HISTORY — DX: Personal history of irradiation: Z92.3

## 2017-07-15 HISTORY — DX: Personal history of antineoplastic chemotherapy: Z92.21

## 2017-07-24 DIAGNOSIS — Z23 Encounter for immunization: Secondary | ICD-10-CM | POA: Diagnosis not present

## 2017-07-27 DIAGNOSIS — M545 Low back pain: Secondary | ICD-10-CM | POA: Diagnosis not present

## 2017-07-27 DIAGNOSIS — E039 Hypothyroidism, unspecified: Secondary | ICD-10-CM | POA: Diagnosis not present

## 2017-07-27 DIAGNOSIS — Z0000 Encounter for general adult medical examination without abnormal findings: Secondary | ICD-10-CM | POA: Diagnosis not present

## 2017-07-27 DIAGNOSIS — F419 Anxiety disorder, unspecified: Secondary | ICD-10-CM | POA: Diagnosis not present

## 2017-07-27 DIAGNOSIS — H9190 Unspecified hearing loss, unspecified ear: Secondary | ICD-10-CM | POA: Diagnosis not present

## 2017-07-27 DIAGNOSIS — M810 Age-related osteoporosis without current pathological fracture: Secondary | ICD-10-CM | POA: Diagnosis not present

## 2017-07-27 DIAGNOSIS — I2510 Atherosclerotic heart disease of native coronary artery without angina pectoris: Secondary | ICD-10-CM | POA: Diagnosis not present

## 2017-07-27 DIAGNOSIS — E7800 Pure hypercholesterolemia, unspecified: Secondary | ICD-10-CM | POA: Diagnosis not present

## 2017-07-27 DIAGNOSIS — I10 Essential (primary) hypertension: Secondary | ICD-10-CM | POA: Diagnosis not present

## 2017-07-27 DIAGNOSIS — R7303 Prediabetes: Secondary | ICD-10-CM | POA: Diagnosis not present

## 2017-07-27 DIAGNOSIS — M0579 Rheumatoid arthritis with rheumatoid factor of multiple sites without organ or systems involvement: Secondary | ICD-10-CM | POA: Diagnosis not present

## 2017-08-07 DIAGNOSIS — H903 Sensorineural hearing loss, bilateral: Secondary | ICD-10-CM | POA: Diagnosis not present

## 2017-08-07 DIAGNOSIS — H6123 Impacted cerumen, bilateral: Secondary | ICD-10-CM | POA: Diagnosis not present

## 2017-08-07 DIAGNOSIS — Z7289 Other problems related to lifestyle: Secondary | ICD-10-CM | POA: Diagnosis not present

## 2017-08-11 DIAGNOSIS — M5136 Other intervertebral disc degeneration, lumbar region: Secondary | ICD-10-CM | POA: Diagnosis not present

## 2017-08-11 DIAGNOSIS — M5127 Other intervertebral disc displacement, lumbosacral region: Secondary | ICD-10-CM | POA: Diagnosis not present

## 2017-08-18 DIAGNOSIS — L97511 Non-pressure chronic ulcer of other part of right foot limited to breakdown of skin: Secondary | ICD-10-CM | POA: Diagnosis not present

## 2017-08-18 DIAGNOSIS — L97521 Non-pressure chronic ulcer of other part of left foot limited to breakdown of skin: Secondary | ICD-10-CM | POA: Diagnosis not present

## 2017-08-26 DIAGNOSIS — Z8489 Family history of other specified conditions: Secondary | ICD-10-CM | POA: Diagnosis not present

## 2017-08-26 DIAGNOSIS — K219 Gastro-esophageal reflux disease without esophagitis: Secondary | ICD-10-CM | POA: Diagnosis not present

## 2017-08-26 DIAGNOSIS — M810 Age-related osteoporosis without current pathological fracture: Secondary | ICD-10-CM | POA: Diagnosis not present

## 2017-08-27 DIAGNOSIS — Z6825 Body mass index (BMI) 25.0-25.9, adult: Secondary | ICD-10-CM | POA: Diagnosis not present

## 2017-08-27 DIAGNOSIS — Z79899 Other long term (current) drug therapy: Secondary | ICD-10-CM | POA: Diagnosis not present

## 2017-08-27 DIAGNOSIS — M0579 Rheumatoid arthritis with rheumatoid factor of multiple sites without organ or systems involvement: Secondary | ICD-10-CM | POA: Diagnosis not present

## 2017-08-27 DIAGNOSIS — M2550 Pain in unspecified joint: Secondary | ICD-10-CM | POA: Diagnosis not present

## 2017-08-27 DIAGNOSIS — E663 Overweight: Secondary | ICD-10-CM | POA: Diagnosis not present

## 2017-09-29 DIAGNOSIS — L97521 Non-pressure chronic ulcer of other part of left foot limited to breakdown of skin: Secondary | ICD-10-CM | POA: Diagnosis not present

## 2017-09-29 DIAGNOSIS — L97511 Non-pressure chronic ulcer of other part of right foot limited to breakdown of skin: Secondary | ICD-10-CM | POA: Diagnosis not present

## 2017-10-14 DIAGNOSIS — D1801 Hemangioma of skin and subcutaneous tissue: Secondary | ICD-10-CM | POA: Diagnosis not present

## 2017-10-14 DIAGNOSIS — L821 Other seborrheic keratosis: Secondary | ICD-10-CM | POA: Diagnosis not present

## 2017-10-14 DIAGNOSIS — L570 Actinic keratosis: Secondary | ICD-10-CM | POA: Diagnosis not present

## 2017-10-14 DIAGNOSIS — B372 Candidiasis of skin and nail: Secondary | ICD-10-CM | POA: Diagnosis not present

## 2017-10-14 DIAGNOSIS — Z85828 Personal history of other malignant neoplasm of skin: Secondary | ICD-10-CM | POA: Diagnosis not present

## 2017-10-27 DIAGNOSIS — L97511 Non-pressure chronic ulcer of other part of right foot limited to breakdown of skin: Secondary | ICD-10-CM | POA: Diagnosis not present

## 2017-10-27 DIAGNOSIS — L97521 Non-pressure chronic ulcer of other part of left foot limited to breakdown of skin: Secondary | ICD-10-CM | POA: Diagnosis not present

## 2017-11-24 DIAGNOSIS — L97521 Non-pressure chronic ulcer of other part of left foot limited to breakdown of skin: Secondary | ICD-10-CM | POA: Diagnosis not present

## 2017-11-27 DIAGNOSIS — Z79899 Other long term (current) drug therapy: Secondary | ICD-10-CM | POA: Diagnosis not present

## 2017-11-27 DIAGNOSIS — Z6825 Body mass index (BMI) 25.0-25.9, adult: Secondary | ICD-10-CM | POA: Diagnosis not present

## 2017-11-27 DIAGNOSIS — M2550 Pain in unspecified joint: Secondary | ICD-10-CM | POA: Diagnosis not present

## 2017-11-27 DIAGNOSIS — M0579 Rheumatoid arthritis with rheumatoid factor of multiple sites without organ or systems involvement: Secondary | ICD-10-CM | POA: Diagnosis not present

## 2017-11-27 DIAGNOSIS — E663 Overweight: Secondary | ICD-10-CM | POA: Diagnosis not present

## 2017-12-22 DIAGNOSIS — H0100B Unspecified blepharitis left eye, upper and lower eyelids: Secondary | ICD-10-CM | POA: Diagnosis not present

## 2017-12-22 DIAGNOSIS — H04122 Dry eye syndrome of left lacrimal gland: Secondary | ICD-10-CM | POA: Diagnosis not present

## 2017-12-29 DIAGNOSIS — L97511 Non-pressure chronic ulcer of other part of right foot limited to breakdown of skin: Secondary | ICD-10-CM | POA: Diagnosis not present

## 2018-01-04 DIAGNOSIS — Z961 Presence of intraocular lens: Secondary | ICD-10-CM | POA: Diagnosis not present

## 2018-01-04 DIAGNOSIS — H10413 Chronic giant papillary conjunctivitis, bilateral: Secondary | ICD-10-CM | POA: Diagnosis not present

## 2018-01-04 DIAGNOSIS — H0100B Unspecified blepharitis left eye, upper and lower eyelids: Secondary | ICD-10-CM | POA: Diagnosis not present

## 2018-01-04 DIAGNOSIS — H5203 Hypermetropia, bilateral: Secondary | ICD-10-CM | POA: Diagnosis not present

## 2018-01-25 DIAGNOSIS — E039 Hypothyroidism, unspecified: Secondary | ICD-10-CM | POA: Diagnosis not present

## 2018-01-25 DIAGNOSIS — E7800 Pure hypercholesterolemia, unspecified: Secondary | ICD-10-CM | POA: Diagnosis not present

## 2018-01-25 DIAGNOSIS — I10 Essential (primary) hypertension: Secondary | ICD-10-CM | POA: Diagnosis not present

## 2018-01-25 DIAGNOSIS — H9190 Unspecified hearing loss, unspecified ear: Secondary | ICD-10-CM | POA: Diagnosis not present

## 2018-01-25 DIAGNOSIS — R7303 Prediabetes: Secondary | ICD-10-CM | POA: Diagnosis not present

## 2018-01-25 DIAGNOSIS — F419 Anxiety disorder, unspecified: Secondary | ICD-10-CM | POA: Diagnosis not present

## 2018-01-25 DIAGNOSIS — I2510 Atherosclerotic heart disease of native coronary artery without angina pectoris: Secondary | ICD-10-CM | POA: Diagnosis not present

## 2018-01-25 DIAGNOSIS — M0579 Rheumatoid arthritis with rheumatoid factor of multiple sites without organ or systems involvement: Secondary | ICD-10-CM | POA: Diagnosis not present

## 2018-01-26 DIAGNOSIS — L84 Corns and callosities: Secondary | ICD-10-CM | POA: Diagnosis not present

## 2018-01-26 DIAGNOSIS — I739 Peripheral vascular disease, unspecified: Secondary | ICD-10-CM | POA: Diagnosis not present

## 2018-02-23 DIAGNOSIS — M65871 Other synovitis and tenosynovitis, right ankle and foot: Secondary | ICD-10-CM | POA: Diagnosis not present

## 2018-02-23 DIAGNOSIS — M65872 Other synovitis and tenosynovitis, left ankle and foot: Secondary | ICD-10-CM | POA: Diagnosis not present

## 2018-02-26 DIAGNOSIS — Z79899 Other long term (current) drug therapy: Secondary | ICD-10-CM | POA: Diagnosis not present

## 2018-02-26 DIAGNOSIS — M0579 Rheumatoid arthritis with rheumatoid factor of multiple sites without organ or systems involvement: Secondary | ICD-10-CM | POA: Diagnosis not present

## 2018-02-26 DIAGNOSIS — Z85828 Personal history of other malignant neoplasm of skin: Secondary | ICD-10-CM | POA: Diagnosis not present

## 2018-02-26 DIAGNOSIS — L821 Other seborrheic keratosis: Secondary | ICD-10-CM | POA: Diagnosis not present

## 2018-02-26 DIAGNOSIS — M2550 Pain in unspecified joint: Secondary | ICD-10-CM | POA: Diagnosis not present

## 2018-02-26 DIAGNOSIS — Z6824 Body mass index (BMI) 24.0-24.9, adult: Secondary | ICD-10-CM | POA: Diagnosis not present

## 2018-03-03 DIAGNOSIS — M810 Age-related osteoporosis without current pathological fracture: Secondary | ICD-10-CM | POA: Diagnosis not present

## 2018-03-23 DIAGNOSIS — L97522 Non-pressure chronic ulcer of other part of left foot with fat layer exposed: Secondary | ICD-10-CM | POA: Diagnosis not present

## 2018-03-23 DIAGNOSIS — H43812 Vitreous degeneration, left eye: Secondary | ICD-10-CM | POA: Diagnosis not present

## 2018-04-06 DIAGNOSIS — H43812 Vitreous degeneration, left eye: Secondary | ICD-10-CM | POA: Diagnosis not present

## 2018-04-20 DIAGNOSIS — L97522 Non-pressure chronic ulcer of other part of left foot with fat layer exposed: Secondary | ICD-10-CM | POA: Diagnosis not present

## 2018-04-20 DIAGNOSIS — L97512 Non-pressure chronic ulcer of other part of right foot with fat layer exposed: Secondary | ICD-10-CM | POA: Diagnosis not present

## 2018-05-04 ENCOUNTER — Other Ambulatory Visit: Payer: Self-pay | Admitting: Family Medicine

## 2018-05-04 DIAGNOSIS — Z1231 Encounter for screening mammogram for malignant neoplasm of breast: Secondary | ICD-10-CM

## 2018-05-18 DIAGNOSIS — L97512 Non-pressure chronic ulcer of other part of right foot with fat layer exposed: Secondary | ICD-10-CM | POA: Diagnosis not present

## 2018-05-27 DIAGNOSIS — M0579 Rheumatoid arthritis with rheumatoid factor of multiple sites without organ or systems involvement: Secondary | ICD-10-CM | POA: Diagnosis not present

## 2018-05-27 DIAGNOSIS — M2550 Pain in unspecified joint: Secondary | ICD-10-CM | POA: Diagnosis not present

## 2018-05-27 DIAGNOSIS — Z79899 Other long term (current) drug therapy: Secondary | ICD-10-CM | POA: Diagnosis not present

## 2018-05-27 DIAGNOSIS — Z6824 Body mass index (BMI) 24.0-24.9, adult: Secondary | ICD-10-CM | POA: Diagnosis not present

## 2018-06-15 DIAGNOSIS — L97522 Non-pressure chronic ulcer of other part of left foot with fat layer exposed: Secondary | ICD-10-CM | POA: Diagnosis not present

## 2018-07-05 DIAGNOSIS — R6884 Jaw pain: Secondary | ICD-10-CM | POA: Diagnosis not present

## 2018-07-13 DIAGNOSIS — L97522 Non-pressure chronic ulcer of other part of left foot with fat layer exposed: Secondary | ICD-10-CM | POA: Diagnosis not present

## 2018-07-16 ENCOUNTER — Ambulatory Visit
Admission: RE | Admit: 2018-07-16 | Discharge: 2018-07-16 | Disposition: A | Discharge status: Home / Self Care | Payer: Medicare Other | Source: Ambulatory Visit | Attending: Family Medicine | Admitting: Family Medicine

## 2018-07-16 DIAGNOSIS — Z1231 Encounter for screening mammogram for malignant neoplasm of breast: Secondary | ICD-10-CM

## 2018-07-19 ENCOUNTER — Other Ambulatory Visit: Payer: Self-pay | Admitting: Family Medicine

## 2018-07-19 DIAGNOSIS — R928 Other abnormal and inconclusive findings on diagnostic imaging of breast: Secondary | ICD-10-CM

## 2018-07-22 ENCOUNTER — Ambulatory Visit
Admission: RE | Admit: 2018-07-22 | Discharge: 2018-07-22 | Disposition: A | Discharge status: Home / Self Care | Payer: Medicare Other | Source: Ambulatory Visit | Attending: Family Medicine | Admitting: Family Medicine

## 2018-07-22 ENCOUNTER — Ambulatory Visit: Payer: Medicare Other

## 2018-07-22 DIAGNOSIS — R928 Other abnormal and inconclusive findings on diagnostic imaging of breast: Secondary | ICD-10-CM

## 2018-07-22 DIAGNOSIS — Z23 Encounter for immunization: Secondary | ICD-10-CM | POA: Diagnosis not present

## 2018-08-10 DIAGNOSIS — M5136 Other intervertebral disc degeneration, lumbar region: Secondary | ICD-10-CM | POA: Insufficient documentation

## 2018-08-12 DIAGNOSIS — L97522 Non-pressure chronic ulcer of other part of left foot with fat layer exposed: Secondary | ICD-10-CM | POA: Diagnosis not present

## 2018-08-18 DIAGNOSIS — M810 Age-related osteoporosis without current pathological fracture: Secondary | ICD-10-CM | POA: Diagnosis not present

## 2018-08-18 DIAGNOSIS — I10 Essential (primary) hypertension: Secondary | ICD-10-CM | POA: Diagnosis not present

## 2018-08-18 DIAGNOSIS — F419 Anxiety disorder, unspecified: Secondary | ICD-10-CM | POA: Diagnosis not present

## 2018-08-18 DIAGNOSIS — Z0000 Encounter for general adult medical examination without abnormal findings: Secondary | ICD-10-CM | POA: Diagnosis not present

## 2018-08-18 DIAGNOSIS — E7800 Pure hypercholesterolemia, unspecified: Secondary | ICD-10-CM | POA: Diagnosis not present

## 2018-08-18 DIAGNOSIS — I2510 Atherosclerotic heart disease of native coronary artery without angina pectoris: Secondary | ICD-10-CM | POA: Diagnosis not present

## 2018-08-18 DIAGNOSIS — R7309 Other abnormal glucose: Secondary | ICD-10-CM | POA: Diagnosis not present

## 2018-08-18 DIAGNOSIS — M0579 Rheumatoid arthritis with rheumatoid factor of multiple sites without organ or systems involvement: Secondary | ICD-10-CM | POA: Diagnosis not present

## 2018-08-18 DIAGNOSIS — E039 Hypothyroidism, unspecified: Secondary | ICD-10-CM | POA: Diagnosis not present

## 2018-08-27 DIAGNOSIS — Z79899 Other long term (current) drug therapy: Secondary | ICD-10-CM | POA: Diagnosis not present

## 2018-08-27 DIAGNOSIS — M150 Primary generalized (osteo)arthritis: Secondary | ICD-10-CM | POA: Diagnosis not present

## 2018-08-27 DIAGNOSIS — M0579 Rheumatoid arthritis with rheumatoid factor of multiple sites without organ or systems involvement: Secondary | ICD-10-CM | POA: Diagnosis not present

## 2018-08-27 DIAGNOSIS — Z6824 Body mass index (BMI) 24.0-24.9, adult: Secondary | ICD-10-CM | POA: Diagnosis not present

## 2018-08-27 DIAGNOSIS — M2550 Pain in unspecified joint: Secondary | ICD-10-CM | POA: Diagnosis not present

## 2018-08-31 DIAGNOSIS — M5136 Other intervertebral disc degeneration, lumbar region: Secondary | ICD-10-CM | POA: Diagnosis not present

## 2018-09-02 ENCOUNTER — Other Ambulatory Visit: Payer: Self-pay | Admitting: Internal Medicine

## 2018-09-02 DIAGNOSIS — K219 Gastro-esophageal reflux disease without esophagitis: Secondary | ICD-10-CM | POA: Diagnosis not present

## 2018-09-02 DIAGNOSIS — M810 Age-related osteoporosis without current pathological fracture: Secondary | ICD-10-CM

## 2018-09-02 DIAGNOSIS — Z8489 Family history of other specified conditions: Secondary | ICD-10-CM | POA: Diagnosis not present

## 2018-09-13 DIAGNOSIS — L97521 Non-pressure chronic ulcer of other part of left foot limited to breakdown of skin: Secondary | ICD-10-CM | POA: Diagnosis not present

## 2018-09-15 DIAGNOSIS — M810 Age-related osteoporosis without current pathological fracture: Secondary | ICD-10-CM | POA: Diagnosis not present

## 2018-09-20 DIAGNOSIS — L929 Granulomatous disorder of the skin and subcutaneous tissue, unspecified: Secondary | ICD-10-CM | POA: Diagnosis not present

## 2018-09-20 DIAGNOSIS — S61209A Unspecified open wound of unspecified finger without damage to nail, initial encounter: Secondary | ICD-10-CM | POA: Diagnosis not present

## 2018-09-24 DIAGNOSIS — L980 Pyogenic granuloma: Secondary | ICD-10-CM | POA: Diagnosis not present

## 2018-09-28 DIAGNOSIS — L980 Pyogenic granuloma: Secondary | ICD-10-CM | POA: Diagnosis not present

## 2018-09-28 DIAGNOSIS — H43812 Vitreous degeneration, left eye: Secondary | ICD-10-CM | POA: Diagnosis not present

## 2018-10-07 DIAGNOSIS — L980 Pyogenic granuloma: Secondary | ICD-10-CM | POA: Diagnosis not present

## 2018-10-11 DIAGNOSIS — L97521 Non-pressure chronic ulcer of other part of left foot limited to breakdown of skin: Secondary | ICD-10-CM | POA: Diagnosis not present

## 2018-10-21 DIAGNOSIS — L980 Pyogenic granuloma: Secondary | ICD-10-CM | POA: Diagnosis not present

## 2018-10-25 DIAGNOSIS — H0100B Unspecified blepharitis left eye, upper and lower eyelids: Secondary | ICD-10-CM | POA: Diagnosis not present

## 2018-10-25 DIAGNOSIS — H0100A Unspecified blepharitis right eye, upper and lower eyelids: Secondary | ICD-10-CM | POA: Diagnosis not present

## 2018-10-27 ENCOUNTER — Ambulatory Visit
Admission: RE | Admit: 2018-10-27 | Discharge: 2018-10-27 | Disposition: A | Discharge status: Home / Self Care | Payer: Medicare Other | Source: Ambulatory Visit | Attending: Internal Medicine | Admitting: Internal Medicine

## 2018-10-27 DIAGNOSIS — Z780 Asymptomatic menopausal state: Secondary | ICD-10-CM | POA: Diagnosis not present

## 2018-10-27 DIAGNOSIS — M85832 Other specified disorders of bone density and structure, left forearm: Secondary | ICD-10-CM | POA: Diagnosis not present

## 2018-10-27 DIAGNOSIS — M810 Age-related osteoporosis without current pathological fracture: Secondary | ICD-10-CM

## 2018-11-04 DIAGNOSIS — M5412 Radiculopathy, cervical region: Secondary | ICD-10-CM | POA: Diagnosis not present

## 2018-11-04 DIAGNOSIS — M5030 Other cervical disc degeneration, unspecified cervical region: Secondary | ICD-10-CM | POA: Diagnosis not present

## 2018-11-04 DIAGNOSIS — M542 Cervicalgia: Secondary | ICD-10-CM | POA: Diagnosis not present

## 2018-11-09 DIAGNOSIS — L97521 Non-pressure chronic ulcer of other part of left foot limited to breakdown of skin: Secondary | ICD-10-CM | POA: Diagnosis not present

## 2018-11-09 DIAGNOSIS — L84 Corns and callosities: Secondary | ICD-10-CM | POA: Diagnosis not present

## 2018-11-09 DIAGNOSIS — I739 Peripheral vascular disease, unspecified: Secondary | ICD-10-CM | POA: Diagnosis not present

## 2018-11-19 DIAGNOSIS — L570 Actinic keratosis: Secondary | ICD-10-CM | POA: Diagnosis not present

## 2018-11-19 DIAGNOSIS — L817 Pigmented purpuric dermatosis: Secondary | ICD-10-CM | POA: Diagnosis not present

## 2018-11-19 DIAGNOSIS — L980 Pyogenic granuloma: Secondary | ICD-10-CM | POA: Diagnosis not present

## 2018-11-19 DIAGNOSIS — D2262 Melanocytic nevi of left upper limb, including shoulder: Secondary | ICD-10-CM | POA: Diagnosis not present

## 2018-11-19 DIAGNOSIS — L821 Other seborrheic keratosis: Secondary | ICD-10-CM | POA: Diagnosis not present

## 2018-11-19 DIAGNOSIS — L814 Other melanin hyperpigmentation: Secondary | ICD-10-CM | POA: Diagnosis not present

## 2018-11-19 DIAGNOSIS — L718 Other rosacea: Secondary | ICD-10-CM | POA: Diagnosis not present

## 2018-11-19 DIAGNOSIS — Z85828 Personal history of other malignant neoplasm of skin: Secondary | ICD-10-CM | POA: Diagnosis not present

## 2018-11-19 DIAGNOSIS — L84 Corns and callosities: Secondary | ICD-10-CM | POA: Diagnosis not present

## 2018-11-19 DIAGNOSIS — D1801 Hemangioma of skin and subcutaneous tissue: Secondary | ICD-10-CM | POA: Diagnosis not present

## 2018-11-23 DIAGNOSIS — L980 Pyogenic granuloma: Secondary | ICD-10-CM | POA: Diagnosis not present

## 2018-12-06 DIAGNOSIS — Z6825 Body mass index (BMI) 25.0-25.9, adult: Secondary | ICD-10-CM | POA: Diagnosis not present

## 2018-12-06 DIAGNOSIS — M2550 Pain in unspecified joint: Secondary | ICD-10-CM | POA: Diagnosis not present

## 2018-12-06 DIAGNOSIS — Z79899 Other long term (current) drug therapy: Secondary | ICD-10-CM | POA: Diagnosis not present

## 2018-12-06 DIAGNOSIS — M0579 Rheumatoid arthritis with rheumatoid factor of multiple sites without organ or systems involvement: Secondary | ICD-10-CM | POA: Diagnosis not present

## 2018-12-06 DIAGNOSIS — E663 Overweight: Secondary | ICD-10-CM | POA: Diagnosis not present

## 2018-12-07 DIAGNOSIS — L980 Pyogenic granuloma: Secondary | ICD-10-CM | POA: Diagnosis not present

## 2018-12-07 DIAGNOSIS — L97522 Non-pressure chronic ulcer of other part of left foot with fat layer exposed: Secondary | ICD-10-CM | POA: Diagnosis not present

## 2018-12-24 DIAGNOSIS — M2042 Other hammer toe(s) (acquired), left foot: Secondary | ICD-10-CM | POA: Diagnosis not present

## 2018-12-24 DIAGNOSIS — M7742 Metatarsalgia, left foot: Secondary | ICD-10-CM | POA: Diagnosis not present

## 2018-12-24 DIAGNOSIS — M2040 Other hammer toe(s) (acquired), unspecified foot: Secondary | ICD-10-CM | POA: Insufficient documentation

## 2019-02-14 DIAGNOSIS — K219 Gastro-esophageal reflux disease without esophagitis: Secondary | ICD-10-CM | POA: Diagnosis not present

## 2019-02-14 DIAGNOSIS — Z8489 Family history of other specified conditions: Secondary | ICD-10-CM | POA: Diagnosis not present

## 2019-02-14 DIAGNOSIS — M810 Age-related osteoporosis without current pathological fracture: Secondary | ICD-10-CM | POA: Diagnosis not present

## 2019-02-14 DIAGNOSIS — Z923 Personal history of irradiation: Secondary | ICD-10-CM | POA: Diagnosis not present

## 2019-02-17 DIAGNOSIS — F419 Anxiety disorder, unspecified: Secondary | ICD-10-CM | POA: Diagnosis not present

## 2019-02-17 DIAGNOSIS — M810 Age-related osteoporosis without current pathological fracture: Secondary | ICD-10-CM | POA: Diagnosis not present

## 2019-02-17 DIAGNOSIS — R7303 Prediabetes: Secondary | ICD-10-CM | POA: Diagnosis not present

## 2019-02-17 DIAGNOSIS — I10 Essential (primary) hypertension: Secondary | ICD-10-CM | POA: Diagnosis not present

## 2019-02-17 DIAGNOSIS — E7800 Pure hypercholesterolemia, unspecified: Secondary | ICD-10-CM | POA: Diagnosis not present

## 2019-02-17 DIAGNOSIS — M5135 Other intervertebral disc degeneration, thoracolumbar region: Secondary | ICD-10-CM | POA: Diagnosis not present

## 2019-02-17 DIAGNOSIS — Z79899 Other long term (current) drug therapy: Secondary | ICD-10-CM | POA: Diagnosis not present

## 2019-02-17 DIAGNOSIS — D3000 Benign neoplasm of unspecified kidney: Secondary | ICD-10-CM | POA: Diagnosis not present

## 2019-02-17 DIAGNOSIS — E039 Hypothyroidism, unspecified: Secondary | ICD-10-CM | POA: Diagnosis not present

## 2019-02-28 DIAGNOSIS — L03116 Cellulitis of left lower limb: Secondary | ICD-10-CM | POA: Diagnosis not present

## 2019-03-08 DIAGNOSIS — M2550 Pain in unspecified joint: Secondary | ICD-10-CM | POA: Diagnosis not present

## 2019-03-08 DIAGNOSIS — Z79899 Other long term (current) drug therapy: Secondary | ICD-10-CM | POA: Diagnosis not present

## 2019-03-08 DIAGNOSIS — M0579 Rheumatoid arthritis with rheumatoid factor of multiple sites without organ or systems involvement: Secondary | ICD-10-CM | POA: Diagnosis not present

## 2019-03-09 DIAGNOSIS — L0390 Cellulitis, unspecified: Secondary | ICD-10-CM | POA: Diagnosis not present

## 2019-03-21 DIAGNOSIS — M810 Age-related osteoporosis without current pathological fracture: Secondary | ICD-10-CM | POA: Diagnosis not present

## 2019-04-07 DIAGNOSIS — M5136 Other intervertebral disc degeneration, lumbar region: Secondary | ICD-10-CM | POA: Diagnosis not present

## 2019-04-26 ENCOUNTER — Other Ambulatory Visit: Payer: Self-pay

## 2019-04-26 ENCOUNTER — Ambulatory Visit (INDEPENDENT_AMBULATORY_CARE_PROVIDER_SITE_OTHER): Payer: Medicare Other | Admitting: Podiatry

## 2019-04-26 ENCOUNTER — Telehealth: Payer: Self-pay | Admitting: *Deleted

## 2019-04-26 ENCOUNTER — Encounter: Payer: Self-pay | Admitting: Podiatry

## 2019-04-26 ENCOUNTER — Ambulatory Visit (INDEPENDENT_AMBULATORY_CARE_PROVIDER_SITE_OTHER): Payer: Medicare Other

## 2019-04-26 VITALS — BP 184/96 | HR 97 | Temp 98.3°F

## 2019-04-26 DIAGNOSIS — R262 Difficulty in walking, not elsewhere classified: Secondary | ICD-10-CM | POA: Diagnosis not present

## 2019-04-26 DIAGNOSIS — M79672 Pain in left foot: Secondary | ICD-10-CM

## 2019-04-26 DIAGNOSIS — M79671 Pain in right foot: Secondary | ICD-10-CM

## 2019-04-26 DIAGNOSIS — M05771 Rheumatoid arthritis with rheumatoid factor of right ankle and foot without organ or systems involvement: Secondary | ICD-10-CM

## 2019-04-26 DIAGNOSIS — M05772 Rheumatoid arthritis with rheumatoid factor of left ankle and foot without organ or systems involvement: Secondary | ICD-10-CM | POA: Diagnosis not present

## 2019-04-26 DIAGNOSIS — L97521 Non-pressure chronic ulcer of other part of left foot limited to breakdown of skin: Secondary | ICD-10-CM | POA: Diagnosis not present

## 2019-04-26 MED ORDER — CEPHALEXIN 500 MG PO CAPS: 500.0000 mg | ORAL_CAPSULE | Freq: Three times a day (TID) | ORAL | 0 refills | Status: DC

## 2019-04-26 MED ORDER — MUPIROCIN 2 % EX OINT: TOPICAL_OINTMENT | CUTANEOUS | 0 refills | Status: DC

## 2019-04-26 NOTE — Patient Instructions (Signed)

## 2019-04-26 NOTE — Telephone Encounter (Signed)
Pt called states she just left an appt with Dr. Elisha Ponder, "Please tell Dr. Elisha Ponder walking is [place]."

## 2019-04-26 NOTE — Progress Notes (Signed)
Subjective: [name] presents to clinic with cc of callus plantar left foot.  She has has had this treated by local Podiatrist, but Podiatrist moved to [facility] and it was too far for her to travel.   She eventually attempted to trim painful callus herself which resulted in left foot getting infected. She saw a second local Podiatrist who prescribed oral antibiotics and infection resolved.    She is now here to establish care for routine treatment. She has h/o RA and is on methotrexate.  She relates she still has some tenderness to the left foot callus when weightbearing.  She has soaked left foot in epsom salt/warm water on a daily basis.  Kerry Jordan, MD is her PCP.  Medications reviewed.   Allergies  Allergen Reactions  . Dilaudid [Hydromorphone Hcl] Nausea And Vomiting  . Alendronate Other (See Comments)  . Morphine And Related   . Phenergan [Promethazine Hcl]   . Tamiflu [Oseltamivir Phosphate]     rash  . Taxol [Paclitaxel]     rash  . Tetanus Immune Globulin   . Tetanus Toxoids   . Tramadol Other (See Comments)  . Oseltamivir Rash     Objective: Vitals:   04/26/19 0909  BP: (!) 184/96  Pulse: 97  Temp: 98.3 F (36.8 C)    Physical Examination:  Vascular  Examination: Capillary refill time immediate x 10 digits.  Palpable DP/PT pulses b/l.  Digital hair present b/l.  Noted moderate sized varicosities near her ankle areas b/l.   Skin temperature gradient WNL b/l.  Dermatological Examination: Skin with normal turgor, texture and tone b/l.  No open wounds b/l.  No interdigital macerations noted b/l.  Toenails adequate length b/l.  Hyperkeratotic lesion submet head 2 b/l  with tenderness to palpation noted to lesion of left foot   Callus left foot is hyperkeratotic with subdermal hemorrhage and centralized area of maceration. No purulence expressed. No surrounding erythema, no edema, no drainage.  Musculoskeletal Examination: Muscle  strength 5/5 to all muscle groups b/l.  HAV with bunion b/l.  Hammertoes lesser digits b/l.  Neurological Examination: Sensation intact 5/5 b/l with 10 gram monofilament.  Vibratory sensation intact b/l.  Xray left foot: No bone erosion of 2nd metatarsal head left foot Contracted lesser digits left foot Osteopenia noted metatarsals DJD of anterior tibio-talar joint  Assessment: 1. Plantar ulcer left foot limited to breakdown of skin 2. Rheumatoid arthritis 3. HAV with bunion b/l 4. Hammertoes b/l  Plan: 1.  Painful preulcerative callus submet head 2 left foot and callus submet head 2 right foot pared. Area of left foot cleansed with alcohol. TAO and bandaid applied. I have written a Rx for Mupirocin Ointment to be applied daily with a 2 inch fabric band-aid. Also Rx for Keflex 500 mg po tid x 10 days. Xray left foot taken/reviewed with [name]. I have asked her to discontinue soaking her feet for now to allow her tissue to strengthen. 2.  Offloaded her left New Balance sneaker insole with aperture pad to give her some relief. We will see if [name] can modify Powerstep Inserts for her for this lesion long-term. 3 Continue soft, supportive shoe gear daily. 4.  Report any pedal injuries to medical professional immediately. 5.  Follow up 1 week for re-evaluation of left foot preulcerative lesion.  This area was previously the site of an infection and I want to cover her for an additional 10 days due to weakened tissue from constant foot soaking. 6. Follow  up 3 months for routine foot care of calluses. 7.  Patient/POA to call should there be a question/concern in there interim.

## 2019-04-28 DIAGNOSIS — E039 Hypothyroidism, unspecified: Secondary | ICD-10-CM | POA: Diagnosis not present

## 2019-04-28 DIAGNOSIS — R7303 Prediabetes: Secondary | ICD-10-CM | POA: Diagnosis not present

## 2019-04-28 DIAGNOSIS — E7800 Pure hypercholesterolemia, unspecified: Secondary | ICD-10-CM | POA: Diagnosis not present

## 2019-04-28 DIAGNOSIS — M810 Age-related osteoporosis without current pathological fracture: Secondary | ICD-10-CM | POA: Diagnosis not present

## 2019-04-28 DIAGNOSIS — Z79899 Other long term (current) drug therapy: Secondary | ICD-10-CM | POA: Diagnosis not present

## 2019-05-03 ENCOUNTER — Other Ambulatory Visit: Payer: Self-pay

## 2019-05-03 ENCOUNTER — Encounter: Payer: Self-pay | Admitting: Podiatry

## 2019-05-03 ENCOUNTER — Ambulatory Visit (INDEPENDENT_AMBULATORY_CARE_PROVIDER_SITE_OTHER): Payer: Medicare Other | Admitting: Podiatry

## 2019-05-03 DIAGNOSIS — L237 Allergic contact dermatitis due to plants, except food: Secondary | ICD-10-CM | POA: Diagnosis not present

## 2019-05-03 DIAGNOSIS — L97521 Non-pressure chronic ulcer of other part of left foot limited to breakdown of skin: Secondary | ICD-10-CM | POA: Diagnosis not present

## 2019-05-03 DIAGNOSIS — Z85828 Personal history of other malignant neoplasm of skin: Secondary | ICD-10-CM | POA: Diagnosis not present

## 2019-05-03 DIAGNOSIS — L570 Actinic keratosis: Secondary | ICD-10-CM | POA: Diagnosis not present

## 2019-05-03 NOTE — Patient Instructions (Signed)

## 2019-05-05 NOTE — Progress Notes (Signed)
Subjective:   [name] presents for continued care of ulceration of left foot.  Patient has been applying Mupirocin Ointment to foot daily and keeping it clean and dry. She still has a few Cephalexin capsules left and has been taking as instructed. She states she still has some soreness to bottom of lesion. Patient denies any fever, chills, nightsweats, nausea or vomiting.  She states she can tell the difference in offloading of her New Balance sneaker.   She is asking if she can soak her foot. She states she has always soaked her foot because it makes her feet feel better with the RA.   Current Outpatient Medications:  .  acetaminophen-codeine (TYLENOL #3) 300-30 MG per tablet, Take 1 tablet by mouth every 4 (four) hours as needed for moderate pain. , Disp: , Rfl:  .  ALPRAZolam (XANAX) 0.25 MG tablet, Take 0.125 mg by mouth daily as needed for anxiety. , Disp: , Rfl:  .  amLODipine (NORVASC) 2.5 MG tablet, , Disp: , Rfl:  .  Calcium Citrate-Vitamin D (CITRACAL + D PO), Take 1 tablet by mouth daily., Disp: , Rfl:  .  cephALEXin (KEFLEX) 500 MG capsule, Take 1 capsule (500 mg total) by mouth 3 (three) times daily., Disp: 30 capsule, Rfl: 0 .  cholecalciferol (VITAMIN D) 1000 UNITS tablet, Take 1,000 Units by mouth daily., Disp: , Rfl:  .  cyclobenzaprine (FLEXERIL) 10 MG tablet, TK 1/2 TO 1 T PO Q 8 H PRF MUSCLE SPASM, Disp: , Rfl:  .  denosumab (PROLIA) 60 MG/ML SOLN injection, Inject 60 mg into the skin every 6 (six) months. Administer in upper arm, thigh, or abdomen, Disp: , Rfl:  .  dicyclomine (BENTYL) 10 MG capsule, Take 10 mg by mouth daily as needed for spasms. , Disp: , Rfl:  .  fish oil-omega-3 fatty acids 1000 MG capsule, Take 1,200 mg by mouth daily. , Disp: , Rfl:  .  folic acid (FOLVITE) 1 MG tablet, Take 2 mg by mouth daily. , Disp: , Rfl:  .  levothyroxine (SYNTHROID, LEVOTHROID) 150 MCG tablet, Take 150 mcg by mouth daily before breakfast., Disp: , Rfl:  .   loratadine (CLARITIN) 10 MG tablet, Take by mouth., Disp: , Rfl:  .  Melatonin 2.5 MG CAPS, Take 2.5 mg by mouth at bedtime as needed. , Disp: , Rfl:  .  methotrexate (50 MG/ML) 1 G injection, Inject 0.8 mg into the vein once a week. Sunday, Disp: , Rfl:  .  Multiple Vitamin (MULTIVITAMIN) capsule, Take 1 capsule by mouth daily., Disp: , Rfl:  .  mupirocin ointment (BACTROBAN) 2 %, Apply to left foot once daily., Disp: 22 g, Rfl: 0 .  naproxen (NAPRELAN) 500 MG 24 hr tablet, Take 500 mg by mouth daily with breakfast. , Disp: , Rfl:  .  naproxen (NAPROSYN) 500 MG tablet, naproxen 500 mg tablet, Disp: , Rfl:  .  Polyethylene Glycol 3350 (MIRALAX PO), Take by mouth See admin instructions. 3/4 a capful once daily, Disp: , Rfl:  .  potassium chloride (K-DUR) 10 MEQ tablet, Take by mouth., Disp: , Rfl:  .  potassium chloride SA (K-DUR,KLOR-CON) 20 MEQ tablet, Take 20 mEq by mouth daily., Disp: , Rfl:  .  predniSONE (DELTASONE) 5 MG tablet, prednisone 5 mg tablets in a dose pack  Take 1 dose pk by oral route as directed for 6 days., Disp: , Rfl:  .  ranitidine (ZANTAC) 75 MG tablet, Take 75 mg by  mouth as needed for heartburn., Disp: , Rfl:  .  simvastatin (ZOCOR) 20 MG tablet, Take 20 mg by mouth every evening., Disp: , Rfl:  .  simvastatin (ZOCOR) 40 MG tablet, , Disp: , Rfl:  .  triamterene-hydrochlorothiazide (MAXZIDE-25) 37.5-25 MG per tablet, Take 1 tablet by mouth daily., Disp: , Rfl:    Allergies  Allergen Reactions  . Dilaudid [Hydromorphone Hcl] Nausea And Vomiting  . Alendronate Other (See Comments)  . Morphine And Related   . Phenergan [Promethazine Hcl]   . Tamiflu [Oseltamivir Phosphate]     rash  . Taxol [Paclitaxel]     rash  . Tetanus Immune Globulin   . Tetanus Toxoids   . Tramadol Other (See Comments)  . Oseltamivir Rash     Objective:   Vascular Examination: Capillary refill time immediate x 10 digits.  Dorsalis pedis pulses palpable b/l.  Posterior tibial pulses  palpable b/l.  Digital hair present x 10 digits.  Skin temperature gradient WNL  B/l.  Dermatological Examination: Skin with normal turgor, texture and tone b/l.  Toenails 1-5 b/l show evidence of recent debridement.  Ulceration located submet head 2 left foot: Predebridement measurements carried out today of 2.0 x 2.0 cm with hyperkeratotic roof.  No periulcerative erythema, no edema, no drainage.  Flocculence and pain is noted at 6 o'clock position.  Malodor negative.  Postdebridement measurements submet head 2 left foot today are: 0.2 x 0.2 x 0.1 cm. Red, granular base noted. No odor.  No tracking, no tunneling, no undermining. No probing to bone, no purulent drainage.  No deep abscess evident.  Musculoskeletal: Muscle strength 5/5 to all LE muscle groups b/l.  HAV with bunion b/l.  Hammertoes lesser digits b/l.  Neurological: Sensation intact 5/5 b/l with 10 gram monofilament.  Vibratory sensation intact b/l.  Assessment:   1.  Full thickness ulceration submet head 2 left foot (0.2  0.2 x 0.1 cm) 2.    Rheumatoid arthritis  Plan: 1. Ulcer was debrided and reactive hyperkeratoses and necrotic tissue was resected to the level of bleeding or viable tissue. Ulcer was cleansed with wound cleanser. Iodosorb Gel was applied to base of wound with light dressing. 2. Keep left foot dry. Do not soak foot. Continue Mupirocin Ointment and bandaid daily. Continue off-loaded New IT consultant. Patient was given instructions on offloading and dressing change/aftercare and was instructed to call immediately if any signs or symptoms of infection arise.  3. Complete Cephalexin capsules until all are gone. Continue Mupirocin Ointment dressing changes daily. Patient is to follow up in one week with Dr. Jacqualyn Posey.  4. Patient instructed to report to emergency department with worsening appearance of ulcer/toe/foot, increased pain, foul odor, increased redness, swelling, drainage, fever, chills,  nightsweats, nausea, vomiting, increased blood sugar.  5. Patient/POA related understanding.

## 2019-05-10 ENCOUNTER — Encounter: Payer: Self-pay | Admitting: Podiatry

## 2019-05-10 ENCOUNTER — Other Ambulatory Visit: Payer: Self-pay

## 2019-05-10 ENCOUNTER — Ambulatory Visit (INDEPENDENT_AMBULATORY_CARE_PROVIDER_SITE_OTHER): Payer: Medicare Other | Admitting: Podiatry

## 2019-05-10 VITALS — Temp 97.6°F

## 2019-05-10 DIAGNOSIS — M05771 Rheumatoid arthritis with rheumatoid factor of right ankle and foot without organ or systems involvement: Secondary | ICD-10-CM

## 2019-05-10 DIAGNOSIS — M05772 Rheumatoid arthritis with rheumatoid factor of left ankle and foot without organ or systems involvement: Secondary | ICD-10-CM

## 2019-05-10 DIAGNOSIS — L97521 Non-pressure chronic ulcer of other part of left foot limited to breakdown of skin: Secondary | ICD-10-CM

## 2019-05-16 NOTE — Progress Notes (Signed)
Subjective: 85 year old female presents the office today for follow-up valuation of a wound on the left foot.  She was last seen by Dr. Elisha Ponder and was found to have an ulceration submetatarsal 2 on the left foot.  Patient states that she is been keeping antibiotic ointment on the area daily.  She feels that she is doing much better and the wound is healed.  She has not seen any drainage or pus or any swelling. Denies any systemic complaints such as fevers, chills, nausea, vomiting. No acute changes since last appointment, and no other complaints at this time.   Objective: AAO x3, NAD DP/PT pulses palpable bilaterally, CRT less than 3 seconds Problems with metatarsal heads plantarly with atrophy of fat pad.  Hyperkeratotic lesion right foot submetatarsal 2.  The left foot there is mild hyperkeratotic tissue with some dried blood.  Upon debridement.  The wound is healed.  There is no drainage or pus.  No surrounding erythema, ascending cellulitis.  There is no fluctuation or crepitation malodor. No open lesions or pre-ulcerative lesions.  No pain with calf compression, swelling, warmth, erythema  Assessment: Wound left foot which is healed  Plan: -All treatment options discussed with the patient including all alternatives, risks, complications.  -Treated hyperkeratotic tissue without any complications or bleeding.  The wound appears to be healed.  Discussed offloading.  Small amount of moisturizer daily.  Monitor for any reoccurrence. -Patient encouraged to call the office with any questions, concerns, change in symptoms.   Trula Slade DPM

## 2019-05-20 DIAGNOSIS — H903 Sensorineural hearing loss, bilateral: Secondary | ICD-10-CM | POA: Diagnosis not present

## 2019-05-20 DIAGNOSIS — H6123 Impacted cerumen, bilateral: Secondary | ICD-10-CM | POA: Diagnosis not present

## 2019-05-20 DIAGNOSIS — Z974 Presence of external hearing-aid: Secondary | ICD-10-CM | POA: Diagnosis not present

## 2019-05-26 DIAGNOSIS — Z961 Presence of intraocular lens: Secondary | ICD-10-CM | POA: Diagnosis not present

## 2019-05-26 DIAGNOSIS — H524 Presbyopia: Secondary | ICD-10-CM | POA: Diagnosis not present

## 2019-05-26 DIAGNOSIS — H52203 Unspecified astigmatism, bilateral: Secondary | ICD-10-CM | POA: Diagnosis not present

## 2019-06-03 ENCOUNTER — Encounter: Payer: Self-pay | Admitting: Podiatry

## 2019-06-03 ENCOUNTER — Ambulatory Visit (INDEPENDENT_AMBULATORY_CARE_PROVIDER_SITE_OTHER): Payer: Medicare Other | Admitting: Podiatry

## 2019-06-03 ENCOUNTER — Other Ambulatory Visit: Payer: Self-pay

## 2019-06-03 VITALS — Temp 97.4°F

## 2019-06-03 DIAGNOSIS — M05771 Rheumatoid arthritis with rheumatoid factor of right ankle and foot without organ or systems involvement: Secondary | ICD-10-CM

## 2019-06-03 DIAGNOSIS — L97521 Non-pressure chronic ulcer of other part of left foot limited to breakdown of skin: Secondary | ICD-10-CM | POA: Diagnosis not present

## 2019-06-03 DIAGNOSIS — M05772 Rheumatoid arthritis with rheumatoid factor of left ankle and foot without organ or systems involvement: Secondary | ICD-10-CM

## 2019-06-08 DIAGNOSIS — Z79899 Other long term (current) drug therapy: Secondary | ICD-10-CM | POA: Diagnosis not present

## 2019-06-08 DIAGNOSIS — M0579 Rheumatoid arthritis with rheumatoid factor of multiple sites without organ or systems involvement: Secondary | ICD-10-CM | POA: Diagnosis not present

## 2019-06-08 DIAGNOSIS — Z6824 Body mass index (BMI) 24.0-24.9, adult: Secondary | ICD-10-CM | POA: Diagnosis not present

## 2019-06-08 DIAGNOSIS — M2550 Pain in unspecified joint: Secondary | ICD-10-CM | POA: Diagnosis not present

## 2019-06-12 NOTE — Progress Notes (Signed)
Subjective:   [name] presents for follow up healed ulceration of left foot.  Patient states her foot feels much better. She did see Dr. Jacqualyn Franco who found her ulceration to be healed.  Pt. denies any new complaints.  Patient denies any fever, chills, nightsweats, nausea or vomiting.   Current Outpatient Medications:  .  betamethasone dipropionate (DIPROLENE) 0.05 % cream, , Disp: , Rfl:  .  acetaminophen-codeine (TYLENOL #3) 300-30 MG per tablet, Take 1 tablet by mouth every 4 (four) hours as needed for moderate pain. , Disp: , Rfl:  .  ALPRAZolam (XANAX) 0.25 MG tablet, Take 0.125 mg by mouth daily as needed for anxiety. , Disp: , Rfl:  .  amLODipine (NORVASC) 2.5 MG tablet, , Disp: , Rfl:  .  Calcium Citrate-Vitamin D (CITRACAL + D PO), Take 1 tablet by mouth daily., Disp: , Rfl:  .  cephALEXin (KEFLEX) 500 MG capsule, Take 1 capsule (500 mg total) by mouth 3 (three) times daily., Disp: 30 capsule, Rfl: 0 .  cholecalciferol (VITAMIN D) 1000 UNITS tablet, Take 1,000 Units by mouth daily., Disp: , Rfl:  .  cyclobenzaprine (FLEXERIL) 10 MG tablet, TK 1/2 TO 1 T PO Q 8 H PRF MUSCLE SPASM, Disp: , Rfl:  .  denosumab (PROLIA) 60 MG/ML SOLN injection, Inject 60 mg into the skin every 6 (six) months. Administer in upper arm, thigh, or abdomen, Disp: , Rfl:  .  dicyclomine (BENTYL) 10 MG capsule, Take 10 mg by mouth daily as needed for spasms. , Disp: , Rfl:  .  fish oil-omega-3 fatty acids 1000 MG capsule, Take 1,200 mg by mouth daily. , Disp: , Rfl:  .  folic acid (FOLVITE) 1 MG tablet, Take 2 mg by mouth daily. , Disp: , Rfl:  .  levothyroxine (SYNTHROID, LEVOTHROID) 150 MCG tablet, Take 150 mcg by mouth daily before breakfast., Disp: , Rfl:  .  loratadine (CLARITIN) 10 MG tablet, Take by mouth., Disp: , Rfl:  .  Melatonin 2.5 MG CAPS, Take 2.5 mg by mouth at bedtime as needed. , Disp: , Rfl:  .  methotrexate (50 MG/ML) 1 G injection, Inject 0.8 mg into the vein once a week. Sunday,  Disp: , Rfl:  .  Multiple Vitamin (MULTIVITAMIN) capsule, Take 1 capsule by mouth daily., Disp: , Rfl:  .  mupirocin ointment (BACTROBAN) 2 %, Apply to left foot once daily., Disp: 22 g, Rfl: 0 .  naproxen (NAPRELAN) 500 MG 24 hr tablet, Take 500 mg by mouth daily with breakfast. , Disp: , Rfl:  .  naproxen (NAPROSYN) 500 MG tablet, naproxen 500 mg tablet, Disp: , Rfl:  .  Polyethylene Glycol 3350 (MIRALAX PO), Take by mouth See admin instructions. 3/4 a capful once daily, Disp: , Rfl:  .  potassium chloride (K-DUR) 10 MEQ tablet, Take by mouth., Disp: , Rfl:  .  potassium chloride SA (K-DUR,KLOR-CON) 20 MEQ tablet, Take 20 mEq by mouth daily., Disp: , Rfl:  .  predniSONE (DELTASONE) 5 MG tablet, prednisone 5 mg tablets in a dose pack  Take 1 dose pk by oral route as directed for 6 days., Disp: , Rfl:  .  ranitidine (ZANTAC) 75 MG tablet, Take 75 mg by mouth as needed for heartburn., Disp: , Rfl:  .  simvastatin (ZOCOR) 20 MG tablet, Take 20 mg by mouth every evening., Disp: , Rfl:  .  simvastatin (ZOCOR) 40 MG tablet, , Disp: , Rfl:  .  triamterene-hydrochlorothiazide (MAXZIDE-25) 37.5-25 MG  per tablet, Take 1 tablet by mouth daily., Disp: , Rfl:    Allergies  Allergen Reactions  . Dilaudid [Hydromorphone Hcl] Nausea And Vomiting  . Alendronate Other (See Comments)  . Morphine And Related   . Phenergan [Promethazine Hcl]   . Tamiflu [Oseltamivir Phosphate]     rash  . Taxol [Paclitaxel]     rash  . Tetanus Immune Globulin   . Tetanus Toxoids   . Tramadol Other (See Comments)  . Oseltamivir Rash     Objective:   Vascular Examination: Vitals:   06/03/19 1010  Temp: (!) 97.4 F (36.3 C)    Capillary refill time immediate x 10 digits.  Dorsalis pedis pulses palpable b/l.  Posterior tibial pulses palpable b/l.  Digital hair present x 10 digits.  Skin temperature gradient WNL  B/l.  Dermatological Examination: Skin with normal turgor, texture and tone b/l.  Toenails 1-5  b/l adequate length on today.  Hyperkeratotic lesion submet head 2 left foot with subdermal hemorrhage. No perilesion erythema, edema, drainage, nor flocculence noted.   Musculoskeletal: Muscle strength 5/5 to all LE muscle groups.  HAV with bunion b/l. Hammertoes lesser digits b/l.  Neurological: Sensation intact 5/5 b/l with 10 gram monofilament.  Vibratory sensation intact b/l.  Assessment:   1.  Ulceration submet head 2 left foot 2.  RA  Plan: 1. Preulcerative callus debrided without incident. 2. Continue off-loaded New Balance sneaker. 3. Patient is to follow up 1 month. 4. Patient instructed to report to emergency department with worsening appearance of toe/foot, increased pain, foul odor, increased redness, swelling, drainage, fever, chills, nightsweats, nausea, vomiting, increased blood sugar.  5. Patient/POA related understanding.

## 2019-06-14 DIAGNOSIS — H903 Sensorineural hearing loss, bilateral: Secondary | ICD-10-CM | POA: Diagnosis not present

## 2019-06-27 ENCOUNTER — Other Ambulatory Visit: Payer: Self-pay | Admitting: Family Medicine

## 2019-06-27 DIAGNOSIS — Z1231 Encounter for screening mammogram for malignant neoplasm of breast: Secondary | ICD-10-CM

## 2019-07-19 DIAGNOSIS — Z23 Encounter for immunization: Secondary | ICD-10-CM | POA: Diagnosis not present

## 2019-07-25 ENCOUNTER — Ambulatory Visit (INDEPENDENT_AMBULATORY_CARE_PROVIDER_SITE_OTHER): Payer: Medicare Other

## 2019-07-25 ENCOUNTER — Other Ambulatory Visit: Payer: Self-pay

## 2019-07-25 ENCOUNTER — Encounter: Payer: Self-pay | Admitting: Orthopaedic Surgery

## 2019-07-25 ENCOUNTER — Ambulatory Visit (INDEPENDENT_AMBULATORY_CARE_PROVIDER_SITE_OTHER): Payer: Medicare Other | Admitting: Orthopaedic Surgery

## 2019-07-25 DIAGNOSIS — M5442 Lumbago with sciatica, left side: Secondary | ICD-10-CM

## 2019-07-25 DIAGNOSIS — M5441 Lumbago with sciatica, right side: Secondary | ICD-10-CM

## 2019-07-25 DIAGNOSIS — R1032 Left lower quadrant pain: Secondary | ICD-10-CM

## 2019-07-25 DIAGNOSIS — G8929 Other chronic pain: Secondary | ICD-10-CM | POA: Diagnosis not present

## 2019-07-25 DIAGNOSIS — R1031 Right lower quadrant pain: Secondary | ICD-10-CM | POA: Diagnosis not present

## 2019-07-25 DIAGNOSIS — M419 Scoliosis, unspecified: Secondary | ICD-10-CM

## 2019-07-25 NOTE — Progress Notes (Signed)
Office Visit Note   Patient: [name]           Date of Birth: [birth year]           MRN: [id number] Visit Date: 07/25/2019              Requested by: Jonathon Jordan, MD [address] [place],  [place] [address] PCP: Jonathon Jordan, MD   Assessment & Plan: Visit Diagnoses:  1. Bilateral groin pain   2. Chronic bilateral low back pain with bilateral sciatica     Plan: She actually has never had an MRI of her lumbar spine.  I believe the physiatry [place] is just injecting multiple areas as it correlates with her pain.  An MRI would be helpful at this point determine if there is a specific area that we should most benefit from injections.  She agrees with this treatment plan as well.  We will also try some outpatient physical therapy with [facility] to work on burst modalities to help decrease her back pain and improve her mobility overall.  We will see her back in 4 weeks and hopefully go over an MRI of her lumbar spine and see how she is down from therapy.  She will continue Voltaren gel on her back because that is been helpful.  All question concerns were answered and addressed.  Follow-Up Instructions: Return in about 4 weeks (around 08/22/2019).   Orders:  Orders Placed This Encounter  Procedures   XR Lumbar Spine 2-3 Views   XR HIPS BILAT W OR W/O PELVIS 2V   No orders of the defined types were placed in this encounter.     Procedures: No procedures performed   Clinical Data: No additional findings.   Subjective: Chief Complaint  Patient presents with   Spine - Pain  The patient something for the first time.  She is actually seen an orthopedic surgeon for her back before and has had epidural steroid injections in her back is recent as June of this year.  This was all through [facility].  She came to me to mainly assess her hips but she does describe low back pain.  She has had some groin pain.  Most of  her pain was in the lower back with no known injury.  She states she knows she has a scoliosis of her lumbar spine.  She has had physical therapy remotely but is been several years.  Both legs are weak and she gets thigh pain mainly in the back of her thighs radiating to her knees.  Most of her pain is low back.  She denies any change in bowel bladder function.  She states that the injections in her lumbar spine have helped somewhat.  She is now diabetic.  HPI  Review of Systems She currently denies any headache, chest pain, shortness of breath, fever, chills, nausea, vomiting  Objective: Vital Signs: There were no vitals taken for this visit.  Physical Exam She is alert and orient x3 and in no acute distress Ortho Exam Examination of both hips show that they move smoothly and fluidly with no pain in the groin at all.  There is no blocks rotation.  She has pain across the lower aspect of her lumbar spine.  She has pain with flexion extension lumbar spine.  Her hamstrings are tight.  She has good muscle tone in her lower extremities.  Her sensation seems to be normal today. Specialty  Comments:  No specialty comments available.  Imaging: Xr Hips Bilat W Or W/o Pelvis 2v  Result Date: 07/25/2019 An AP pelvis and lateral both hips shows no significant arthritic changes in either hip.  The joint spaces are well-maintained and the hip bones are nice concentric and round  Xr Lumbar Spine 2-3 Views  Result Date: 07/25/2019 An AP and lateral lumbar spine shows a severe degenerative scoliosis.  There is osteophytes at multiple levels and disc space narrowing at multiple levels.    PMFS History: Patient Active Problem List   Diagnosis Date Noted   Hammer toe 12/24/2018   Metatarsalgia of left foot 12/24/2018   Degeneration of lumbar intervertebral disc 08/10/2018   Bilateral impacted cerumen 08/07/2017   Sensorineural hearing loss (SNHL) of both ears 08/07/2017   Dyspnea on exertion  12/30/2013   Chest pain on exertion 12/30/2013   HTN (hypertension) 12/30/2013   Hyperlipidemia 12/30/2013   Rheumatoid arthritis ([place])    Hypothyroidism    Breast cancer [facility])    Past Medical History:  Diagnosis Date   Abnormal cardiovascular stress test 12/2013   s/p cath with mild nonobstructive CAD normal EF - managed medically   Breast cancer ([place]) 2008   RT, chemo, lumpectomy   Diverticulitis    DVT (deep vein thrombosis) in pregnancy    site of PICC catheter   HTN (hypertension)    Hyperlipidemia    Hypothyroidism    Kidney tumor 2010   RFA   Personal history of chemotherapy    Personal history of radiation therapy    Prediabetes    Rheumatoid arthritis(714.0)    UTI (lower urinary tract infection)     Family History  Problem Relation Age of Onset   Heart disease Mother    Cancer Mother        stomach   Breast cancer Mother    Heart disease Father    Breast cancer Daughter     Past Surgical History:  Procedure Laterality Date   ABDOMINAL SURGERY     for ruptured diverticuli   bilateral foot surgery     BREAST LUMPECTOMY Left    CARDIAC CATHETERIZATION     CERVICAL SPINE SURGERY     LEFT HEART CATHETERIZATION WITH CORONARY ANGIOGRAM N/A 01/17/2014   Procedure: LEFT HEART CATHETERIZATION WITH CORONARY ANGIOGRAM;  Surgeon: Peter M Martinique, MD;  Location: [facility] CATH LAB;  Service: Cardiovascular;  Laterality: N/A;   OVARIAN CYST REMOVAL     perforation of colon     renal RFA     take down of colonostomy  2011   TONSILLECTOMY     Social History   Occupational History   Not on file  Tobacco Use   Smoking status: Never Smoker   Smokeless tobacco: Never Used  Substance and Sexual Activity   Alcohol use: No   Drug use: Not on file   Sexual activity: Not on file

## 2019-07-29 ENCOUNTER — Ambulatory Visit: Payer: Medicare Other | Admitting: Podiatry

## 2019-08-02 ENCOUNTER — Ambulatory Visit (INDEPENDENT_AMBULATORY_CARE_PROVIDER_SITE_OTHER): Payer: Medicare Other | Admitting: Podiatry

## 2019-08-02 ENCOUNTER — Other Ambulatory Visit: Payer: Self-pay

## 2019-08-02 ENCOUNTER — Encounter: Payer: Self-pay | Admitting: Podiatry

## 2019-08-02 DIAGNOSIS — M05771 Rheumatoid arthritis with rheumatoid factor of right ankle and foot without organ or systems involvement: Secondary | ICD-10-CM | POA: Diagnosis not present

## 2019-08-02 DIAGNOSIS — M05772 Rheumatoid arthritis with rheumatoid factor of left ankle and foot without organ or systems involvement: Secondary | ICD-10-CM

## 2019-08-02 DIAGNOSIS — M79672 Pain in left foot: Secondary | ICD-10-CM | POA: Diagnosis not present

## 2019-08-02 DIAGNOSIS — L84 Corns and callosities: Secondary | ICD-10-CM

## 2019-08-02 DIAGNOSIS — M79671 Pain in right foot: Secondary | ICD-10-CM | POA: Diagnosis not present

## 2019-08-02 NOTE — Patient Instructions (Signed)

## 2019-08-04 NOTE — Progress Notes (Signed)
Subjective: [name] presents to clinic for follow up of painful calluses secondary to rheumatoid arthritis. This pain limits her daily activities. Pain symptoms resolve with periodic professional debridement.  She admits she has decreased the amount of footsoaks.    Current Outpatient Medications on File Prior to Visit  Medication Sig Dispense Refill  . acetaminophen-codeine (TYLENOL #3) 300-30 MG per tablet Take 1 tablet by mouth every 4 (four) hours as needed for moderate pain.     Marland Kitchen ALPRAZolam (XANAX) 0.25 MG tablet Take 0.125 mg by mouth daily as needed for anxiety.     Marland Kitchen amLODipine (NORVASC) 2.5 MG tablet     . betamethasone dipropionate (DIPROLENE) 0.05 % cream     . Calcium Citrate-Vitamin D (CITRACAL + D PO) Take 1 tablet by mouth daily.    . cephALEXin (KEFLEX) 500 MG capsule Take 1 capsule (500 mg total) by mouth 3 (three) times daily. 30 capsule 0  . cholecalciferol (VITAMIN D) 1000 UNITS tablet Take 1,000 Units by mouth daily.    . cyclobenzaprine (FLEXERIL) 10 MG tablet TK 1/2 TO 1 T PO Q 8 H PRF MUSCLE SPASM    . denosumab (PROLIA) 60 MG/ML SOLN injection Inject 60 mg into the skin every 6 (six) months. Administer in upper arm, thigh, or abdomen    . dicyclomine (BENTYL) 10 MG capsule Take 10 mg by mouth daily as needed for spasms.     . fish oil-omega-3 fatty acids 1000 MG capsule Take 1,200 mg by mouth daily.     . folic acid (FOLVITE) 1 MG tablet Take 2 mg by mouth daily.     Marland Kitchen levothyroxine (SYNTHROID, LEVOTHROID) 150 MCG tablet Take 150 mcg by mouth daily before breakfast.    . loratadine (CLARITIN) 10 MG tablet Take by mouth.    . Melatonin 2.5 MG CAPS Take 2.5 mg by mouth at bedtime as needed.     . methotrexate (50 MG/ML) 1 G injection Inject 0.8 mg into the vein once a week. Sunday    . Multiple Vitamin (MULTIVITAMIN) capsule Take 1 capsule by mouth daily.    . mupirocin ointment (BACTROBAN) 2 % Apply to left foot once daily. 22 g 0  . naproxen (NAPRELAN) 500  MG 24 hr tablet Take 500 mg by mouth daily with breakfast.     . naproxen (NAPROSYN) 500 MG tablet naproxen 500 mg tablet    . Polyethylene Glycol 3350 (MIRALAX PO) Take by mouth See admin instructions. 3/4 a capful once daily    . potassium chloride (K-DUR) 10 MEQ tablet Take by mouth.    . potassium chloride SA (K-DUR,KLOR-CON) 20 MEQ tablet Take 20 mEq by mouth daily.    . predniSONE (DELTASONE) 5 MG tablet prednisone 5 mg tablets in a dose pack  Take 1 dose pk by oral route as directed for 6 days.    . ranitidine (ZANTAC) 75 MG tablet Take 75 mg by mouth as needed for heartburn.    . simvastatin (ZOCOR) 20 MG tablet Take 20 mg by mouth every evening.    . simvastatin (ZOCOR) 40 MG tablet     . triamterene-hydrochlorothiazide (MAXZIDE-25) 37.5-25 MG per tablet Take 1 tablet by mouth daily.     No current facility-administered medications on file prior to visit.      Allergies  Allergen Reactions  . Dilaudid [Hydromorphone Hcl] Nausea And Vomiting  . Alendronate Other (See Comments)  . Morphine And Related   . Phenergan [Promethazine Hcl]   .  Tamiflu [Oseltamivir Phosphate]     rash  . Taxol [Paclitaxel]     rash  . Tetanus Immune Globulin   . Tetanus Toxoids   . Tramadol Other (See Comments)  . Oseltamivir Rash     Objective: Physical Examination:  Vascular  Examination: Capillary refill time immediate x 10 digits.  Palpable DP/PT pulses b/l.  Digital hair present b/l.  No edema noted b/l.  Skin temperature gradient WNL b/l.  Dermatological Examination: Skin with normal turgor, texture and tone b/l.  No open wounds b/l.  No interdigital macerations noted b/l.  Elongated, thick, discolored brittle toenails with subungual debris and pain on dorsal palpation of nailbeds 1-5 b/l.  Hyperkeratotic lesion submet head 2 b/l with tenderness to palpation. No edema, no erythema, no drainage, no flocculence.   Musculoskeletal Examination: Muscle strength 5/5 to all  muscle groups b/l.  HAV with bunion b/l.  Hammertoes 2-5 b/l.   No pain, crepitus or joint discomfort with active/passive ROM.  Neurological Examination: Sensation intact 5/5 b/l with 10 gram monofilament.  Vibratory sensation intact b/l.  Proprioceptive sensation intact b/l.  Assessment: 1. Callus submet head 2 b/l 2. Rheumatoid arthritis 3. Pain in feet  Plan: 1. Calluses pared submetatarsal head(s) 2 b/l utilizing sterile scalpel blade without incident. 2. Continue soft, supportive shoe gear daily. 3. Report any pedal injuries to medical professional. 4. Follow up 9 weeks. 5. Patient/POA to call should there be a question/concern in there interim.

## 2019-08-10 ENCOUNTER — Other Ambulatory Visit: Payer: Self-pay

## 2019-08-10 ENCOUNTER — Ambulatory Visit: Payer: Medicare Other | Attending: Orthopaedic Surgery | Admitting: Physical Therapy

## 2019-08-10 ENCOUNTER — Encounter: Payer: Self-pay | Admitting: Physical Therapy

## 2019-08-10 DIAGNOSIS — M5442 Lumbago with sciatica, left side: Secondary | ICD-10-CM | POA: Diagnosis not present

## 2019-08-10 DIAGNOSIS — M6281 Muscle weakness (generalized): Secondary | ICD-10-CM | POA: Diagnosis not present

## 2019-08-10 DIAGNOSIS — M4125 Other idiopathic scoliosis, thoracolumbar region: Secondary | ICD-10-CM | POA: Diagnosis not present

## 2019-08-10 DIAGNOSIS — G8929 Other chronic pain: Secondary | ICD-10-CM

## 2019-08-10 NOTE — Patient Instructions (Signed)
Access Code: [email or web address]  URL: [email or web address]  Date: 08/10/2019  Prepared by: Earlie Counts   Patient Education Managing Back Pain Low Back Pain Handout Low Back Pain How to Prevent [place] at [facility] [address] [place], [place] [address] Phone # [phone] Fax [phone]

## 2019-08-10 NOTE — Therapy (Signed)
[facility] [address], [place], [place], [address] Phone: [phone]   Fax:  [phone]  Physical Therapy Evaluation  Patient Details  Name: [name] MRN: [id number] Date of Birth: 1980-03-12 Referring Provider (PT): Dr. Jean Rosenthal   Encounter Date: 08/10/2019  PT End of Session - 08/10/19 1311    Visit Number  1    Date for PT Re-Evaluation  10/05/19    Authorization Type  Medicare    PT Start Time  1215    PT Stop Time  1300    PT Time Calculation (min)  45 min    Activity Tolerance  Patient tolerated treatment well    Behavior During Therapy  [facility] for tasks assessed/performed       Past Medical History:  Diagnosis Date  . Abnormal cardiovascular stress test 12/2013   s/p cath with mild nonobstructive CAD normal EF - managed medically  . Breast cancer ([place]) 2008   RT, chemo, lumpectomy  . Diverticulitis   . DVT (deep vein thrombosis) in pregnancy    site of PICC catheter  . HTN (hypertension)   . Hyperlipidemia   . Hypothyroidism   . Kidney tumor 2010   RFA  . Personal history of chemotherapy   . Personal history of radiation therapy   . Prediabetes   . Rheumatoid arthritis(714.0)   . UTI (lower urinary tract infection)     Past Surgical History:  Procedure Laterality Date  . ABDOMINAL SURGERY     for ruptured diverticuli  . bilateral foot surgery    . BREAST LUMPECTOMY Left   . CARDIAC CATHETERIZATION    . CERVICAL SPINE SURGERY    . LEFT HEART CATHETERIZATION WITH CORONARY ANGIOGRAM N/A 01/17/2014   Procedure: LEFT HEART CATHETERIZATION WITH CORONARY ANGIOGRAM;  Surgeon: Peter M Martinique, MD;  Location: [facility] CATH LAB;  Service: Cardiovascular;  Laterality: N/A;  . OVARIAN CYST REMOVAL    . perforation of colon    . renal RFA    . take down of colonostomy  2011  . TONSILLECTOMY      There were no vitals filed for this visit.   Subjective Assessment - 08/10/19 1223    Subjective  Patient has scoliosis. Pateint has pain mostly on the left side with pain on sacrum and thighs. Lately has a pain going down her back into the left heel. Both hips will hurt. Patient has trouble moving in the morning and thighs feel weak and nervouse about making it to the bathroom in time. When sitting to lean over to turn a light on has a back pain in lumbar spine.    Patient Stated Goals  comfortable with sleeping and daily activities and in the morning    Currently in Pain?  Yes    Pain Score  8    low pain 5/10   Pain Location  Back    Pain Orientation  Lower    Pain Descriptors / Indicators  Sharp;Radiating    Pain Type  Chronic pain    Pain Radiating Towards  can radiate to the left heel or hips    Pain Onset  More than a month ago    Pain Frequency  Intermittent    Aggravating Factors   sitting and turning to turn light off, sitting, static standing to talk to neighbor    Pain Relieving Factors  walk faster, keep moving         [facility] PT Assessment - 08/10/19  0001      Assessment   Medical Diagnosis  M41.9 Scoliosis, unspecified scoliosis type, unspecified spinal region    Referring Provider (PT)  Dr. Jean Rosenthal    Onset Date/Surgical Date  --   chronic   Prior Therapy  none      Precautions   Precautions  Other (comment)    Precaution Comments  history of breast cancer      Restrictions   Weight Bearing Restrictions  No      Balance Screen   Has the patient fallen in the past 6 months  No    Has the patient had a decrease in activity level because of a fear of falling?   No    Is the patient reluctant to leave their home because of a fear of falling?   No      Home Film/video editor residence    Home Access  Stairs to enter    Entrance Stairs-Number of Steps  3    Entrance Stairs-Rails  Can reach both;Right;Left      Prior Function   Level of [place]  Retired      Associate Professor   Overall  Cognitive Status  Within Functional Limits for tasks assessed      Observation/Other Assessments   Focus on Therapeutic Outcomes (FOTO)   47% limitation, goal is 41% limitation      Posture/Postural Control   Posture/Postural Control  Postural limitations    Postural Limitations  Rounded Shoulders;Forward head   scoliosis     ROM / Strength   AROM / PROM / Strength  AROM;PROM;Strength      Strength   Right Hip Extension  3+/5    Right Hip ABduction  3+/5    Left Hip Extension  4/5    Left Hip ABduction  4/5    Right Knee Flexion  4/5    Left Knee Flexion  4/5      Special Tests    Special Tests  Lumbar    Lumbar Tests  Slump Test      Slump test   Findings  Positive    Side  Left    Comment  extend knee -30 degrees but not slumping full extension      Standardized Balance Assessment   Standardized Balance Assessment  Five Times Sit to Stand;Timed Up and Go Test    Five times sit to stand comments   26 sec with hands      Berg Balance Test   Sit to Stand  Able to stand  independently using hands    Standing Unsupported  Able to stand safely 2 minutes    Sitting with Back Unsupported but Feet Supported on Floor or Stool  Able to sit safely and securely 2 minutes    Stand to Sit  Sits safely with minimal use of hands    Transfers  Able to transfer safely, minor use of hands    Standing Unsupported with Eyes Closed  Able to stand 10 seconds safely    Standing Unsupported with Feet Together  Able to place feet together independently and stand 1 minute safely    From Standing, Reach Forward with Outstretched Arm  Can reach forward >5 cm safely (2")    From Standing Position, Pick up Object from Floor  Able to pick up shoe safely and easily    From Standing Position, Turn to Look Behind Over each  Shoulder  Looks behind one side only/other side shows less weight shift   harder to the left due to pain   Turn 360 Degrees  Able to turn 360 degrees safely but slowly   5 sec    Standing Unsupported, Alternately Place Feet on Step/Stool  Able to stand independently and safely and complete 8 steps in 20 seconds    Standing Unsupported, One Foot in Front  Able to place foot tandem independently and hold 30 seconds    Standing on One Leg  Able to lift leg independently and hold 5-10 seconds    Total Score  49    Berg comment:  50% chance of falling      Timed Up and Go Test   TUG  Normal TUG    Normal TUG (seconds)  10                Objective measurements completed on examination: See above findings.              PT Education - 08/10/19 1311    Education Details  Access Code: [email or web address]    Person(s) Educated  Patient    Methods  Explanation;Handout    Comprehension  Verbalized understanding       PT Short Term Goals - 08/10/19 1323      PT SHORT TERM GOAL #1   Title  independent with initial HEP    Time  4    Period  Weeks    Status  New    Target Date  09/07/19      PT SHORT TERM GOAL #2   Title  understand what postures to avoid to decrease spinal fractures.    Time  4    Period  Weeks    Status  New    Target Date  09/07/19      PT SHORT TERM GOAL #3   Title  ---      PT SHORT TERM GOAL #4   Title  ----        PT Long Term Goals - 08/10/19 1258      PT LONG TERM GOAL #1   Title  return demostation on correct lifting and perform daily activities with correct posture ie: sweeping, standing, doing dishes    Time  8    Period  Weeks    Status  New    Target Date  10/05/19      PT LONG TERM GOAL #2   Title  lumbar pain decreased  to manageble level to perform daily tasks with greater ease    Time  8    Period  Weeks    Status  New    Target Date  10/05/19      PT LONG TERM GOAL #3   Title  Berg balance score >/= 52/56 due to improved balance    Period  Weeks    Status  New    Target Date  10/05/19      PT LONG TERM GOAL #4   Title  FOTO score </= 41 % limitation compared to 47%    Time  8    Period  Weeks     Status  New    Target Date  10/05/19      PT LONG TERM GOAL #5   Title  sit to stand </= 15 sec due to decreased fear of falling    Time  8    Period  Weeks  Status  New    Target Date  10/05/19             Plan - 08/10/19 1314    Clinical Impression Statement  Patient is a 85 year old female with chronic back pain and fear of falling. Patient back pain ranges from 5-8/10 with sitting, standing, sit and twist making it worse. Patient bilateral knee flexion 4/5 and bilateral hip strength averages 4/5. Berg balance is 49/56 with most difficulty with reaching forward, standing on one leg and turning in a circle. Sit to stand was 26 sec when it should be 15 sec or less for her age. Patient has trouble getting her left leg in the car due to back pain. Patient would benefit from skilled therapy to improve balance and education on pain management to perform home activities with greater ease.    Personal Factors and Comorbidities  Comorbidity 1;Comorbidity 2;Comorbidity 3+;Age;Fitness    Comorbidities  osteoporosis, breast cancer, scoliosis    Examination-Activity Limitations  Locomotion Level;Carry;Stand;Sit;Lift    Examination-Participation Restrictions  Community Activity;Cleaning    Stability/Clinical Decision Making  Evolving/Moderate complexity    Clinical Decision Making  Moderate    Rehab Potential  Excellent    PT Frequency  2x / week    PT Duration  8 weeks    PT Treatment/Interventions  ADLs/Self Care Home Management;Electrical Stimulation;Balance training;Therapeutic exercise;Therapeutic activities;Functional mobility training;Neuromuscular re-education;Patient/family education;Dry needling;Manual techniques    PT Next Visit Plan  review information given from last visit on balance, pain management, soft tissue work if needed, balance exercises for single leg stance and reachiing, body mechanics with home tasks, core strength, LE strength    PT Home Exercise Plan  Access Code:  [email or web address]    Consulted and Agree with Plan of Care  Patient       Patient will benefit from skilled therapeutic intervention in order to improve the following deficits and impairments:  Decreased endurance, Pain, Improper body mechanics, Decreased strength  Visit Diagnosis: Chronic bilateral low back pain with left-sided sciatica - Plan: PT plan of care cert/re-cert  Muscle weakness (generalized) - Plan: PT plan of care cert/re-cert  Other idiopathic scoliosis, thoracolumbar region - Plan: PT plan of care cert/re-cert     Problem List Patient Active Problem List   Diagnosis Date Noted  . Hammer toe 12/24/2018  . Metatarsalgia of left foot 12/24/2018  . Degeneration of lumbar intervertebral disc 08/10/2018  . Bilateral impacted cerumen 08/07/2017  . Sensorineural hearing loss (SNHL) of both ears 08/07/2017  . Dyspnea on exertion 12/30/2013  . Chest pain on exertion 12/30/2013  . HTN (hypertension) 12/30/2013  . Hyperlipidemia 12/30/2013  . Rheumatoid arthritis ([place])   . Hypothyroidism   . Breast cancer [facility])     Earlie Counts, PT 08/10/19 1:28 PM   [facility] [address] [place], [place], [address] Phone: [phone]   Fax:  [phone]  Name: SARALYN CARDOZA MRN: [id number] Date of Birth: [birth year]

## 2019-08-11 ENCOUNTER — Ambulatory Visit: Payer: Medicare Other

## 2019-08-13 ENCOUNTER — Ambulatory Visit
Admission: RE | Admit: 2019-08-13 | Discharge: 2019-08-13 | Disposition: A | Discharge status: Home / Self Care | Payer: Medicare Other | Source: Ambulatory Visit | Attending: Orthopaedic Surgery | Admitting: Orthopaedic Surgery

## 2019-08-13 ENCOUNTER — Other Ambulatory Visit: Payer: Self-pay

## 2019-08-13 DIAGNOSIS — M48061 Spinal stenosis, lumbar region without neurogenic claudication: Secondary | ICD-10-CM | POA: Diagnosis not present

## 2019-08-13 DIAGNOSIS — M419 Scoliosis, unspecified: Secondary | ICD-10-CM

## 2019-08-16 ENCOUNTER — Encounter: Payer: Self-pay | Admitting: Physical Therapy

## 2019-08-16 ENCOUNTER — Ambulatory Visit: Payer: Medicare Other | Attending: Orthopaedic Surgery | Admitting: Physical Therapy

## 2019-08-16 ENCOUNTER — Other Ambulatory Visit: Payer: Self-pay

## 2019-08-16 DIAGNOSIS — G8929 Other chronic pain: Secondary | ICD-10-CM

## 2019-08-16 DIAGNOSIS — M5442 Lumbago with sciatica, left side: Secondary | ICD-10-CM | POA: Diagnosis not present

## 2019-08-16 DIAGNOSIS — M6281 Muscle weakness (generalized): Secondary | ICD-10-CM | POA: Diagnosis not present

## 2019-08-16 DIAGNOSIS — M4125 Other idiopathic scoliosis, thoracolumbar region: Secondary | ICD-10-CM | POA: Insufficient documentation

## 2019-08-16 NOTE — Patient Instructions (Signed)
Access Code: [email or web address]  URL: [email or web address]  Date: 08/16/2019  Prepared by: Sherol Dade   Exercises  Hooklying Clamshell with Resistance - 10 reps - 3 sets - 1x daily - 7x weekly  Supine Double Knee to Chest - 5 reps - 5 hold - 1x daily - 7x weekly  Patient Education  Managing Back Pain  Low Back Pain Handout  Low Back Pain  How to Prevent Falls  [facility] [address], [place], [place] [address] Phone # [phone] Fax [phone]

## 2019-08-16 NOTE — Therapy (Signed)
[facility] [address] [place], [place], [address] Phone: [phone]   Fax:  [phone]  Physical Therapy Treatment  Patient Details  Name: [name] MRN: [id number] Date of Birth: December 28, 1933 Referring Provider (PT): Dr. Jean Rosenthal   Encounter Date: 08/16/2019  PT End of Session - 08/16/19 0943    Visit Number  2    Date for PT Re-Evaluation  10/05/19    Authorization Type  Medicare    PT Start Time  0930    PT Stop Time  1010    PT Time Calculation (min)  40 min    Activity Tolerance  Patient tolerated treatment well;No increased pain    Behavior During Therapy  WFL for tasks assessed/performed       Past Medical History:  Diagnosis Date  . Abnormal cardiovascular stress test 12/2013   s/p cath with mild nonobstructive CAD normal EF - managed medically  . Breast cancer ([place]) 2008   RT, chemo, lumpectomy  . Diverticulitis   . DVT (deep vein thrombosis) in pregnancy    site of PICC catheter  . HTN (hypertension)   . Hyperlipidemia   . Hypothyroidism   . Kidney tumor 2010   RFA  . Personal history of chemotherapy   . Personal history of radiation therapy   . Prediabetes   . Rheumatoid arthritis(714.0)   . UTI (lower urinary tract infection)     Past Surgical History:  Procedure Laterality Date  . ABDOMINAL SURGERY     for ruptured diverticuli  . bilateral foot surgery    . BREAST LUMPECTOMY Left   . CARDIAC CATHETERIZATION    . CERVICAL SPINE SURGERY    . LEFT HEART CATHETERIZATION WITH CORONARY ANGIOGRAM N/A 01/17/2014   Procedure: LEFT HEART CATHETERIZATION WITH CORONARY ANGIOGRAM;  Surgeon: Peter M Martinique, MD;  Location: [facility] CATH LAB;  Service: Cardiovascular;  Laterality: N/A;  . OVARIAN CYST REMOVAL    . perforation of colon    . renal RFA    . take down of colonostomy  2011  . TONSILLECTOMY      There were no vitals filed for this visit.  Subjective Assessment - 08/16/19  0934    Subjective  Pt states that she is doing fine this morning. She has no pain currently. No issues with the HEP.    Patient Stated Goals  comfortable with sleeping and daily activities and in the morning    Currently in Pain?  No/denies    Pain Onset  More than a month ago                       [facility] Adult PT Treatment/Exercise - 08/16/19 0001      Exercises   Exercises  Lumbar;Knee/Hip      Lumbar Exercises: Stretches   Double Knee to Chest Stretch Limitations  LE on red physioball x15 reps     Figure 4 Stretch  2 reps;30 seconds    Figure 4 Stretch Limitations  supine       Lumbar Exercises: Aerobic   Nustep  L2 x6 min, PT present to discuss HEP additions       Knee/Hip Exercises: Standing   SLS  2x5 sec, SLS with reach across body x5 reps each LE      Knee/Hip Exercises: Supine   Other Supine Knee/Hip Exercises  clamshell with red TB 2x10 reps each side       Knee/Hip Exercises:  Sidelying   Hip ABduction  Strengthening;Both;1 set;10 reps    Hip ABduction Limitations  PT cuing to decrease compensations in the hip and trunk              PT Education - 08/16/19 0943    Education Details  technique with therex; updated HEP    Person(s) Educated  Patient    Methods  Explanation;Handout    Comprehension  Verbalized understanding       PT Short Term Goals - 08/10/19 1323      PT SHORT TERM GOAL #1   Title  independent with initial HEP    Time  4    Period  Weeks    Status  New    Target Date  09/07/19      PT SHORT TERM GOAL #2   Title  understand what postures to avoid to decrease spinal fractures.    Time  4    Period  Weeks    Status  New    Target Date  09/07/19      PT SHORT TERM GOAL #3   Title  ---      PT SHORT TERM GOAL #4   Title  ----        PT Long Term Goals - 08/10/19 1258      PT LONG TERM GOAL #1   Title  return demostation on correct lifting and perform daily activities with correct posture ie: sweeping,  standing, doing dishes    Time  8    Period  Weeks    Status  New    Target Date  10/05/19      PT LONG TERM GOAL #2   Title  lumbar pain decreased  to manageble level to perform daily tasks with greater ease    Time  8    Period  Weeks    Status  New    Target Date  10/05/19      PT LONG TERM GOAL #3   Title  Berg balance score >/= 52/56 due to improved balance    Period  Weeks    Status  New    Target Date  10/05/19      PT LONG TERM GOAL #4   Title  FOTO score </= 41 % limitation compared to 47%    Time  8    Period  Weeks    Status  New    Target Date  10/05/19      PT LONG TERM GOAL #5   Title  sit to stand </= 15 sec due to decreased fear of falling    Time  8    Period  Weeks    Status  New    Target Date  10/05/19            Plan - 08/16/19 1026    Clinical Impression Statement  Pt arrived without complaints of low back pain. She was able to complete gentle hip/low back mobility exercises on the mat without difficulty. Pt had difficulty with sidelying hip abduction more so on the Lt than the Rt and required therapist tactile cuing to decrease postural compensations with this. Pt demonstrated understanding of HEP updates by the end of today's session and had no complaints of increased low back pain.    Personal Factors and Comorbidities  Comorbidity 1;Comorbidity 2;Comorbidity 3+;Age;Fitness    Comorbidities  osteoporosis, breast cancer, scoliosis    Examination-Activity Limitations  Locomotion Level;Carry;Stand;Sit;Lift    Examination-Participation Restrictions  Community Activity;Cleaning    Stability/Clinical Decision Making  Evolving/Moderate complexity    Rehab Potential  Excellent    PT Frequency  2x / week    PT Duration  8 weeks    PT Treatment/Interventions  ADLs/Self Care Home Management;Electrical Stimulation;Balance training;Therapeutic exercise;Therapeutic activities;Functional mobility training;Neuromuscular re-education;Patient/family  education;Dry needling;Manual techniques    PT Next Visit Plan  progress trunk/LE strength balance exercises for single leg stance and reachiing, body mechanics with home tasks    PT Home Exercise Plan  Access Code: [email or web address]    Consulted and Agree with Plan of Care  Patient       Patient will benefit from skilled therapeutic intervention in order to improve the following deficits and impairments:  Decreased endurance, Pain, Improper body mechanics, Decreased strength  Visit Diagnosis: Chronic bilateral low back pain with left-sided sciatica  Muscle weakness (generalized)  Other idiopathic scoliosis, thoracolumbar region     Problem List Patient Active Problem List   Diagnosis Date Noted  . Hammer toe 12/24/2018  . Metatarsalgia of left foot 12/24/2018  . Degeneration of lumbar intervertebral disc 08/10/2018  . Bilateral impacted cerumen 08/07/2017  . Sensorineural hearing loss (SNHL) of both ears 08/07/2017  . Dyspnea on exertion 12/30/2013  . Chest pain on exertion 12/30/2013  . HTN (hypertension) 12/30/2013  . Hyperlipidemia 12/30/2013  . Rheumatoid arthritis ([place])   . Hypothyroidism   . Breast cancer [facility])     10:42 AM,08/16/19 Sherol Dade PT, DPT [place] [facility] [place]  [facility] [address] [place], [place], [address] Phone: [phone]   Fax:  [phone]  Name: [name] MRN: [id number] Date of Birth: [birth year]

## 2019-08-18 ENCOUNTER — Encounter: Payer: Self-pay | Admitting: Physical Therapy

## 2019-08-18 ENCOUNTER — Other Ambulatory Visit: Payer: Self-pay

## 2019-08-18 ENCOUNTER — Ambulatory Visit: Payer: Medicare Other | Admitting: Physical Therapy

## 2019-08-18 DIAGNOSIS — M4125 Other idiopathic scoliosis, thoracolumbar region: Secondary | ICD-10-CM | POA: Diagnosis not present

## 2019-08-18 DIAGNOSIS — M6281 Muscle weakness (generalized): Secondary | ICD-10-CM | POA: Diagnosis not present

## 2019-08-18 DIAGNOSIS — M5442 Lumbago with sciatica, left side: Secondary | ICD-10-CM | POA: Diagnosis not present

## 2019-08-18 DIAGNOSIS — G8929 Other chronic pain: Secondary | ICD-10-CM

## 2019-08-18 NOTE — Therapy (Signed)
[facility] [address], [place], [place], [address] Phone: [phone]   Fax:  [phone]  Physical Therapy Treatment  Patient Details  Name: [name] MRN: [id number] Date of Birth: 12/24/1933 Referring Provider (PT): Dr. Jean Rosenthal   Encounter Date: 08/18/2019  PT End of Session - 08/18/19 1309    Visit Number  3    Date for PT Re-Evaluation  10/05/19    Authorization Type  Medicare    PT Start Time  1146    PT Stop Time  1226    PT Time Calculation (min)  40 min    Activity Tolerance  Patient tolerated treatment well;No increased pain    Behavior During Therapy  WFL for tasks assessed/performed       Past Medical History:  Diagnosis Date  . Abnormal cardiovascular stress test 12/2013   s/p cath with mild nonobstructive CAD normal EF - managed medically  . Breast cancer ([place]) 2008   RT, chemo, lumpectomy  . Diverticulitis   . DVT (deep vein thrombosis) in pregnancy    site of PICC catheter  . HTN (hypertension)   . Hyperlipidemia   . Hypothyroidism   . Kidney tumor 2010   RFA  . Personal history of chemotherapy   . Personal history of radiation therapy   . Prediabetes   . Rheumatoid arthritis(714.0)   . UTI (lower urinary tract infection)     Past Surgical History:  Procedure Laterality Date  . ABDOMINAL SURGERY     for ruptured diverticuli  . bilateral foot surgery    . BREAST LUMPECTOMY Left   . CARDIAC CATHETERIZATION    . CERVICAL SPINE SURGERY    . LEFT HEART CATHETERIZATION WITH CORONARY ANGIOGRAM N/A 01/17/2014   Procedure: LEFT HEART CATHETERIZATION WITH CORONARY ANGIOGRAM;  Surgeon: Peter M Martinique, MD;  Location: [facility] CATH LAB;  Service: Cardiovascular;  Laterality: N/A;  . OVARIAN CYST REMOVAL    . perforation of colon    . renal RFA    . take down of colonostomy  2011  . TONSILLECTOMY      There were no vitals filed for this visit.  Subjective Assessment - 08/18/19  1147    Subjective  Pt states that she was sore the evening following her last session but the following day things were fine. She has no pain currently.    Patient Stated Goals  comfortable with sleeping and daily activities and in the morning    Currently in Pain?  No/denies    Pain Onset  More than a month ago                       [facility] Adult PT Treatment/Exercise - 08/18/19 0001      Lumbar Exercises: Supine   Ab Set  10 reps;5 seconds    Pelvic Tilt  10 reps    Isometric Hip Flexion  5 reps;5 seconds    Other Supine Lumbar Exercises  sciatic nerve floss x10 reps on Lt       Knee/Hip Exercises: Seated   Ball Squeeze  10x5 sec hold     Clamshell with TheraBand  Red   x15 reps    Hamstring Curl  Strengthening;Both;1 set;10 reps    Hamstring Limitations  red TB          Balance Exercises - 08/18/19 1319      Balance Exercises: Standing   Standing Eyes Opened  Narrow base  of support (BOS);Head turns;Foam/compliant surface   x10 reps    Tandem Stance  Eyes open;Foam/compliant surface;1 rep;30 secs    Step Over Hurdles / Cones  side step over/back with single LE x10 reps each side, supervision assistance     Marching Limitations  standing on foam pad x10 reps         PT Education - 08/18/19 1309    Education Details  technique with therex    Person(s) Educated  Patient    Methods  Explanation;Verbal cues    Comprehension  Verbalized understanding       PT Short Term Goals - 08/10/19 1323      PT SHORT TERM GOAL #1   Title  independent with initial HEP    Time  4    Period  Weeks    Status  New    Target Date  09/07/19      PT SHORT TERM GOAL #2   Title  understand what postures to avoid to decrease spinal fractures.    Time  4    Period  Weeks    Status  New    Target Date  09/07/19      PT SHORT TERM GOAL #3   Title  ---      PT SHORT TERM GOAL #4   Title  ----        PT Long Term Goals - 08/10/19 1258      PT LONG TERM GOAL #1    Title  return demostation on correct lifting and perform daily activities with correct posture ie: sweeping, standing, doing dishes    Time  8    Period  Weeks    Status  New    Target Date  10/05/19      PT LONG TERM GOAL #2   Title  lumbar pain decreased  to manageble level to perform daily tasks with greater ease    Time  8    Period  Weeks    Status  New    Target Date  10/05/19      PT LONG TERM GOAL #3   Title  Berg balance score >/= 52/56 due to improved balance    Period  Weeks    Status  New    Target Date  10/05/19      PT LONG TERM GOAL #4   Title  FOTO score </= 41 % limitation compared to 47%    Time  8    Period  Weeks    Status  New    Target Date  10/05/19      PT LONG TERM GOAL #5   Title  sit to stand </= 15 sec due to decreased fear of falling    Time  8    Period  Weeks    Status  New    Target Date  10/05/19            Plan - 08/18/19 1310    Clinical Impression Statement  Today's session continued with therex to promote core and LE strength. Pt had moderate bilateral hip soreness following her last session, so there were no significant progressions made this visit. Pt did well with balance activity last visit with a stable surface, so this was progressed today adding a foam pad. She had difficulty with head turns, requiring therapist close supervision. Pt also required verbal instruction to focus on exaggerated steps when sidestepping over a small hurdle. Pt denied any soreness end  of today's' session.    Personal Factors and Comorbidities  Comorbidity 1;Comorbidity 2;Comorbidity 3+;Age;Fitness    Comorbidities  osteoporosis, breast cancer, scoliosis    Examination-Activity Limitations  Locomotion Level;Carry;Stand;Sit;Lift    Examination-Participation Restrictions  Community Activity;Cleaning    Stability/Clinical Decision Making  Evolving/Moderate complexity    Rehab Potential  Excellent    PT Frequency  2x / week    PT Duration  8 weeks     PT Treatment/Interventions  ADLs/Self Care Home Management;Electrical Stimulation;Balance training;Therapeutic exercise;Therapeutic activities;Functional mobility training;Neuromuscular re-education;Patient/family education;Dry needling;Manual techniques    PT Next Visit Plan  progress trunk/LE strength balance exercises for single leg stance and reaching, body mechanics with home tasks    PT Home Exercise Plan  Access Code: [email or web address]    Consulted and Agree with Plan of Care  Patient       Patient will benefit from skilled therapeutic intervention in order to improve the following deficits and impairments:  Decreased endurance, Pain, Improper body mechanics, Decreased strength  Visit Diagnosis: Chronic bilateral low back pain with left-sided sciatica  Muscle weakness (generalized)  Other idiopathic scoliosis, thoracolumbar region     Problem List Patient Active Problem List   Diagnosis Date Noted  . Hammer toe 12/24/2018  . Metatarsalgia of left foot 12/24/2018  . Degeneration of lumbar intervertebral disc 08/10/2018  . Bilateral impacted cerumen 08/07/2017  . Sensorineural hearing loss (SNHL) of both ears 08/07/2017  . Dyspnea on exertion 12/30/2013  . Chest pain on exertion 12/30/2013  . HTN (hypertension) 12/30/2013  . Hyperlipidemia 12/30/2013  . Rheumatoid arthritis ([place])   . Hypothyroidism   . Breast cancer ([place])     1:19 PM,08/18/19 Sherol Dade PT, DPT [place] [facility] [place]  [facility] [address] [place], [place], [address] Phone: [phone]   Fax:  [phone]  Name: MRIDULA PIZZI MRN: [id number] Date of Birth: 1934-10-08

## 2019-08-22 ENCOUNTER — Telehealth: Payer: Self-pay | Admitting: Orthopaedic Surgery

## 2019-08-22 ENCOUNTER — Encounter: Payer: Self-pay | Admitting: Orthopaedic Surgery

## 2019-08-22 ENCOUNTER — Ambulatory Visit (INDEPENDENT_AMBULATORY_CARE_PROVIDER_SITE_OTHER): Payer: Medicare Other | Admitting: Orthopaedic Surgery

## 2019-08-22 DIAGNOSIS — M5442 Lumbago with sciatica, left side: Secondary | ICD-10-CM

## 2019-08-22 DIAGNOSIS — G8929 Other chronic pain: Secondary | ICD-10-CM

## 2019-08-22 DIAGNOSIS — M5441 Lumbago with sciatica, right side: Secondary | ICD-10-CM | POA: Diagnosis not present

## 2019-08-22 DIAGNOSIS — M419 Scoliosis, unspecified: Secondary | ICD-10-CM

## 2019-08-22 NOTE — Telephone Encounter (Signed)
Faxed to provided number  

## 2019-08-22 NOTE — Progress Notes (Signed)
The patient is following up to go over a MRI of her lumbar spine.  This was compared to an MRI from 2009.  She is 85 years old very active.  She came to me for second opinion in terms of hip pain and sciatica but x-rays her hips were normal.  Her hip exam is normal.  She is going to physical therapy not helping.  She actually has epidural steroid injections in the past with the most recent being in June by Dr. Herma Mering at [facility].  She says that only lasted a short amount of time.  Right now she feels like functionally she is doing well.  I did review the MRI with her and it shows severe stenosis at multiple levels in the lumbar spine.  This is both foraminal and central.  We went over the actual images of the MRI.  On exam she does have some weakness in her left leg.  She is ambulating without assist device.  Her hip exam bilaterally is normal.  She does have numbness down into her foot on the left side.  We talked about treatment options.  This includes further injections versus a referral for surgical evaluation.  We can even have our physiatrist take a look at her if she would like.  All question concerns were answered and addressed.  She said she will think about things and call us back if she needs this.  Follow-up as otherwise as needed.

## 2019-08-22 NOTE — Telephone Encounter (Signed)
Patient called requesting that Dr. Trevor Mace findings be sent to her PCP Jonathon Jordan.  CB#[phone].  Thank you.

## 2019-08-23 ENCOUNTER — Ambulatory Visit: Payer: Medicare Other | Admitting: Physical Therapy

## 2019-08-23 ENCOUNTER — Other Ambulatory Visit: Payer: Self-pay

## 2019-08-23 ENCOUNTER — Encounter: Payer: Self-pay | Admitting: Physical Therapy

## 2019-08-23 DIAGNOSIS — M6281 Muscle weakness (generalized): Secondary | ICD-10-CM

## 2019-08-23 DIAGNOSIS — G8929 Other chronic pain: Secondary | ICD-10-CM | POA: Diagnosis not present

## 2019-08-23 DIAGNOSIS — M5442 Lumbago with sciatica, left side: Secondary | ICD-10-CM | POA: Diagnosis not present

## 2019-08-23 DIAGNOSIS — M4125 Other idiopathic scoliosis, thoracolumbar region: Secondary | ICD-10-CM

## 2019-08-23 NOTE — Therapy (Signed)
[facility] [address] [place], [place], [address] Phone: [phone]   Fax:  [phone]  Physical Therapy Treatment  Patient Details  Name: [name] MRN: [id number] Date of Birth: 10/05/1934 Referring Provider (PT): Dr. Jean Rosenthal   Encounter Date: 08/23/2019  PT End of Session - 08/23/19 1013    Visit Number  4    Date for PT Re-Evaluation  10/05/19    Authorization Type  Medicare    PT Start Time  0926    PT Stop Time  1012    PT Time Calculation (min)  46 min    Activity Tolerance  Patient tolerated treatment well;No increased pain    Behavior During Therapy  WFL for tasks assessed/performed       Past Medical History:  Diagnosis Date  . Abnormal cardiovascular stress test 12/2013   s/p cath with mild nonobstructive CAD normal EF - managed medically  . Breast cancer ([place]) 2008   RT, chemo, lumpectomy  . Diverticulitis   . DVT (deep vein thrombosis) in pregnancy    site of PICC catheter  . HTN (hypertension)   . Hyperlipidemia   . Hypothyroidism   . Kidney tumor 2010   RFA  . Personal history of chemotherapy   . Personal history of radiation therapy   . Prediabetes   . Rheumatoid arthritis(714.0)   . UTI (lower urinary tract infection)     Past Surgical History:  Procedure Laterality Date  . ABDOMINAL SURGERY     for ruptured diverticuli  . bilateral foot surgery    . BREAST LUMPECTOMY Left   . CARDIAC CATHETERIZATION    . CERVICAL SPINE SURGERY    . LEFT HEART CATHETERIZATION WITH CORONARY ANGIOGRAM N/A 01/17/2014   Procedure: LEFT HEART CATHETERIZATION WITH CORONARY ANGIOGRAM;  Surgeon: Peter M Martinique, MD;  Location: [facility] CATH LAB;  Service: Cardiovascular;  Laterality: N/A;  . OVARIAN CYST REMOVAL    . perforation of colon    . renal RFA    . take down of colonostomy  2011  . TONSILLECTOMY      There were no vitals filed for this visit.  Subjective Assessment -  08/23/19 0930    Subjective  Pt states that things are going ok. She saw her MD who recommended another injection, but she would like to hold off on this.    Patient Stated Goals  comfortable with sleeping and daily activities and in the morning    Currently in Pain?  No/denies    Pain Onset  More than a month ago                       [facility] Adult PT Treatment/Exercise - 08/23/19 0001      Self-Care   Self-Care  Other Self-Care Comments    Other Self-Care Comments   self traction in seated and standing for home, PT demo      Lumbar Exercises: Standing   Other Standing Lumbar Exercises  BUE pressdown with red TB 2x10 reps with abdominal brace      Lumbar Exercises: Seated   Other Seated Lumbar Exercises  BUE pressdown into foam roll 10x3 sec hold       Lumbar Exercises: Supine   Bent Knee Raise  5 reps    Bent Knee Raise Limitations  x2 sets, feet resting on bolster     Other Supine Lumbar Exercises  hip abduction slide with abdominal brace 2x5  reps on Lt, 2x5 reps on Rt       Knee/Hip Exercises: Standing   Hip Abduction  Both;1 set;10 reps;Knee straight    Abduction Limitations  2# ankle weights      Knee/Hip Exercises: Seated   Clamshell with TheraBand  Red   2x10 reps             PT Education - 08/23/19 1013    Education Details  reviewed safe lifting technique, self traction at home, updates to HEP    Person(s) Educated  Patient    Methods  Explanation;Handout    Comprehension  Verbalized understanding;Returned demonstration       PT Short Term Goals - 08/23/19 1004      PT SHORT TERM GOAL #1   Title  independent with initial HEP    Time  4    Period  Weeks    Status  Achieved    Target Date  09/07/19      PT SHORT TERM GOAL #2   Title  understand what postures to avoid to decrease spinal fractures.    Baseline  verbal understanding but has issues with doing this throughout the day    Time  4    Period  Weeks    Status  Partially Met     Target Date  09/07/19      PT SHORT TERM GOAL #3   Title  ---      PT SHORT TERM GOAL #4   Title  ----        PT Long Term Goals - 08/10/19 1258      PT LONG TERM GOAL #1   Title  return demostation on correct lifting and perform daily activities with correct posture ie: sweeping, standing, doing dishes    Time  8    Period  Weeks    Status  New    Target Date  10/05/19      PT LONG TERM GOAL #2   Title  lumbar pain decreased  to manageble level to perform daily tasks with greater ease    Time  8    Period  Weeks    Status  New    Target Date  10/05/19      PT LONG TERM GOAL #3   Title  Berg balance score >/= 52/56 due to improved balance    Period  Weeks    Status  New    Target Date  10/05/19      PT LONG TERM GOAL #4   Title  FOTO score </= 41 % limitation compared to 47%    Time  8    Period  Weeks    Status  New    Target Date  10/05/19      PT LONG TERM GOAL #5   Title  sit to stand </= 15 sec due to decreased fear of falling    Time  8    Period  Weeks    Status  New    Target Date  10/05/19            Plan - 08/23/19 1014    Clinical Impression Statement  Pt is making steady progress towards her goals. She is completing her HEP consistently and is able to verbalize understanding of safe back mechanics. Pt does struggle with maintaining safe mechanics with lifting and other daily activity, and therapist was able to review this with her during today's visit. Pt demonstrates deficits in rotary  strength and required PT cuing to improve awareness of this. She denied any increase in low back pain at the end of today's visit.    Personal Factors and Comorbidities  Comorbidity 1;Comorbidity 2;Comorbidity 3+;Age;Fitness    Comorbidities  osteoporosis, breast cancer, scoliosis    Examination-Activity Limitations  Locomotion Level;Carry;Stand;Sit;Lift    Examination-Participation Restrictions  Community Activity;Cleaning    Stability/Clinical Decision  Making  Evolving/Moderate complexity    Rehab Potential  Excellent    PT Frequency  2x / week    PT Duration  8 weeks    PT Treatment/Interventions  ADLs/Self Care Home Management;Electrical Stimulation;Balance training;Therapeutic exercise;Therapeutic activities;Functional mobility training;Neuromuscular re-education;Patient/family education;Dry needling;Manual techniques    PT Next Visit Plan  progress trunk/LE strength; balance exercises for single leg stance and reaching, body mechanics with home tasks    PT Home Exercise Plan  Access Code: [email or web address]    Consulted and Agree with Plan of Care  Patient       Patient will benefit from skilled therapeutic intervention in order to improve the following deficits and impairments:  Decreased endurance, Pain, Improper body mechanics, Decreased strength  Visit Diagnosis: Chronic bilateral low back pain with left-sided sciatica  Muscle weakness (generalized)  Other idiopathic scoliosis, thoracolumbar region     Problem List Patient Active Problem List   Diagnosis Date Noted  . Hammer toe 12/24/2018  . Metatarsalgia of left foot 12/24/2018  . Degeneration of lumbar intervertebral disc 08/10/2018  . Bilateral impacted cerumen 08/07/2017  . Sensorineural hearing loss (SNHL) of both ears 08/07/2017  . Dyspnea on exertion 12/30/2013  . Chest pain on exertion 12/30/2013  . HTN (hypertension) 12/30/2013  . Hyperlipidemia 12/30/2013  . Rheumatoid arthritis ([place])   . Hypothyroidism   . Breast cancer ([place])    10:20 AM,08/23/19 Sherol Dade PT, DPT [place] [facility] [place]  [facility] [address], [place], [place], [address] Phone: [phone]   Fax:  [phone]  Name: FLORIENE JESCHKE MRN: [id number] Date of Birth: Oct 31, 1933

## 2019-08-23 NOTE — Patient Instructions (Signed)
Access Code: [email or web address]  URL: [email or web address]  Date: 08/23/2019  Prepared by: Sherol Dade   Exercises  Hooklying Clamshell with Resistance - 10 reps - 2 sets - 1x daily - 7x weekly  Supine Double Knee to Chest - 5 reps - 5 hold - 1x daily - 7x weekly  Shoulder extension with resistance - Neutral - 10 reps - 2 sets - 1x daily - 7x weekly  Patient Education  Managing Back Pain  Low Back Pain Handout  Low Back Pain  How to Prevent Falls  [place] [facility] [address] [place], [place] [address] Phone # [phone] Fax [phone]

## 2019-08-25 ENCOUNTER — Other Ambulatory Visit: Payer: Self-pay

## 2019-08-25 ENCOUNTER — Ambulatory Visit: Payer: Medicare Other | Admitting: Physical Therapy

## 2019-08-25 DIAGNOSIS — M4125 Other idiopathic scoliosis, thoracolumbar region: Secondary | ICD-10-CM | POA: Diagnosis not present

## 2019-08-25 DIAGNOSIS — G8929 Other chronic pain: Secondary | ICD-10-CM | POA: Diagnosis not present

## 2019-08-25 DIAGNOSIS — M6281 Muscle weakness (generalized): Secondary | ICD-10-CM | POA: Diagnosis not present

## 2019-08-25 DIAGNOSIS — M5442 Lumbago with sciatica, left side: Secondary | ICD-10-CM

## 2019-08-25 NOTE — Therapy (Signed)
[facility] [address] [place], [place], [address] Phone: [phone]   Fax:  [phone]  Physical Therapy Treatment  Patient Details  Name: [name] MRN: [id number] Date of Birth: 1934-04-20 Referring Provider (PT): Dr. Jean Rosenthal   Encounter Date: 08/25/2019  PT End of Session - 08/25/19 1009    Visit Number  5    Date for PT Re-Evaluation  10/05/19    Authorization Type  Medicare    PT Start Time  0930    PT Stop Time  1009    PT Time Calculation (min)  39 min    Activity Tolerance  Patient tolerated treatment well;No increased pain    Behavior During Therapy  WFL for tasks assessed/performed       Past Medical History:  Diagnosis Date  . Abnormal cardiovascular stress test 12/2013   s/p cath with mild nonobstructive CAD normal EF - managed medically  . Breast cancer ([place]) 2008   RT, chemo, lumpectomy  . Diverticulitis   . DVT (deep vein thrombosis) in pregnancy    site of PICC catheter  . HTN (hypertension)   . Hyperlipidemia   . Hypothyroidism   . Kidney tumor 2010   RFA  . Personal history of chemotherapy   . Personal history of radiation therapy   . Prediabetes   . Rheumatoid arthritis(714.0)   . UTI (lower urinary tract infection)     Past Surgical History:  Procedure Laterality Date  . ABDOMINAL SURGERY     for ruptured diverticuli  . bilateral foot surgery    . BREAST LUMPECTOMY Left   . CARDIAC CATHETERIZATION    . CERVICAL SPINE SURGERY    . LEFT HEART CATHETERIZATION WITH CORONARY ANGIOGRAM N/A 01/17/2014   Procedure: LEFT HEART CATHETERIZATION WITH CORONARY ANGIOGRAM;  Surgeon: Peter M Martinique, MD;  Location: [facility] CATH LAB;  Service: Cardiovascular;  Laterality: N/A;  . OVARIAN CYST REMOVAL    . perforation of colon    . renal RFA    . take down of colonostomy  2011  . TONSILLECTOMY      There were no vitals filed for this visit.  Subjective Assessment -  08/25/19 0959    Subjective  Pt has no complaints at this time.    Patient Stated Goals  comfortable with sleeping and daily activities and in the morning    Currently in Pain?  No/denies    Pain Onset  More than a month ago                       [facility] Adult PT Treatment/Exercise - 08/25/19 0001      Self-Care   Self-Care  Posture    Posture  raking technique review, discussed importance of taking brakes as needed      Lumbar Exercises: Stretches   Double Knee to Chest Stretch Limitations  x10 reps with LE on red physioball     Figure 4 Stretch  2 reps;30 seconds    Figure 4 Stretch Limitations  seated       Lumbar Exercises: Supine   Straight Leg Raise  5 reps    Straight Leg Raises Limitations  x2 sets, posterior pelvic tilt      Nustep L2 x6 min, PT present to discuss HEP adjustments     Balance Exercises - 08/25/19 1000      Balance Exercises: Standing   Rebounder  Tandem   1 minute weight shifting  Other Standing Exercises  floor ladder: walking forward LE in each square x2 trials, sidestepping x2 trials each direction, walking forward to cones and backwards to start x3, walking forward weaving in/out of cones placed on ladder        PT Education - 08/25/19 1012    Education Details  self care; HEP theraband adjustment    Person(s) Educated  Patient    Methods  Explanation    Comprehension  Verbalized understanding       PT Short Term Goals - 08/23/19 1004      PT SHORT TERM GOAL #1   Title  independent with initial HEP    Time  4    Period  Weeks    Status  Achieved    Target Date  09/07/19      PT SHORT TERM GOAL #2   Title  understand what postures to avoid to decrease spinal fractures.    Baseline  verbal understanding but has issues with doing this throughout the day    Time  4    Period  Weeks    Status  Partially Met    Target Date  09/07/19      PT SHORT TERM GOAL #3   Title  ---      PT SHORT TERM GOAL #4   Title  ----         PT Long Term Goals - 08/10/19 1258      PT LONG TERM GOAL #1   Title  return demostation on correct lifting and perform daily activities with correct posture ie: sweeping, standing, doing dishes    Time  8    Period  Weeks    Status  New    Target Date  10/05/19      PT LONG TERM GOAL #2   Title  lumbar pain decreased  to manageble level to perform daily tasks with greater ease    Time  8    Period  Weeks    Status  New    Target Date  10/05/19      PT LONG TERM GOAL #3   Title  Berg balance score >/= 52/56 due to improved balance    Period  Weeks    Status  New    Target Date  10/05/19      PT LONG TERM GOAL #4   Title  FOTO score </= 41 % limitation compared to 47%    Time  8    Period  Weeks    Status  New    Target Date  10/05/19      PT LONG TERM GOAL #5   Title  sit to stand </= 15 sec due to decreased fear of falling    Time  8    Period  Weeks    Status  New    Target Date  10/05/19            Plan - 08/25/19 1009    Clinical Impression Statement  Pt did well with today's balance activity. She was able to complete dynamic activity with the floor ladder without any LOB or noted unsteadiness. PT instructed pt in safe ways to complete yard work/raking without excessive trunk rotation. She was able to demonstrate understanding of this. Ended with gentle stretching and HEP adjustment providing yellow TB for pulldowns so that pt can complete the recommended sets/reps without difficulty. Ended without reports of low back pain.    Personal Factors and Comorbidities  Comorbidity 1;Comorbidity 2;Comorbidity 3+;Age;Fitness    Comorbidities  osteoporosis, breast cancer, scoliosis    Examination-Activity Limitations  Locomotion Level;Carry;Stand;Sit;Lift    Examination-Participation Restrictions  Community Activity;Cleaning    Stability/Clinical Decision Making  Evolving/Moderate complexity    Rehab Potential  Excellent    PT Frequency  2x / week    PT  Duration  8 weeks    PT Treatment/Interventions  ADLs/Self Care Home Management;Electrical Stimulation;Balance training;Therapeutic exercise;Therapeutic activities;Functional mobility training;Neuromuscular re-education;Patient/family education;Dry needling;Manual techniques    PT Next Visit Plan  progress trunk/LE strength; balance exercises for single leg stance and reaching, continue to educate on body mechanics with home tasks    PT Home Exercise Plan  Access Code: [email or web address]    Consulted and Agree with Plan of Care  Patient       Patient will benefit from skilled therapeutic intervention in order to improve the following deficits and impairments:  Decreased endurance, Pain, Improper body mechanics, Decreased strength  Visit Diagnosis: Chronic bilateral low back pain with left-sided sciatica  Muscle weakness (generalized)     Problem List Patient Active Problem List   Diagnosis Date Noted  . Hammer toe 12/24/2018  . Metatarsalgia of left foot 12/24/2018  . Degeneration of lumbar intervertebral disc 08/10/2018  . Bilateral impacted cerumen 08/07/2017  . Sensorineural hearing loss (SNHL) of both ears 08/07/2017  . Dyspnea on exertion 12/30/2013  . Chest pain on exertion 12/30/2013  . HTN (hypertension) 12/30/2013  . Hyperlipidemia 12/30/2013  . Rheumatoid arthritis ([place])   . Hypothyroidism   . Breast cancer [facility])     10:15 AM,08/25/19 Sherol Dade PT, DPT [facility] [address] [place], [place], [address] Phone: [phone]   Fax:  [phone]  Name: [name] MRN: [id number] Date of Birth: [birth year]

## 2019-08-30 ENCOUNTER — Other Ambulatory Visit: Payer: Self-pay

## 2019-08-30 ENCOUNTER — Ambulatory Visit: Payer: Medicare Other | Admitting: Physical Therapy

## 2019-08-30 ENCOUNTER — Encounter: Payer: Self-pay | Admitting: Physical Therapy

## 2019-08-30 DIAGNOSIS — G8929 Other chronic pain: Secondary | ICD-10-CM | POA: Diagnosis not present

## 2019-08-30 DIAGNOSIS — M6281 Muscle weakness (generalized): Secondary | ICD-10-CM | POA: Diagnosis not present

## 2019-08-30 DIAGNOSIS — M5442 Lumbago with sciatica, left side: Secondary | ICD-10-CM | POA: Diagnosis not present

## 2019-08-30 DIAGNOSIS — M4125 Other idiopathic scoliosis, thoracolumbar region: Secondary | ICD-10-CM

## 2019-08-30 NOTE — Therapy (Signed)
[facility] [address] [place], [place], [address] Phone: [phone]   Fax:  [phone]  Physical Therapy Treatment  Patient Details  Name: [name] MRN: [id number] Date of Birth: 11-Sep-1934 Referring Provider (PT): Dr. Jean Rosenthal   Encounter Date: 08/30/2019  PT End of Session - 08/30/19 1114    Visit Number  6    Date for PT Re-Evaluation  10/05/19    Authorization Type  Medicare    PT Start Time  1100    PT Stop Time  1140    PT Time Calculation (min)  40 min    Activity Tolerance  Patient tolerated treatment well;No increased pain    Behavior During Therapy  WFL for tasks assessed/performed       Past Medical History:  Diagnosis Date  . Abnormal cardiovascular stress test 12/2013   s/p cath with mild nonobstructive CAD normal EF - managed medically  . Breast cancer ([place]) 2008   RT, chemo, lumpectomy  . Diverticulitis   . DVT (deep vein thrombosis) in pregnancy    site of PICC catheter  . HTN (hypertension)   . Hyperlipidemia   . Hypothyroidism   . Kidney tumor 2010   RFA  . Personal history of chemotherapy   . Personal history of radiation therapy   . Prediabetes   . Rheumatoid arthritis(714.0)   . UTI (lower urinary tract infection)     Past Surgical History:  Procedure Laterality Date  . ABDOMINAL SURGERY     for ruptured diverticuli  . bilateral foot surgery    . BREAST LUMPECTOMY Left   . CARDIAC CATHETERIZATION    . CERVICAL SPINE SURGERY    . LEFT HEART CATHETERIZATION WITH CORONARY ANGIOGRAM N/A 01/17/2014   Procedure: LEFT HEART CATHETERIZATION WITH CORONARY ANGIOGRAM;  Surgeon: Peter M Martinique, MD;  Location: [facility] CATH LAB;  Service: Cardiovascular;  Laterality: N/A;  . OVARIAN CYST REMOVAL    . perforation of colon    . renal RFA    . take down of colonostomy  2011  . TONSILLECTOMY      There were no vitals filed for this visit.  Subjective Assessment -  08/30/19 1103    Subjective  I had a bad time on friday and saturday with muscle spasms. I do better with 5 exercises at a time instead of 10.    Patient Stated Goals  comfortable with sleeping and daily activities and in the morning    Currently in Pain?  Yes    Pain Score  7     Pain Location  Back    Pain Orientation  Lower    Pain Descriptors / Indicators  Sharp;Radiating    Pain Type  Chronic pain    Pain Onset  More than a month ago    Pain Frequency  Intermittent    Aggravating Factors   sitting and turning to turn light off to the left, static standing to talk to neighbor    Pain Relieving Factors  walk faster, keep movint    Multiple Pain Sites  No         [facility] PT Assessment - 08/30/19 0001      Assessment   Medical Diagnosis  M41.9 Scoliosis, unspecified scoliosis type, unspecified spinal region    Referring Provider (PT)  Dr. Jean Rosenthal    Onset Date/Surgical Date  --   chronic   Prior Therapy  none      Precautions  Precautions  Other (comment)    Precaution Comments  history of breast cancer      Restrictions   Weight Bearing Restrictions  No      Strength   Right Hip Extension  3+/5    Right Hip ABduction  3+/5    Left Hip Extension  4/5    Left Hip ABduction  4/5    Right Knee Flexion  4+/5    Left Knee Flexion  4+/5                   [facility] Adult PT Treatment/Exercise - 08/30/19 0001      Lumbar Exercises: Stretches   Double Knee to Chest Stretch Limitations  x10 reps with LE on red physioball       Lumbar Exercises: Aerobic   Nustep  7 minutes on level 1 while assessing patient      Lumbar Exercises: Supine   Heel Slides  20 reps    Heel Slides Limitations  with foot on ball and abdominal bracing    Bridge  10 reps    Bridge Limitations  VC to squeeze the buttocks    Straight Leg Raise  5 reps    Straight Leg Raises Limitations  x2 sets, posterior pelvic tilt       Knee/Hip Exercises: Standing   Hip Abduction  Both;1  set;10 reps;Knee straight    Abduction Limitations  2# ankle weights   tactile cues to keep upright   Hip Extension  Stengthening;Right;Left;1 set;10 reps;Knee straight    Extension Limitations  2# on each ankle      Knee/Hip Exercises: Prone   Straight Leg Raises  Strengthening;Right;Left;2 sets;5 reps    Straight Leg Raises Limitations  with assistance               PT Short Term Goals - 08/23/19 1004      PT SHORT TERM GOAL #1   Title  independent with initial HEP    Time  4    Period  Weeks    Status  Achieved    Target Date  09/07/19      PT SHORT TERM GOAL #2   Title  understand what postures to avoid to decrease spinal fractures.    Baseline  verbal understanding but has issues with doing this throughout the day    Time  4    Period  Weeks    Status  Partially Met    Target Date  09/07/19      PT SHORT TERM GOAL #3   Title  ---      PT SHORT TERM GOAL #4   Title  ----        PT Long Term Goals - 08/30/19 1128      PT LONG TERM GOAL #1   Title  return demostation on correct lifting and perform daily activities with correct posture ie: sweeping, standing, doing dishes    Time  8    Period  Weeks    Status  On-going      PT LONG TERM GOAL #2   Title  lumbar pain decreased  to manageble level to perform daily tasks with greater ease    Baseline  patient had muscle spasms over the weekend    Time  8    Period  Weeks    Status  On-going      PT LONG TERM GOAL #3   Title  Berg balance score >/= 52/56 due  to improved balance    Time  8    Period  Weeks    Status  On-going            Plan - 08/30/19 1113    Clinical Impression Statement  Patient has had spasms in her back and legs if she does too many exercises at one time. Patient will do things quickly and not think about her posture. Patient needs verbal cues with standing exercise to keep her trunk upright. Patient is working on her core strength this session. Patient will benefit from  skilled therapy to improve trunk strength and understand ways to perfrom activities with good body mechanics.    Personal Factors and Comorbidities  Comorbidity 1;Comorbidity 2;Comorbidity 3+;Age;Fitness    Comorbidities  osteoporosis, breast cancer, scoliosis    Examination-Activity Limitations  Locomotion Level;Carry;Stand;Sit;Lift    Examination-Participation Restrictions  Community Activity;Cleaning    Stability/Clinical Decision Making  Evolving/Moderate complexity    Rehab Potential  Excellent    PT Frequency  2x / week    PT Duration  8 weeks    PT Treatment/Interventions  ADLs/Self Care Home Management;Electrical Stimulation;Balance training;Therapeutic exercise;Therapeutic activities;Functional mobility training;Neuromuscular re-education;Patient/family education;Dry needling;Manual techniques    PT Next Visit Plan  progress trunk/LE strength; balance exercises for single leg stance and reaching, continue to educate on body mechanics with home tasks    PT Home Exercise Plan  Access Code: [email or web address]    Recommended Other Services  MD signed initial eval    Consulted and Agree with Plan of Care  Patient       Patient will benefit from skilled therapeutic intervention in order to improve the following deficits and impairments:  Decreased endurance, Pain, Improper body mechanics, Decreased strength  Visit Diagnosis: Chronic bilateral low back pain with left-sided sciatica  Muscle weakness (generalized)  Other idiopathic scoliosis, thoracolumbar region     Problem List Patient Active Problem List   Diagnosis Date Noted  . Hammer toe 12/24/2018  . Metatarsalgia of left foot 12/24/2018  . Degeneration of lumbar intervertebral disc 08/10/2018  . Bilateral impacted cerumen 08/07/2017  . Sensorineural hearing loss (SNHL) of both ears 08/07/2017  . Dyspnea on exertion 12/30/2013  . Chest pain on exertion 12/30/2013  . HTN (hypertension) 12/30/2013  . Hyperlipidemia 12/30/2013  .  Rheumatoid arthritis ([place])   . Hypothyroidism   . Breast cancer [facility])     Earlie Counts, PT 08/30/19 11:53 AM   [facility] [address], [place], [place], [address] Phone: [phone]   Fax:  [phone]  Name: [name] MRN: [id number] Date of Birth: [birth year]

## 2019-09-01 ENCOUNTER — Encounter: Payer: Self-pay | Admitting: Physical Therapy

## 2019-09-01 ENCOUNTER — Ambulatory Visit: Payer: Medicare Other | Admitting: Physical Therapy

## 2019-09-01 ENCOUNTER — Other Ambulatory Visit: Payer: Self-pay

## 2019-09-01 DIAGNOSIS — M5442 Lumbago with sciatica, left side: Secondary | ICD-10-CM

## 2019-09-01 DIAGNOSIS — G8929 Other chronic pain: Secondary | ICD-10-CM

## 2019-09-01 DIAGNOSIS — M4125 Other idiopathic scoliosis, thoracolumbar region: Secondary | ICD-10-CM

## 2019-09-01 DIAGNOSIS — M6281 Muscle weakness (generalized): Secondary | ICD-10-CM | POA: Diagnosis not present

## 2019-09-01 NOTE — Therapy (Signed)
[facility] [address], [place], [place], [address] Phone: [phone]   Fax:  [phone]  Physical Therapy Treatment  Patient Details  Name: [name] MRN: [id number] Date of Birth: [birth year] Referring Provider (PT): Dr. Jean Rosenthal   Encounter Date: 09/01/2019  PT End of Session - 09/01/19 1133    Visit Number  7    Date for PT Re-Evaluation  10/05/19    Authorization Type  Medicare    PT Start Time  1100    PT Stop Time  1138    PT Time Calculation (min)  38 min    Activity Tolerance  Patient tolerated treatment well;No increased pain    Behavior During Therapy  WFL for tasks assessed/performed       Past Medical History:  Diagnosis Date  . Abnormal cardiovascular stress test 12/2013   s/p cath with mild nonobstructive CAD normal EF - managed medically  . Breast cancer ([place]) 2008   RT, chemo, lumpectomy  . Diverticulitis   . DVT (deep vein thrombosis) in pregnancy    site of PICC catheter  . HTN (hypertension)   . Hyperlipidemia   . Hypothyroidism   . Kidney tumor 2010   RFA  . Personal history of chemotherapy   . Personal history of radiation therapy   . Prediabetes   . Rheumatoid arthritis(714.0)   . UTI (lower urinary tract infection)     Past Surgical History:  Procedure Laterality Date  . ABDOMINAL SURGERY     for ruptured diverticuli  . bilateral foot surgery    . BREAST LUMPECTOMY Left   . CARDIAC CATHETERIZATION    . CERVICAL SPINE SURGERY    . LEFT HEART CATHETERIZATION WITH CORONARY ANGIOGRAM N/A 01/17/2014   Procedure: LEFT HEART CATHETERIZATION WITH CORONARY ANGIOGRAM;  Surgeon: Peter M Martinique, MD;  Location: [facility] CATH LAB;  Service: Cardiovascular;  Laterality: N/A;  . OVARIAN CYST REMOVAL    . perforation of colon    . renal RFA    . take down of colonostomy  2011  . TONSILLECTOMY      There were no vitals filed for this visit.  Subjective Assessment -  09/01/19 1104    Subjective  I felt great on Tuesday after therapy. I  lifted a big thing at the house. My left thigh is painful and my two hips hurt when I go up. l    Patient Stated Goals  comfortable with sleeping and daily activities and in the morning    Currently in Pain?  Yes    Pain Score  5     Pain Location  Back    Pain Orientation  Lower    Pain Descriptors / Indicators  Sharp;Radiating    Pain Type  Chronic pain    Pain Radiating Towards  can radiate to the left heel or hips    Pain Onset  More than a month ago    Pain Frequency  Intermittent    Aggravating Factors   sitting and turning to turn light off to the left, static standing to talk to neighbor    Pain Relieving Factors  walk faster, keep moving    Multiple Pain Sites  No         [facility] PT Assessment - 09/01/19 0001      Assessment   Medical Diagnosis  M41.9 Scoliosis, unspecified scoliosis type, unspecified spinal region    Referring Provider (PT)  Dr. Jean Rosenthal  Onset Date/Surgical Date  --   chronic   Prior Therapy  none      Precautions   Precautions  Other (comment)    Precaution Comments  history of breast cancer                   [facility] Adult PT Treatment/Exercise - 09/01/19 0001      Lumbar Exercises: Stretches   Passive Hamstring Stretch  Left;1 rep;30 seconds    Passive Hamstring Stretch Limitations  by therapist; hip adductors, ITB band    Double Knee to Chest Stretch Limitations  x20 reps with LE on red physioball     Piriformis Stretch  Left;1 rep;30 seconds    Piriformis Stretch Limitations  supine by therapist      Lumbar Exercises: Aerobic   Nustep  7 minutes on level 1 while assessing patient      Lumbar Exercises: Supine   Heel Slides  20 reps    Heel Slides Limitations  with foot on ball and abdominal bracing    Bridge  15 reps    Bridge Limitations  VC to squeeze the buttocks    Straight Leg Raise  20 reps;1 second    Straight Leg Raises Limitations   abdominal bracing      Knee/Hip Exercises: Standing   Hip Abduction  Both;1 set;10 reps;Knee straight    Abduction Limitations  2# ankle weights   tactile cues to keep upright   Hip Extension  Stengthening;Right;Left;1 set;10 reps;Knee straight    Extension Limitations  2# on each ankle      Knee/Hip Exercises: Seated   Ball Squeeze  10x5 sec hold     Clamshell with TheraBand  Red   15 with upright posture              PT Short Term Goals - 08/23/19 1004      PT SHORT TERM GOAL #1   Title  independent with initial HEP    Time  4    Period  Weeks    Status  Achieved    Target Date  09/07/19      PT SHORT TERM GOAL #2   Title  understand what postures to avoid to decrease spinal fractures.    Baseline  verbal understanding but has issues with doing this throughout the day    Time  4    Period  Weeks    Status  Partially Met    Target Date  09/07/19      PT SHORT TERM GOAL #3   Title  ---      PT SHORT TERM GOAL #4   Title  ----        PT Long Term Goals - 08/30/19 1128      PT LONG TERM GOAL #1   Title  return demostation on correct lifting and perform daily activities with correct posture ie: sweeping, standing, doing dishes    Time  8    Period  Weeks    Status  On-going      PT LONG TERM GOAL #2   Title  lumbar pain decreased  to manageble level to perform daily tasks with greater ease    Baseline  patient had muscle spasms over the weekend    Time  8    Period  Weeks    Status  On-going      PT LONG TERM GOAL #3   Title  Berg balance score >/= 52/56 due to  improved balance    Time  8    Period  Weeks    Status  On-going            Plan - 09/01/19 1134    Clinical Impression Statement  Patient had one day without pain after therapy for first time. Patient needs minimal verbal cues to keep upright with standing hip exercises. Patient needs verbalcues to engage her core with her exercises. Patinet does her exercise fast and needs verbal  cues to go slowly. Patient will benefit from skilled therapy to improve trunk strength and understands ways to perform activities with good body mechanics.    Personal Factors and Comorbidities  Comorbidity 1;Comorbidity 2;Comorbidity 3+;Age;Fitness    Comorbidities  osteoporosis, breast cancer, scoliosis    Examination-Activity Limitations  Locomotion Level;Carry;Stand;Sit;Lift    Examination-Participation Restrictions  Community Activity;Cleaning    Stability/Clinical Decision Making  Evolving/Moderate complexity    Rehab Potential  Excellent    PT Frequency  2x / week    PT Duration  8 weeks    PT Treatment/Interventions  ADLs/Self Care Home Management;Electrical Stimulation;Balance training;Therapeutic exercise;Therapeutic activities;Functional mobility training;Neuromuscular re-education;Patient/family education;Dry needling;Manual techniques    PT Next Visit Plan  progress trunk/LE strength; balance exercises for single leg stance and reaching, continue to educate on body mechanics with home tasks    PT Home Exercise Plan  Access Code: [email or web address]    Consulted and Agree with Plan of Care  Patient       Patient will benefit from skilled therapeutic intervention in order to improve the following deficits and impairments:  Decreased endurance, Pain, Improper body mechanics, Decreased strength  Visit Diagnosis: Chronic bilateral low back pain with left-sided sciatica  Muscle weakness (generalized)  Other idiopathic scoliosis, thoracolumbar region     Problem List Patient Active Problem List   Diagnosis Date Noted  . Hammer toe 12/24/2018  . Metatarsalgia of left foot 12/24/2018  . Degeneration of lumbar intervertebral disc 08/10/2018  . Bilateral impacted cerumen 08/07/2017  . Sensorineural hearing loss (SNHL) of both ears 08/07/2017  . Dyspnea on exertion 12/30/2013  . Chest pain on exertion 12/30/2013  . HTN (hypertension) 12/30/2013  . Hyperlipidemia 12/30/2013  . Rheumatoid  arthritis ([place])   . Hypothyroidism   . Breast cancer [facility])     Earlie Counts, PT 09/01/19 11:39 AM   [facility] [address] [place], [place], [address] Phone: [phone]   Fax:  [phone]  Name: [name] MRN: [id number] Date of Birth: 1934-01-12

## 2019-09-05 DIAGNOSIS — M2550 Pain in unspecified joint: Secondary | ICD-10-CM | POA: Diagnosis not present

## 2019-09-05 DIAGNOSIS — Z79899 Other long term (current) drug therapy: Secondary | ICD-10-CM | POA: Diagnosis not present

## 2019-09-05 DIAGNOSIS — M0579 Rheumatoid arthritis with rheumatoid factor of multiple sites without organ or systems involvement: Secondary | ICD-10-CM | POA: Diagnosis not present

## 2019-09-06 ENCOUNTER — Other Ambulatory Visit: Payer: Self-pay

## 2019-09-06 ENCOUNTER — Ambulatory Visit: Payer: Medicare Other | Admitting: Physical Therapy

## 2019-09-06 ENCOUNTER — Encounter: Payer: Self-pay | Admitting: Physical Therapy

## 2019-09-06 DIAGNOSIS — M6281 Muscle weakness (generalized): Secondary | ICD-10-CM | POA: Diagnosis not present

## 2019-09-06 DIAGNOSIS — G8929 Other chronic pain: Secondary | ICD-10-CM | POA: Diagnosis not present

## 2019-09-06 DIAGNOSIS — M4125 Other idiopathic scoliosis, thoracolumbar region: Secondary | ICD-10-CM | POA: Diagnosis not present

## 2019-09-06 DIAGNOSIS — M5442 Lumbago with sciatica, left side: Secondary | ICD-10-CM

## 2019-09-06 NOTE — Therapy (Signed)
[facility] [address], [place], [place], [address] Phone: [phone]   Fax:  [phone]  Physical Therapy Treatment  Patient Details  Name: [name] MRN: [id number] Date of Birth: 22-Nov-1933 Referring Provider (PT): Dr. Jean Rosenthal   Encounter Date: 09/06/2019  PT End of Session - 09/06/19 1106    Visit Number  8    Date for PT Re-Evaluation  10/05/19    Authorization Type  Medicare    PT Start Time  1100    PT Stop Time  1140    PT Time Calculation (min)  40 min    Activity Tolerance  Patient tolerated treatment well;No increased pain    Behavior During Therapy  WFL for tasks assessed/performed       Past Medical History:  Diagnosis Date  . Abnormal cardiovascular stress test 12/2013   s/p cath with mild nonobstructive CAD normal EF - managed medically  . Breast cancer ([place]) 2008   RT, chemo, lumpectomy  . Diverticulitis   . DVT (deep vein thrombosis) in pregnancy    site of PICC catheter  . HTN (hypertension)   . Hyperlipidemia   . Hypothyroidism   . Kidney tumor 2010   RFA  . Personal history of chemotherapy   . Personal history of radiation therapy   . Prediabetes   . Rheumatoid arthritis(714.0)   . UTI (lower urinary tract infection)     Past Surgical History:  Procedure Laterality Date  . ABDOMINAL SURGERY     for ruptured diverticuli  . bilateral foot surgery    . BREAST LUMPECTOMY Left   . CARDIAC CATHETERIZATION    . CERVICAL SPINE SURGERY    . LEFT HEART CATHETERIZATION WITH CORONARY ANGIOGRAM N/A 01/17/2014   Procedure: LEFT HEART CATHETERIZATION WITH CORONARY ANGIOGRAM;  Surgeon: Peter M Martinique, MD;  Location: [facility] CATH LAB;  Service: Cardiovascular;  Laterality: N/A;  . OVARIAN CYST REMOVAL    . perforation of colon    . renal RFA    . take down of colonostomy  2011  . TONSILLECTOMY      There were no vitals filed for this visit.  Subjective Assessment -  09/06/19 1108    Subjective  I was hurting after last visit. I want to take it easy today. Ihad trouble getting off the toilet.    Patient Stated Goals  comfortable with sleeping and daily activities and in the morning    Currently in Pain?  Yes    Pain Score  8     Pain Location  Back    Pain Orientation  Lower    Pain Descriptors / Indicators  Sharp;Radiating    Pain Type  Chronic pain    Pain Onset  More than a month ago    Pain Frequency  Intermittent    Aggravating Factors   sitting and turning to turn light off to the left, static standing to talk to neighbor    Pain Relieving Factors  walk faster, keep moving    Multiple Pain Sites  No         [facility] PT Assessment - 09/06/19 0001      Assessment   Medical Diagnosis  M41.9 Scoliosis, unspecified scoliosis type, unspecified spinal region    Referring Provider (PT)  Dr. Jean Rosenthal    Onset Date/Surgical Date  --   chronic   Prior Therapy  none      Precautions   Precautions  Other (comment)  Precaution Comments  history of breast cancer      Strength   Right Knee Flexion  5/5    Left Knee Flexion  5/5                   [facility] Adult PT Treatment/Exercise - 09/06/19 0001      Lumbar Exercises: Aerobic   Nustep  7 minutes on level 2 while assessing patient      Lumbar Exercises: Supine   Heel Slides  20 reps   lumbar roll under back   Heel Slides Limitations  with foot on ball and abdominal bracing    Bridge  15 reps    Bridge Limitations  with feet dorsiflexed    Other Supine Lumbar Exercises  hookly press hands into mat 5 sec with abdominal bracing 10x    Other Supine Lumbar Exercises  hookly move red plyoball 10x up and down and side to side with abdominal bracing      Knee/Hip Exercises: Standing   Hip Abduction  Stengthening;Right;Left;1 set;5 reps;Knee straight    Abduction Limitations  2# ankle weights    Hip Extension  Stengthening;Right;Left;1 set;5 reps    Extension Limitations   2# on each ankle      Knee/Hip Exercises: Seated   Ball Squeeze  10x5 sec hold    roll on back and cushion under feet   Clamshell with TheraBand  Red   15x  with upright posture              PT Short Term Goals - 09/06/19 1143      PT SHORT TERM GOAL #1   Title  independent with initial HEP    Time  4    Period  Weeks    Status  Achieved      PT SHORT TERM GOAL #2   Title  understand what postures to avoid to decrease spinal fractures.    Time  4    Period  Weeks    Status  Achieved      PT SHORT TERM GOAL #3   Baseline  ---        PT Long Term Goals - 08/30/19 1128      PT LONG TERM GOAL #1   Title  return demostation on correct lifting and perform daily activities with correct posture ie: sweeping, standing, doing dishes    Time  8    Period  Weeks    Status  On-going      PT LONG TERM GOAL #2   Title  lumbar pain decreased  to manageble level to perform daily tasks with greater ease    Baseline  patient had muscle spasms over the weekend    Time  8    Period  Weeks    Status  On-going      PT LONG TERM GOAL #3   Title  Berg balance score >/= 52/56 due to improved balance    Time  8    Period  Weeks    Status  On-going            Plan - 09/06/19 1139    Clinical Impression Statement  Patient was not able to do as much due to having increased pain from last visit. During exercises patient used a towel roll for lumbar support and abdominal bracing. Patient did 5 reps with hip exercises compared to 10x. Patient did not have increased pain after session. Patient has increased bilateral hip flexion.  Patient will benefit from skilled therapy to improve trunk strength and understand ways to perform activities with good body mechanics.    Examination-Activity Limitations  Locomotion Level;Carry;Stand;Sit;Lift    Examination-Participation Restrictions  Community Activity;Cleaning    Stability/Clinical Decision Making  Evolving/Moderate complexity     Rehab Potential  Excellent    PT Frequency  2x / week    PT Duration  8 weeks    PT Treatment/Interventions  ADLs/Self Care Home Management;Electrical Stimulation;Balance training;Therapeutic exercise;Therapeutic activities;Functional mobility training;Neuromuscular re-education;Patient/family education;Dry needling;Manual techniques    PT Next Visit Plan  progress trunk/LE strength; balance exercises for single leg stance and reaching, continue to educate on body mechanics with home tasks; 2 sessions left    PT Home Exercise Plan  Access Code: [email or web address]    Consulted and Agree with Plan of Care  Patient       Patient will benefit from skilled therapeutic intervention in order to improve the following deficits and impairments:  Decreased endurance, Pain, Improper body mechanics, Decreased strength  Visit Diagnosis: Chronic bilateral low back pain with left-sided sciatica  Muscle weakness (generalized)  Other idiopathic scoliosis, thoracolumbar region     Problem List Patient Active Problem List   Diagnosis Date Noted  . Hammer toe 12/24/2018  . Metatarsalgia of left foot 12/24/2018  . Degeneration of lumbar intervertebral disc 08/10/2018  . Bilateral impacted cerumen 08/07/2017  . Sensorineural hearing loss (SNHL) of both ears 08/07/2017  . Dyspnea on exertion 12/30/2013  . Chest pain on exertion 12/30/2013  . HTN (hypertension) 12/30/2013  . Hyperlipidemia 12/30/2013  . Rheumatoid arthritis ([place])   . Hypothyroidism   . Breast cancer [facility])     Earlie Counts, PT 09/06/19 11:44 AM   [facility] [address], [place], [place], [address] Phone: [phone]   Fax:  [phone]  Name: [name] MRN: [id number] Date of Birth: December 12, 1933

## 2019-09-13 ENCOUNTER — Encounter: Payer: Self-pay | Admitting: Physical Therapy

## 2019-09-13 ENCOUNTER — Ambulatory Visit: Payer: Medicare Other | Attending: Orthopaedic Surgery | Admitting: Physical Therapy

## 2019-09-13 ENCOUNTER — Other Ambulatory Visit: Payer: Self-pay

## 2019-09-13 DIAGNOSIS — M6281 Muscle weakness (generalized): Secondary | ICD-10-CM | POA: Insufficient documentation

## 2019-09-13 DIAGNOSIS — G8929 Other chronic pain: Secondary | ICD-10-CM | POA: Diagnosis present

## 2019-09-13 DIAGNOSIS — M5442 Lumbago with sciatica, left side: Secondary | ICD-10-CM | POA: Diagnosis not present

## 2019-09-13 DIAGNOSIS — M4125 Other idiopathic scoliosis, thoracolumbar region: Secondary | ICD-10-CM | POA: Insufficient documentation

## 2019-09-13 NOTE — Patient Instructions (Signed)
Access Code: [email or web address]  URL: [email or web address]  Date: 09/13/2019  Prepared by: Earlie Counts   Exercises Hooklying Clamshell with Resistance - 10 reps - 2 sets - 1x daily - 7x weekly Supine Double Knee to Chest - 5 reps - 5 hold - 1x daily - 7x weekly Shoulder extension with resistance - Neutral - 10 reps - 2 sets - 1x daily - 7x weekly Supine Heel Slide with Small Ball - 20 reps - 1 sets - 1x daily - 7x weekly Dead Bug - 10 reps - 1 sets - 1x daily - 7x weekly Supine Bridge with Mini Swiss Ball Between Knees - 10 reps - 1 sets - 1x daily - 7x weekly Standing Single Leg Stance with Counter Support - 5 reps - 1 sets                   - 10 sec hold - 1x daily - 7x weekly Tandem Stance with Support - 3 reps - 1 sets - 30 sec hold - 1x daily - 7x weekly Patient Education Managing Back Pain Low Back Pain Handout Low Back Pain How to Prevent [place] at [facility] [address], [place], [place] [address] Phone # [phone] Fax [phone]

## 2019-09-13 NOTE — Therapy (Signed)
[facility] [address] [name], [place], [address] Phone: [phone]   Fax:  [phone]  Physical Therapy Treatment  Patient Details  Name: [name] MRN: [id number] Date of Birth: 12-29-1933 Referring Provider (PT): Dr. Jean Rosenthal   Encounter Date: 09/13/2019  PT End of Session - 09/13/19 1108    Visit Number  9    Date for PT Re-Evaluation  10/05/19    Authorization Type  Medicare    PT Start Time  1100    PT Stop Time  1140    PT Time Calculation (min)  40 min    Activity Tolerance  Patient tolerated treatment well;No increased pain    Behavior During Therapy  WFL for tasks assessed/performed       Past Medical History:  Diagnosis Date  . Abnormal cardiovascular stress test 12/2013   s/p cath with mild nonobstructive CAD normal EF - managed medically  . Breast cancer ([place]) 2008   RT, chemo, lumpectomy  . Diverticulitis   . DVT (deep vein thrombosis) in pregnancy    site of PICC catheter  . HTN (hypertension)   . Hyperlipidemia   . Hypothyroidism   . Kidney tumor 2010   RFA  . Personal history of chemotherapy   . Personal history of radiation therapy   . Prediabetes   . Rheumatoid arthritis(714.0)   . UTI (lower urinary tract infection)     Past Surgical History:  Procedure Laterality Date  . ABDOMINAL SURGERY     for ruptured diverticuli  . bilateral foot surgery    . BREAST LUMPECTOMY Left   . CARDIAC CATHETERIZATION    . CERVICAL SPINE SURGERY    . LEFT HEART CATHETERIZATION WITH CORONARY ANGIOGRAM N/A 01/17/2014   Procedure: LEFT HEART CATHETERIZATION WITH CORONARY ANGIOGRAM;  Surgeon: Peter M Martinique, MD;  Location: [facility] CATH LAB;  Service: Cardiovascular;  Laterality: N/A;  . OVARIAN CYST REMOVAL    . perforation of colon    . renal RFA    . take down of colonostomy  2011  . TONSILLECTOMY      There were no vitals filed for this visit.  Subjective Assessment - 09/13/19  1105    Subjective  I was hurting a few days due to the weather.  No pain down to heel just to thigh.    Patient Stated Goals  comfortable with sleeping and daily activities and in the morning    Currently in Pain?  Yes    Pain Score  7     Pain Location  Back    Pain Orientation  Lower    Pain Descriptors / Indicators  Sharp;Radiating    Pain Type  Chronic pain    Pain Onset  More than a month ago    Pain Frequency  Intermittent    Aggravating Factors   sitting and turning to turn light off to the left, static standing to talk to neighbor    Pain Relieving Factors  walk faster, keep moving    Multiple Pain Sites  No         [facility] PT Assessment - 09/13/19 0001      Berg Balance Test   Sit to Stand  Able to stand without using hands and stabilize independently    Standing Unsupported  Able to stand safely 2 minutes    Sitting with Back Unsupported but Feet Supported on Floor or Stool  Able to sit safely and securely 2 minutes  Stand to Sit  Sits safely with minimal use of hands    Transfers  Able to transfer safely, minor use of hands    Standing Unsupported with Eyes Closed  Able to stand 10 seconds safely    Standing Unsupported with Feet Together  Able to place feet together independently and stand 1 minute safely    From Standing, Reach Forward with Outstretched Arm  Can reach confidently >25 cm (10")    From Standing Position, Pick up Object from Floor  Able to pick up shoe safely and easily    From Standing Position, Turn to Look Behind Over each Shoulder  Looks behind from both sides and weight shifts well   learned how to weight shift correctly   Turn 360 Degrees  Able to turn 360 degrees safely one side only in 4 seconds or less   right side to 4 sec, left to 5 sec   Standing Unsupported, Alternately Place Feet on Step/Stool  Able to stand independently and safely and complete 8 steps in 20 seconds    Standing Unsupported, One Foot in Front  Able to plae foot ahead of the  other independently and hold 30 seconds    Standing on One Leg  Able to lift leg independently and hold 5-10 seconds    Total Score  53                   [facility] Adult PT Treatment/Exercise - 09/13/19 0001      Lumbar Exercises: Aerobic   Nustep  8 minutes on level 3 while assessing patient      Lumbar Exercises: Standing   Other Standing Lumbar Exercises  turn to look behind her with lifting the foot 10 each way and reduction in back pain    Other Standing Lumbar Exercises  tandem stance 5 times each way range from 15 sec t0 20 sec      Lumbar Exercises: Seated   Sit to Stand  10 reps   no hands   Other Seated Lumbar Exercises  stand and reach forward with tactile cues to flex at the hips 10 times      Lumbar Exercises: Supine   Heel Slides  20 reps   lumbar roll under back   Heel Slides Limitations  with foot on ball and abdominal bracing    Dead Bug  20 reps;1 second    Bridge with [employer]  15 reps;1 second             PT Education - 09/13/19 1141    Education Details  Access Code: [employer]) Educated  Patient    Methods  Explanation;Demonstration;Verbal cues;Handout    Comprehension  Returned demonstration;Verbalized understanding       PT Short Term Goals - 09/06/19 1143      PT SHORT TERM GOAL #1   Title  independent with initial HEP    Time  4    Period  Weeks    Status  Achieved      PT SHORT TERM GOAL #2   Title  understand what postures to avoid to decrease spinal fractures.    Time  4    Period  Weeks    Status  Achieved      PT SHORT TERM GOAL #3   Baseline  ---        PT Long Term Goals - 09/13/19 1144      PT LONG TERM GOAL #1  Title  return demostation on correct lifting and perform daily activities with correct posture ie: sweeping, standing, doing dishes    Time  8    Period  Weeks    Status  On-going      PT LONG TERM GOAL #2   Title  lumbar pain decreased  to manageble level to perform daily tasks  with greater ease    Baseline  patient had muscle spasms over the weekend    Time  8    Period  Weeks    Status  On-going      PT LONG TERM GOAL #3   Title  Berg balance score >/= 52/56 due to improved balance    Time  8    Period  Weeks    Status  Achieved      PT LONG TERM GOAL #5   Title  sit to stand </= 15 sec due to decreased fear of falling    Time  8    Period  Weeks    Status  On-going            Plan - 09/13/19 1109    Clinical Impression Statement  Patients Berg balance has improved from 49/56 to 53/56. Patient has learned to reach forward with flexion at her hips instead of spine. Patient has learned to shift her feet when looking behind her instead of keeping her feet planted. Patient did not have increased pain with exercises. Patient will benefit from skilled therapy to improve trunk strength and understand ways to perform activities with good body mechanics.    Personal Factors and Comorbidities  Comorbidity 1;Comorbidity 2;Comorbidity 3+;Age;Fitness    Comorbidities  osteoporosis, breast cancer, scoliosis    Examination-Activity Limitations  Locomotion Level;Carry;Stand;Sit;Lift    Examination-Participation Restrictions  Community Activity;Cleaning    Stability/Clinical Decision Making  Evolving/Moderate complexity    Rehab Potential  Excellent    PT Frequency  2x / week    PT Duration  8 weeks    PT Treatment/Interventions  ADLs/Self Care Home Management;Electrical Stimulation;Balance training;Therapeutic exercise;Therapeutic activities;Functional mobility training;Neuromuscular re-education;Patient/family education;Dry needling;Manual techniques    PT Next Visit Plan  progress trunk/LE strength; balance exercises for single leg stance and reaching, continue to educate on body mechanics with home tasks;next session 10th visit    PT Home Exercise Plan  Access Code: [email or web address]    Consulted and Agree with Plan of Care  Patient       Patient will benefit from  skilled therapeutic intervention in order to improve the following deficits and impairments:  Decreased endurance, Pain, Improper body mechanics, Decreased strength  Visit Diagnosis: Chronic bilateral low back pain with left-sided sciatica  Muscle weakness (generalized)  Other idiopathic scoliosis, thoracolumbar region     Problem List Patient Active Problem List   Diagnosis Date Noted  . Hammer toe 12/24/2018  . Metatarsalgia of left foot 12/24/2018  . Degeneration of lumbar intervertebral disc 08/10/2018  . Bilateral impacted cerumen 08/07/2017  . Sensorineural hearing loss (SNHL) of both ears 08/07/2017  . Dyspnea on exertion 12/30/2013  . Chest pain on exertion 12/30/2013  . HTN (hypertension) 12/30/2013  . Hyperlipidemia 12/30/2013  . Rheumatoid arthritis ([place])   . Hypothyroidism   . Breast cancer [facility])     Earlie Counts, PT 09/13/19 11:45 AM   [facility] [address], [place], [place], [address] Phone: [phone]   Fax:  [phone]  Name: TRISTON BLAKEY MRN: [id number] Date of Birth:  01/17/1934   

## 2019-09-15 ENCOUNTER — Other Ambulatory Visit: Payer: Self-pay

## 2019-09-15 ENCOUNTER — Encounter: Payer: Self-pay | Admitting: Physical Therapy

## 2019-09-15 ENCOUNTER — Ambulatory Visit: Payer: Medicare Other | Admitting: Physical Therapy

## 2019-09-15 DIAGNOSIS — M4125 Other idiopathic scoliosis, thoracolumbar region: Secondary | ICD-10-CM

## 2019-09-15 DIAGNOSIS — M5442 Lumbago with sciatica, left side: Secondary | ICD-10-CM | POA: Diagnosis not present

## 2019-09-15 DIAGNOSIS — G8929 Other chronic pain: Secondary | ICD-10-CM

## 2019-09-15 DIAGNOSIS — M6281 Muscle weakness (generalized): Secondary | ICD-10-CM

## 2019-09-15 NOTE — Therapy (Signed)
[facility] [address] [place], [place], [address] Phone: [phone]   Fax:  [phone]  Physical Therapy Treatment  Patient Details  Name: [name] MRN: [id number] Date of Birth: 06/07/1934 Referring Provider (PT): Dr. Jean Rosenthal  Progress Note Reporting Period 08/10/19 to 09/15/19  See note below for Objective Data and Assessment of Progress/Goals.      Encounter Date: 09/15/2019  PT End of Session - 09/15/19 1139    Visit Number  10    Date for PT Re-Evaluation  10/05/19    Authorization Type  Medicare    PT Start Time  1100    PT Stop Time  1141    PT Time Calculation (min)  41 min    Activity Tolerance  Patient tolerated treatment well;No increased pain    Behavior During Therapy  WFL for tasks assessed/performed       Past Medical History:  Diagnosis Date  . Abnormal cardiovascular stress test 12/2013   s/p cath with mild nonobstructive CAD normal EF - managed medically  . Breast cancer ([place]) 2008   RT, chemo, lumpectomy  . Diverticulitis   . DVT (deep vein thrombosis) in pregnancy    site of PICC catheter  . HTN (hypertension)   . Hyperlipidemia   . Hypothyroidism   . Kidney tumor 2010   RFA  . Personal history of chemotherapy   . Personal history of radiation therapy   . Prediabetes   . Rheumatoid arthritis(714.0)   . UTI (lower urinary tract infection)     Past Surgical History:  Procedure Laterality Date  . ABDOMINAL SURGERY     for ruptured diverticuli  . bilateral foot surgery    . BREAST LUMPECTOMY Left   . CARDIAC CATHETERIZATION    . CERVICAL SPINE SURGERY    . LEFT HEART CATHETERIZATION WITH CORONARY ANGIOGRAM N/A 01/17/2014   Procedure: LEFT HEART CATHETERIZATION WITH CORONARY ANGIOGRAM;  Surgeon: Peter M Martinique, MD;  Location: [facility] CATH LAB;  Service: Cardiovascular;  Laterality: N/A;  . OVARIAN CYST REMOVAL    . perforation of colon    . renal RFA    .  take down of colonostomy  2011  . TONSILLECTOMY      There were no vitals filed for this visit.  Subjective Assessment - 09/15/19 1102    Subjective  I feel like I got alot of hints on protecting my back but the pain has  not changed. The therapy helped more in the beginning. Alot of stress in the house.    Patient Stated Goals  comfortable with sleeping and daily activities and in the morning    Currently in Pain?  Yes    Pain Score  7     Pain Location  Back    Pain Orientation  Lower    Pain Descriptors / Indicators  Radiating;Sharp    Pain Type  Chronic pain    Pain Radiating Towards  radiate to the left leg    Pain Onset  More than a month ago    Pain Frequency  Intermittent    Aggravating Factors   sitting and turning to turn light off to the left, static standing to talk to neighbor    Pain Relieving Factors  lay down on back with knees up    Multiple Pain Sites  No         [facility] PT Assessment - 09/15/19 0001      Assessment  Medical Diagnosis  M41.9 Scoliosis, unspecified scoliosis type, unspecified spinal region    Referring Provider (PT)  Dr. Jean Rosenthal    Prior Therapy  none      Precautions   Precautions  Other (comment)    Precaution Comments  history of breast cancer      Restrictions   Weight Bearing Restrictions  No      [place] residence      Prior Function   Level of [place]  Retired      Associate Professor   Overall Cognitive Status  Within Functional Limits for tasks assessed      Observation/Other Assessments   Focus on Therapeutic Outcomes (FOTO)   58% limitation      Strength   Right Hip Extension  3+/5    Right Hip ABduction  4-/5    Left Hip Extension  4/5    Left Hip ABduction  4/5    Right Knee Flexion  5/5    Left Knee Flexion  5/5                   [facility] Adult PT Treatment/Exercise - 09/15/19 0001      Lumbar Exercises: Aerobic   Nustep  8  minutes on level 3 while assessing patient      Lumbar Exercises: Supine   Heel Slides  20 reps   lumbar roll under back   Heel Slides Limitations  with foot on ball and abdominal bracing      Shoulder Exercises: Supine   Horizontal ABduction  Strengthening;Both;15 reps;Theraband    Theraband Level (Shoulder Horizontal ABduction)  Level 1 (Yellow)    External Rotation  Strengthening;Both;15 reps;Theraband   elbows at side   Theraband Level (Shoulder External Rotation)  Level 1 (Yellow)    Diagonals  Strengthening;Both;15 reps;Theraband   both ways   Theraband Level (Shoulder Diagonals)  Level 1 (Yellow)             PT Education - 09/15/19 1139    Education Details  Access Code: [email or web address]    Person(s) Educated  Patient    Methods  Explanation;Demonstration;Verbal cues;Handout    Comprehension  Returned demonstration;Verbalized understanding       PT Short Term Goals - 09/06/19 1143      PT SHORT TERM GOAL #1   Title  independent with initial HEP    Time  4    Period  Weeks    Status  Achieved      PT SHORT TERM GOAL #2   Title  understand what postures to avoid to decrease spinal fractures.    Time  4    Period  Weeks    Status  Achieved      PT SHORT TERM GOAL #3   Baseline  ---        PT Long Term Goals - 09/15/19 1124      PT LONG TERM GOAL #1   Title  return demostation on correct lifting and perform daily activities with correct posture ie: sweeping, standing, doing dishes    Time  8    Period  Weeks    Status  Achieved      PT LONG TERM GOAL #2   Title  lumbar pain decreased  to manageble level to perform daily tasks with greater ease    Time  8    Period  Weeks    Status  Not  Met      PT LONG TERM GOAL #3   Title  Berg balance score >/= 52/56 due to improved balance    Time  8    Period  Weeks    Status  Achieved      PT LONG TERM GOAL #4   Title  FOTO score </= 41 % limitation compared to 47%    Baseline  58%    Time  8    Period   Weeks    Status  Not Met            Plan - 09/15/19 1123    Clinical Impression Statement  Patient has more pain in standing activities than supine. Patient reports her left leg pain will increase more when the weather changes.Patient pain does not go down to her left heel anymore.  FOTO score went from 47% to 58% limitation. Patient [name] how to perform daily activities with correct body mechanics. Patient is independent with HEP and does reguarly.    Personal Factors and Comorbidities  Comorbidity 1;Comorbidity 2;Comorbidity 3+;Age;Fitness    Comorbidities  osteoporosis, breast cancer, scoliosis    Examination-Activity Limitations  Locomotion Level;Carry;Stand;Sit;Lift    Examination-Participation Restrictions  Community Activity;Cleaning    Stability/Clinical Decision Making  Evolving/Moderate complexity    Rehab Potential  Excellent    PT Frequency  2x / week    PT Duration  8 weeks    PT Treatment/Interventions  ADLs/Self Care Home Management;Electrical Stimulation;Balance training;Therapeutic exercise;Therapeutic activities;Functional mobility training;Neuromuscular re-education;Patient/family education;Dry needling;Manual techniques    PT Next Visit Plan  Discharge to HEP due to pain continues to increase    PT Home Exercise Plan  Access Code: [email or web address]    Consulted and Agree with Plan of Care  Patient       Patient will benefit from skilled therapeutic intervention in order to improve the following deficits and impairments:  Decreased endurance, Pain, Improper body mechanics, Decreased strength  Visit Diagnosis: Chronic bilateral low back pain with left-sided sciatica  Muscle weakness (generalized)  Other idiopathic scoliosis, thoracolumbar region     Problem List Patient Active Problem List   Diagnosis Date Noted  . Hammer toe 12/24/2018  . Metatarsalgia of left foot 12/24/2018  . Degeneration of lumbar intervertebral disc 08/10/2018  . Bilateral impacted  cerumen 08/07/2017  . Sensorineural hearing loss (SNHL) of both ears 08/07/2017  . Dyspnea on exertion 12/30/2013  . Chest pain on exertion 12/30/2013  . HTN (hypertension) 12/30/2013  . Hyperlipidemia 12/30/2013  . Rheumatoid arthritis ([place])   . Hypothyroidism   . Breast cancer [facility])     Earlie Counts, PT 09/15/19 11:44 AM   [facility] [address], [place], [place], [address] Phone: [phone]   Fax:  [phone]  Name: [name] MRN: [id number] Date of Birth: 01-Jun-1934  PHYSICAL THERAPY DISCHARGE SUMMARY  Visits from Start of Care: 10  Current functional level related to goals / functional outcomes: See above.    Remaining deficits: See above.    Education / Equipment: HEP Plan: Patient agrees to discharge.  Patient goals were partially met. Patient is being discharged due to                                                     Maximized her therapy. Thank  you for the referral. Earlie Counts, PT 09/15/19 11:45 AM  ?????

## 2019-09-15 NOTE — Patient Instructions (Signed)
Access Code: [email or web address]  URL: [email or web address]  Date: 09/15/2019  Prepared by: Earlie Counts   Exercises Hooklying Clamshell with Resistance - 10 reps - 2 sets - 1x daily - 7x weekly Supine Double Knee to Chest - 5 reps - 5 hold - 1x daily - 7x weekly Shoulder extension with resistance - Neutral - 10 reps - 2 sets - 1x daily - 7x weekly Supine Heel Slide with Small Ball - 20 reps - 1 sets - 1x daily - 7x weekly Dead Bug - 10 reps - 1 sets - 1x daily - 7x weekly Supine Bridge with Mini Swiss Ball Between Knees - 10 reps - 1 sets - 1x daily - 7x weekly Standing Single Leg Stance with Counter Support - 5 reps - 1 sets                   - 10 sec hold - 1x daily - 7x weekly Tandem Stance with Support - 3 reps - 1 sets - 30 sec hold - 1x daily - 7x weekly Supine Shoulder Horizontal Abduction with Resistance - 10 reps - 2 sets - 1x daily - 7x weekly Supine Bilateral Shoulder External Rotation with Resistance - 15 reps - 1 sets - 1x daily - 7x weekly Seated Upper Extremity Diagonals with Resistance on Swiss Ball - 10 reps - 1 sets - 1x daily - 7x weekly Patient Education Managing Back Pain Low Back Pain Handout Low Back Pain How to Prevent [place] at [facility] [address] [place], [place] [address] Phone # [phone] Fax [phone]

## 2019-09-16 DIAGNOSIS — Z8489 Family history of other specified conditions: Secondary | ICD-10-CM | POA: Diagnosis not present

## 2019-09-16 DIAGNOSIS — F330 Major depressive disorder, recurrent, mild: Secondary | ICD-10-CM | POA: Diagnosis not present

## 2019-09-16 DIAGNOSIS — I2510 Atherosclerotic heart disease of native coronary artery without angina pectoris: Secondary | ICD-10-CM | POA: Diagnosis not present

## 2019-09-16 DIAGNOSIS — M0579 Rheumatoid arthritis with rheumatoid factor of multiple sites without organ or systems involvement: Secondary | ICD-10-CM | POA: Diagnosis not present

## 2019-09-16 DIAGNOSIS — I10 Essential (primary) hypertension: Secondary | ICD-10-CM | POA: Diagnosis not present

## 2019-09-16 DIAGNOSIS — Z79899 Other long term (current) drug therapy: Secondary | ICD-10-CM | POA: Diagnosis not present

## 2019-09-16 DIAGNOSIS — Z923 Personal history of irradiation: Secondary | ICD-10-CM | POA: Diagnosis not present

## 2019-09-16 DIAGNOSIS — Z0000 Encounter for general adult medical examination without abnormal findings: Secondary | ICD-10-CM | POA: Diagnosis not present

## 2019-09-16 DIAGNOSIS — E039 Hypothyroidism, unspecified: Secondary | ICD-10-CM | POA: Diagnosis not present

## 2019-09-16 DIAGNOSIS — K219 Gastro-esophageal reflux disease without esophagitis: Secondary | ICD-10-CM | POA: Diagnosis not present

## 2019-09-16 DIAGNOSIS — D3002 Benign neoplasm of left kidney: Secondary | ICD-10-CM | POA: Diagnosis not present

## 2019-09-16 DIAGNOSIS — R7303 Prediabetes: Secondary | ICD-10-CM | POA: Diagnosis not present

## 2019-09-16 DIAGNOSIS — M810 Age-related osteoporosis without current pathological fracture: Secondary | ICD-10-CM | POA: Diagnosis not present

## 2019-09-16 DIAGNOSIS — Z853 Personal history of malignant neoplasm of breast: Secondary | ICD-10-CM | POA: Diagnosis not present

## 2019-09-16 DIAGNOSIS — F419 Anxiety disorder, unspecified: Secondary | ICD-10-CM | POA: Diagnosis not present

## 2019-09-30 ENCOUNTER — Ambulatory Visit
Admission: RE | Admit: 2019-09-30 | Discharge: 2019-09-30 | Disposition: A | Discharge status: Home / Self Care | Payer: Medicare Other | Source: Ambulatory Visit | Attending: Family Medicine | Admitting: Family Medicine

## 2019-09-30 ENCOUNTER — Other Ambulatory Visit: Payer: Self-pay

## 2019-09-30 DIAGNOSIS — Z1231 Encounter for screening mammogram for malignant neoplasm of breast: Secondary | ICD-10-CM

## 2019-11-07 ENCOUNTER — Ambulatory Visit (INDEPENDENT_AMBULATORY_CARE_PROVIDER_SITE_OTHER): Payer: Medicare Other | Admitting: Podiatry

## 2019-11-07 ENCOUNTER — Encounter: Payer: Self-pay | Admitting: Podiatry

## 2019-11-07 ENCOUNTER — Other Ambulatory Visit: Payer: Self-pay

## 2019-11-07 DIAGNOSIS — L84 Corns and callosities: Secondary | ICD-10-CM

## 2019-11-07 DIAGNOSIS — M79672 Pain in left foot: Secondary | ICD-10-CM

## 2019-11-07 DIAGNOSIS — M05772 Rheumatoid arthritis with rheumatoid factor of left ankle and foot without organ or systems involvement: Secondary | ICD-10-CM

## 2019-11-07 DIAGNOSIS — M79671 Pain in right foot: Secondary | ICD-10-CM

## 2019-11-07 DIAGNOSIS — M05771 Rheumatoid arthritis with rheumatoid factor of right ankle and foot without organ or systems involvement: Secondary | ICD-10-CM

## 2019-11-07 NOTE — Patient Instructions (Signed)

## 2019-11-12 ENCOUNTER — Ambulatory Visit: Payer: Medicare Other

## 2019-11-12 NOTE — Progress Notes (Signed)
Subjective: [name] presents today for follow up of painful callus(es) plantarly. Pt has h/o RA with chronic plantar lesions which cause difficulty ambulating..   Allergies  Allergen Reactions  . Dilaudid [Hydromorphone Hcl] Nausea And Vomiting  . Alendronate Other (See Comments)  . Morphine And Related   . Phenergan [Promethazine Hcl]   . Tamiflu [Oseltamivir Phosphate]     rash  . Taxol [Paclitaxel]     rash  . Tetanus Immune Globulin   . Tetanus Toxoids   . Tramadol Other (See Comments)  . Oseltamivir Rash     Objective: There were no vitals filed for this visit.  Vascular Examination:  Capillary refill time to digits immediate b/l, palpable DP pulses b/l, palpable PT pulses b/l, pedal hair present b/l and skin temperature gradient within normal limits b/l  Dermatological Examination: Pedal skin with normal turgor, texture and tone bilaterally, no open wounds bilaterally, no interdigital macerations bilaterally and hyperkeratotic lesion(s) submet head 2 b/l with tenderness to palpation.  No erythema, no edema, no drainage, no flocculence  Musculoskeletal: Normal muscle strength 5/5 to all lower extremity muscle groups bilaterally, no pain crepitus or joint limitation noted with ROM b/l, bunion deformity noted b/l and hammertoes noted to the left, right, 2nd toe, 3rd toe, 4th toe, 5th toe  Neurological: Protective sensation intact 5/5 intact bilaterally with 10g monofilament b/l and vibratory sensation intact b/l  Assessment: 1. Callus   2. Rheumatoid arthritis involving both feet with positive rheumatoid factor (HCC)   3. Pain in both feet     Plan: -corns and calluses were debrided without complication or incident. Total number debrided =2 submet head 2 b/l -Patient to continue soft, supportive shoe gear daily. -Patient to report any pedal injuries to medical professional immediately. -Patient/POA to call should there be question/concern in the interim.  Return  in about 9 weeks (around 01/09/2020) for callus trim.

## 2019-11-17 ENCOUNTER — Ambulatory Visit: Payer: Medicare Other | Attending: Internal Medicine

## 2019-11-17 ENCOUNTER — Ambulatory Visit: Payer: Medicare Other

## 2019-11-17 DIAGNOSIS — Z23 Encounter for immunization: Secondary | ICD-10-CM

## 2019-11-17 NOTE — Progress Notes (Signed)
   Covid-19 Vaccination Clinic  Name:  [name]    MRN: [id number] DOB: Oct 24, 1933  11/17/2019  [name] was observed post Covid-19 immunization for 15 minutes without incidence. She was provided with Vaccine Information Sheet and instruction to access the V-Safe system.   [name] was instructed to call 911 with any severe reactions post vaccine: Marland Kitchen Difficulty breathing  . Swelling of your face and throat  . A fast heartbeat  . A bad rash all over your body  . Dizziness and weakness    Immunizations Administered    Name Date Dose VIS Date Route   Pfizer COVID-19 Vaccine 11/17/2019  3:40 PM 0.3 mL 09/23/2019 Intramuscular   Manufacturer: [place]   Lot: [id number]   [place]: [id number]

## 2019-11-29 DIAGNOSIS — H903 Sensorineural hearing loss, bilateral: Secondary | ICD-10-CM | POA: Diagnosis not present

## 2019-11-29 DIAGNOSIS — H6123 Impacted cerumen, bilateral: Secondary | ICD-10-CM | POA: Diagnosis not present

## 2019-11-29 DIAGNOSIS — Z974 Presence of external hearing-aid: Secondary | ICD-10-CM | POA: Diagnosis not present

## 2019-12-08 DIAGNOSIS — M2550 Pain in unspecified joint: Secondary | ICD-10-CM | POA: Diagnosis not present

## 2019-12-08 DIAGNOSIS — Z6824 Body mass index (BMI) 24.0-24.9, adult: Secondary | ICD-10-CM | POA: Diagnosis not present

## 2019-12-08 DIAGNOSIS — Z79899 Other long term (current) drug therapy: Secondary | ICD-10-CM | POA: Diagnosis not present

## 2019-12-08 DIAGNOSIS — M0579 Rheumatoid arthritis with rheumatoid factor of multiple sites without organ or systems involvement: Secondary | ICD-10-CM | POA: Diagnosis not present

## 2019-12-13 ENCOUNTER — Ambulatory Visit: Payer: Medicare Other

## 2019-12-14 ENCOUNTER — Ambulatory Visit: Payer: Medicare Other | Attending: Internal Medicine

## 2019-12-14 DIAGNOSIS — Z23 Encounter for immunization: Secondary | ICD-10-CM | POA: Insufficient documentation

## 2019-12-14 NOTE — Progress Notes (Signed)
   Covid-19 Vaccination Clinic  Name:  [name]    MRN: [id number] DOB: 25-Sep-1934  12/14/2019  [name] was observed post Covid-19 immunization for 15 minutes without incident. She was provided with Vaccine Information Sheet and instruction to access the V-Safe system.   [name] was instructed to call 911 with any severe reactions post vaccine: Marland Kitchen Difficulty breathing  . Swelling of face and throat  . A fast heartbeat  . A bad rash all over body  . Dizziness and weakness   Immunizations Administered    Name Date Dose VIS Date Route   Pfizer COVID-19 Vaccine 12/14/2019 10:02 AM 0.3 mL 09/23/2019 Intramuscular   Manufacturer: [place]   Lot: [id number]   [place]: [id number]

## 2020-01-16 ENCOUNTER — Other Ambulatory Visit: Payer: Self-pay

## 2020-01-16 ENCOUNTER — Encounter: Payer: Self-pay | Admitting: Podiatry

## 2020-01-16 ENCOUNTER — Ambulatory Visit (INDEPENDENT_AMBULATORY_CARE_PROVIDER_SITE_OTHER): Payer: Medicare Other | Admitting: Podiatry

## 2020-01-16 DIAGNOSIS — L84 Corns and callosities: Secondary | ICD-10-CM

## 2020-01-16 DIAGNOSIS — M05772 Rheumatoid arthritis with rheumatoid factor of left ankle and foot without organ or systems involvement: Secondary | ICD-10-CM

## 2020-01-16 DIAGNOSIS — M79672 Pain in left foot: Secondary | ICD-10-CM

## 2020-01-16 DIAGNOSIS — M05771 Rheumatoid arthritis with rheumatoid factor of right ankle and foot without organ or systems involvement: Secondary | ICD-10-CM

## 2020-01-16 DIAGNOSIS — M79671 Pain in right foot: Secondary | ICD-10-CM

## 2020-01-16 NOTE — Patient Instructions (Signed)

## 2020-01-23 NOTE — Progress Notes (Signed)
Subjective: [name] presents today for follow up of painful callus(es) plantar aspect b/l feet which interfere with ambulation and ADLs. Aggravating factors include weightbearing with and without shoe gear. Pain is relieved with periodic professional debridement..   Allergies  Allergen Reactions  . Dilaudid [Hydromorphone Hcl] Nausea And Vomiting  . Alendronate Other (See Comments)  . Morphine And Related   . Phenergan [Promethazine Hcl]   . Tamiflu [Oseltamivir Phosphate]     rash  . Taxol [Paclitaxel]     rash  . Tetanus Immune Globulin   . Tetanus Toxoids   . Tramadol Other (See Comments)  . Oseltamivir Rash     Objective: There were no vitals filed for this visit.  Pt 86 y.o. year old female  in NAD. AAO x 3.   Vascular Examination:  Capillary refill time to digits immediate b/l. Palpable DP pulses b/l. Palpable PT pulses b/l. Pedal hair present b/l. Skin temperature gradient within normal limits b/l.  Dermatological Examination: Pedal skin with normal turgor, texture and tone bilaterally. No open wounds bilaterally. No interdigital macerations bilaterally. Toenails 1-5 b/l elongated, dystrophic, thickened, crumbly with subungual debris and tenderness to dorsal palpation. Hyperkeratotic lesion(s) submet head 2 left foot and submet head 2 right foot.  No erythema, no edema, no drainage, no flocculence.  Musculoskeletal: Normal muscle strength 5/5 to all lower extremity muscle groups bilaterally, no pain crepitus or joint limitation noted with ROM b/l, bunion deformity noted b/l and hammertoes noted to the  2-5 bilaterally  Neurological: Protective sensation intact 5/5 intact bilaterally with 10g monofilament b/l Vibratory sensation intact b/l Achilles reflex 2+ b/l Clonus negative b/l  Assessment: 1. Callus   2. Rheumatoid arthritis involving both feet with positive rheumatoid factor (HCC)   3. Pain in both feet    Plan: -Callus(es) submet head 2 left foot and  submet head 2 right foot were debrided without complication or incident. Total number debrided =2. -Patient to continue soft, supportive shoe gear daily. -Patient to report any pedal injuries to medical professional immediately. -Patient/POA to call should there be question/concern in the interim.  Return in about 9 weeks (around 03/19/2020) for callus trim.

## 2020-02-01 DIAGNOSIS — L821 Other seborrheic keratosis: Secondary | ICD-10-CM | POA: Diagnosis not present

## 2020-02-01 DIAGNOSIS — L814 Other melanin hyperpigmentation: Secondary | ICD-10-CM | POA: Diagnosis not present

## 2020-02-01 DIAGNOSIS — Z85828 Personal history of other malignant neoplasm of skin: Secondary | ICD-10-CM | POA: Diagnosis not present

## 2020-02-01 DIAGNOSIS — L570 Actinic keratosis: Secondary | ICD-10-CM | POA: Diagnosis not present

## 2020-02-01 DIAGNOSIS — D1801 Hemangioma of skin and subcutaneous tissue: Secondary | ICD-10-CM | POA: Diagnosis not present

## 2020-02-01 DIAGNOSIS — L918 Other hypertrophic disorders of the skin: Secondary | ICD-10-CM | POA: Diagnosis not present

## 2020-02-13 DIAGNOSIS — K1370 Unspecified lesions of oral mucosa: Secondary | ICD-10-CM | POA: Diagnosis not present

## 2020-02-17 DIAGNOSIS — L218 Other seborrheic dermatitis: Secondary | ICD-10-CM | POA: Diagnosis not present

## 2020-02-17 DIAGNOSIS — Z85828 Personal history of other malignant neoplasm of skin: Secondary | ICD-10-CM | POA: Diagnosis not present

## 2020-02-17 DIAGNOSIS — L738 Other specified follicular disorders: Secondary | ICD-10-CM | POA: Diagnosis not present

## 2020-02-17 DIAGNOSIS — L739 Follicular disorder, unspecified: Secondary | ICD-10-CM | POA: Diagnosis not present

## 2020-02-28 ENCOUNTER — Encounter: Payer: Self-pay | Admitting: Podiatry

## 2020-02-28 ENCOUNTER — Other Ambulatory Visit: Payer: Self-pay

## 2020-02-28 ENCOUNTER — Ambulatory Visit (INDEPENDENT_AMBULATORY_CARE_PROVIDER_SITE_OTHER): Payer: Medicare Other | Admitting: Podiatry

## 2020-02-28 DIAGNOSIS — L97521 Non-pressure chronic ulcer of other part of left foot limited to breakdown of skin: Secondary | ICD-10-CM

## 2020-02-28 MED ORDER — MUPIROCIN 2 % EX OINT: TOPICAL_OINTMENT | CUTANEOUS | 0 refills | Status: AC

## 2020-02-28 NOTE — Patient Instructions (Addendum)
DRESSING CHANGES LEFT FOOT    1. KEEP LEFT  FOOT DRY AT ALL TIMES!!!!  2. CLEANSE ULCER WITH SALINE.  3. DAB DRY WITH GAUZE SPONGE.  4. APPLY A LIGHT AMOUNT OF MUPIROCIN TO BASE OF ULCER.  5. APPLY OUTER DRESSING/BAND-AID AS INSTRUCTED.  Marland Kitchen  7. DO NOT WALK BAREFOOT!!!  8.  IF YOU EXPERIENCE ANY FEVER, CHILLS, NIGHTSWEATS, NAUSEA OR VOMITING, ELEVATED OR LOW BLOOD SUGARS, REPORT TO EMERGENCY ROOM.  9. IF YOU EXPERIENCE INCREASED REDNESS, PAIN, SWELLING, DISCOLORATION, ODOR, PUS, DRAINAGE OR WARMTH OF YOUR FOOT, REPORT TO EMERGENCY ROOM.  Corns and Calluses Corns are small areas of thickened skin that occur on the top, sides, or tip of a toe. They contain a cone-shaped core with a point that can press on a nerve below. This causes pain.  Calluses are areas of thickened skin that can occur anywhere on the body, including the hands, fingers, palms, soles of the feet, and heels. Calluses are usually larger than corns. What are the causes? Corns and calluses are caused by rubbing (friction) or pressure, such as from shoes that are too tight or do not fit properly. What increases the risk? Corns are more likely to develop in people who have misshapen toes (toe deformities), such as hammer toes. Calluses can occur with friction to any area of the skin. They are more likely to develop in people who:  Work with their hands.  Wear shoes that fit poorly, are too tight, or are high-heeled.  Have toe deformities. What are the signs or symptoms? Symptoms of a corn or callus include:  A hard growth on the skin.  Pain or tenderness under the skin.  Redness and swelling.  Increased discomfort while wearing tight-fitting shoes, if your feet are affected. If a corn or callus becomes infected, symptoms may include:  Redness and swelling that gets worse.  Pain.  Fluid, blood, or pus draining from the corn or callus. How is this diagnosed? Corns and calluses may be diagnosed based on  your symptoms, your medical history, and a physical exam. How is this treated? Treatment for corns and calluses may include:  Removing the cause of the friction or pressure. This may involve: ? Changing your shoes. ? Wearing shoe inserts (orthotics) or other protective layers in your shoes, such as a corn pad. ? Wearing gloves.  Applying medicine to the skin (topical medicine) to help soften skin in the hardened, thickened areas.  Removing layers of dead skin with a file to reduce the size of the corn or callus.  Removing the corn or callus with a scalpel or laser.  Taking antibiotic medicines, if your corn or callus is infected.  Having surgery, if a toe deformity is the cause. Follow these instructions at home:   Take over-the-counter and prescription medicines only as told by your health care provider.  If you were prescribed an antibiotic, take it as told by your health care provider. Do not stop taking it even if your condition starts to improve.  Wear shoes that fit well. Avoid wearing high-heeled shoes and shoes that are too tight or too loose.  Wear any padding, protective layers, gloves, or orthotics as told by your health care provider.  Soak your hands or feet and then use a file or pumice stone to soften your corn or callus. Do this as told by your health care provider.  Check your corn or callus every day for symptoms of infection. Contact a health care provider if  you:  Notice that your symptoms do not improve with treatment.  Have redness or swelling that gets worse.  Notice that your corn or callus becomes painful.  Have fluid, blood, or pus coming from your corn or callus.  Have new symptoms. Summary  Corns are small areas of thickened skin that occur on the top, sides, or tip of a toe.  Calluses are areas of thickened skin that can occur anywhere on the body, including the hands, fingers, palms, and soles of the feet. Calluses are usually larger than  corns.  Corns and calluses are caused by rubbing (friction) or pressure, such as from shoes that are too tight or do not fit properly.  Treatment may include wearing any padding, protective layers, gloves, or orthotics as told by your health care provider. This information is not intended to replace advice given to you by your health care provider. Make sure you discuss any questions you have with your health care provider. Document Revised: 01/19/2019 Document Reviewed: 08/12/2017 Elsevier Patient Education  2020 [employer].

## 2020-03-05 NOTE — Progress Notes (Signed)
Subjective: [name] is a 86 y.o. female patient seen today painful callus(es) b/l feet left>right. Aggravating factors include weightbearing with and without shoe gear. Pain is relieved with periodic professional debridement. She relates left foot is more painful today. Denies any drainage or redness. Denies any fever, chills, night sweats, nausea or vomiting.  Patient Active Problem List   Diagnosis Date Noted  . Hammer toe 12/24/2018  . Metatarsalgia of left foot 12/24/2018  . Degeneration of lumbar intervertebral disc 08/10/2018  . Bilateral impacted cerumen 08/07/2017  . Sensorineural hearing loss (SNHL) of both ears 08/07/2017  . Dyspnea on exertion 12/30/2013  . Chest pain on exertion 12/30/2013  . HTN (hypertension) 12/30/2013  . Hyperlipidemia 12/30/2013  . Rheumatoid arthritis ([place])   . Hypothyroidism   . Breast cancer [facility])     Current Outpatient Medications on File Prior to Visit  Medication Sig Dispense Refill  . acetaminophen-codeine (TYLENOL #3) 300-30 MG per tablet Take 1 tablet by mouth every 4 (four) hours as needed for moderate pain.     Marland Kitchen ALPRAZolam (XANAX) 0.25 MG tablet Take 0.125 mg by mouth daily as needed for anxiety.     Marland Kitchen amLODipine (NORVASC) 2.5 MG tablet     . betamethasone dipropionate (DIPROLENE) 0.05 % cream     . Calcium Citrate-Vitamin D (CITRACAL + D PO) Take 1 tablet by mouth daily.    . cephALEXin (KEFLEX) 500 MG capsule Take 1 capsule (500 mg total) by mouth 3 (three) times daily. 30 capsule 0  . cholecalciferol (VITAMIN D) 1000 UNITS tablet Take 1,000 Units by mouth daily.    . cyclobenzaprine (FLEXERIL) 10 MG tablet TK 1/2 TO 1 T PO Q 8 H PRF MUSCLE SPASM    . denosumab (PROLIA) 60 MG/ML SOLN injection Inject 60 mg into the skin every 6 (six) months. Administer in upper arm, thigh, or abdomen    . dicyclomine (BENTYL) 10 MG capsule Take 10 mg by mouth daily as needed for spasms.     Marland Kitchen doxycycline (VIBRAMYCIN) 100 MG capsule Take 100 mg by  mouth 2 (two) times daily.    . fish oil-omega-3 fatty acids 1000 MG capsule Take 1,200 mg by mouth daily.     . folic acid (FOLVITE) 1 MG tablet Take 2 mg by mouth daily.     . hydrocortisone 2.5 % cream hydrocortisone 2.5 % topical cream    . levothyroxine (SYNTHROID, LEVOTHROID) 150 MCG tablet Take 150 mcg by mouth daily before breakfast.    . loratadine (CLARITIN) 10 MG tablet Take by mouth.    . Melatonin 2.5 MG CAPS Take 2.5 mg by mouth at bedtime as needed.     . methotrexate (50 MG/ML) 1 G injection Inject 0.8 mg into the vein once a week. Sunday    . Multiple Vitamin (MULTIVITAMIN) capsule Take 1 capsule by mouth daily.    . naproxen (NAPRELAN) 500 MG 24 hr tablet Take 500 mg by mouth daily with breakfast.     . naproxen (NAPROSYN) 500 MG tablet naproxen 500 mg tablet    . neomycin-polymyxin-dexameth (MAXITROL) 0.1 % OINT neomycin 3.5 mg/g-polymyxin B 10,000 unit/g-dexameth 0.1 % eye oint  APPLY TO LIDS TWICE A DAY FOR 10 DAYS    . Polyethylene Glycol 3350 (MIRALAX PO) Take by mouth See admin instructions. 3/4 a capful once daily    . potassium chloride (K-DUR) 10 MEQ tablet Take by mouth.    . potassium chloride SA (K-DUR,KLOR-CON) 20 MEQ tablet Take 20  mEq by mouth daily.    . predniSONE (DELTASONE) 5 MG tablet prednisone 5 mg tablets in a dose pack  Take 1 dose pk by oral route as directed for 6 days.    . ranitidine (ZANTAC) 75 MG tablet Take 75 mg by mouth as needed for heartburn.    . simvastatin (ZOCOR) 20 MG tablet Take 20 mg by mouth every evening.    . simvastatin (ZOCOR) 40 MG tablet     . triamterene-hydrochlorothiazide (MAXZIDE-25) 37.5-25 MG per tablet Take 1 tablet by mouth daily.    . vitamin B-12 (CYANOCOBALAMIN) 500 MCG tablet Take by mouth.     No current facility-administered medications on file prior to visit.    Allergies  Allergen Reactions  . Dilaudid [Hydromorphone Hcl] Nausea And Vomiting  . Alendronate Other (See Comments)  . Morphine And Related    . Phenergan [Promethazine Hcl]   . Tamiflu [Oseltamivir Phosphate]     rash  . Taxol [Paclitaxel]     rash  . Tetanus Immune Globulin   . Tetanus Toxoids   . Tramadol Other (See Comments)  . Oseltamivir Rash    Objective: Physical Exam  General: 86 y.o. pleasant Caucasian female in no acute distress. AAO x 3.   Neurovascular status unchanged b/l.  Capillary refill time to digits immediate b/l. Palpable DP pulses b/l. Palpable PT pulses b/l. Pedal hair present b/l. Skin temperature gradient within normal limits b/l.  Protective sensation intact 5/5 intact bilaterally with 10g monofilament b/l. Vibratory sensation intact b/l.  Dermatological:  Pedal skin with normal turgor, texture and tone bilaterally. No open wounds bilaterally. No interdigital macerations bilaterally. No images are attached to the encounter.  Wound Location: submet head 2 left foot There is a minimal amount of non-viable hyperkeratotic tissue present in the wound. Predebridement Wound Measurement: 3.0 x 1.5 cm. Postdebridement Wound Measurement: 3.1 x 1.5 x 0.1 cm. Wound Base: Granular/Healthy Peri-wound: Normal Exudate: None: wound tissue dry Blood Loss during debridement: less than 0 cc's. Description of tissue removed from ulceration today: non-viable hyperkeratosis. Signs of clinical bacterial infection are absent. Material in wound which inhibits healing/promotes adjacent tissue breakdown:  Non-viable hyperkeratosis.  Musculoskeletal:  Normal muscle strength 5/5 to all lower extremity muscle groups bilaterally. No pain crepitus or joint limitation noted with ROM b/l. Hallux valgus with bunion deformity noted b/l. Hammertoes noted to the 2-5 bilaterally.  Assessment and Plan:  1. Skin ulcer of left foot, limited to breakdown of skin ([place])    -Patient was evaluated and treated and all questions answered.  -Patient/POA/Family member educated on diagnosis and treatment plan of routine ulcer  debridement/wound care.  -Ulcer debrided utilizing: sterile scalpel blade. -Amount of devitalized tissue removed: Minimal amount of hyperkeratosis -Today's ulcer size post-debridement: 3.1 x 1.5 x 0.1 cm -Wound responded well to today's debridement. -Dressing applied consisting of triple antibiotic ointment and band-aid -Patient risk factors affecting healing of ulcer: foot deformity, RA, immunosuppressive medications -Frequency of debridement needed to achieve healing: every 2 weeks -Patient to report any pedal injuries to medical professional immediately. -Written instructions for daily wound care dispensed to Mrs. Ostdiek. -Prescription written for Mupirocin Ointment. Patient is to apply to ulcer plantar left foot once daily and cover with dressing.  -Patient/POA to call should there be question/concern in the interim. Return in about 2 weeks (around 03/13/2020) for foot ulcer left .  Marzetta Board, DPM

## 2020-03-06 DIAGNOSIS — Z79899 Other long term (current) drug therapy: Secondary | ICD-10-CM | POA: Diagnosis not present

## 2020-03-06 DIAGNOSIS — M2550 Pain in unspecified joint: Secondary | ICD-10-CM | POA: Diagnosis not present

## 2020-03-06 DIAGNOSIS — Z6824 Body mass index (BMI) 24.0-24.9, adult: Secondary | ICD-10-CM | POA: Diagnosis not present

## 2020-03-06 DIAGNOSIS — M0579 Rheumatoid arthritis with rheumatoid factor of multiple sites without organ or systems involvement: Secondary | ICD-10-CM | POA: Diagnosis not present

## 2020-03-06 DIAGNOSIS — M150 Primary generalized (osteo)arthritis: Secondary | ICD-10-CM | POA: Diagnosis not present

## 2020-03-09 ENCOUNTER — Ambulatory Visit (INDEPENDENT_AMBULATORY_CARE_PROVIDER_SITE_OTHER): Payer: Medicare Other | Admitting: Podiatry

## 2020-03-09 ENCOUNTER — Other Ambulatory Visit: Payer: Self-pay

## 2020-03-09 DIAGNOSIS — R262 Difficulty in walking, not elsewhere classified: Secondary | ICD-10-CM

## 2020-03-09 DIAGNOSIS — Z872 Personal history of diseases of the skin and subcutaneous tissue: Secondary | ICD-10-CM

## 2020-03-09 DIAGNOSIS — M05772 Rheumatoid arthritis with rheumatoid factor of left ankle and foot without organ or systems involvement: Secondary | ICD-10-CM

## 2020-03-09 DIAGNOSIS — M05771 Rheumatoid arthritis with rheumatoid factor of right ankle and foot without organ or systems involvement: Secondary | ICD-10-CM | POA: Diagnosis not present

## 2020-03-13 ENCOUNTER — Ambulatory Visit: Payer: Medicare Other | Admitting: Sports Medicine

## 2020-03-16 ENCOUNTER — Encounter: Payer: Self-pay | Admitting: Podiatry

## 2020-03-16 NOTE — Progress Notes (Signed)
Subjective:  Patient ID: [name], female    DOB: 29-Sep-1934,  MRN: [id number]  86 y.o. female is seen for follow up ulcer submet head 2 left foot.    Wound Care regimen: daily Mupirocin Ointment dressing changes.  Hx confirmed with [name].  Patient feels ulcer feels/looks better today.  Patient denies fever, chills, night sweats, nausea, or vomiting.  Past Medical History:  Diagnosis Date  . Abnormal cardiovascular stress test 12/2013   s/p cath with mild nonobstructive CAD normal EF - managed medically  . Breast cancer ([place]) 2008   RT, chemo, lumpectomy  . Diverticulitis   . DVT (deep vein thrombosis) in pregnancy    site of PICC catheter  . HTN (hypertension)   . Hyperlipidemia   . Hypothyroidism   . Kidney tumor 2010   RFA  . Personal history of chemotherapy   . Personal history of radiation therapy   . Prediabetes   . Rheumatoid arthritis(714.0)   . UTI (lower urinary tract infection)      Past Surgical History:  Procedure Laterality Date  . ABDOMINAL SURGERY     for ruptured diverticuli  . bilateral foot surgery    . BREAST LUMPECTOMY Left   . CARDIAC CATHETERIZATION    . CERVICAL SPINE SURGERY    . LEFT HEART CATHETERIZATION WITH CORONARY ANGIOGRAM N/A 01/17/2014   Procedure: LEFT HEART CATHETERIZATION WITH CORONARY ANGIOGRAM;  Surgeon: Peter M Martinique, MD;  Location: [facility] CATH LAB;  Service: Cardiovascular;  Laterality: N/A;  . OVARIAN CYST REMOVAL    . perforation of colon    . renal RFA    . take down of colonostomy  2011  . TONSILLECTOMY       Current Outpatient Medications on File Prior to Visit  Medication Sig Dispense Refill  . acetaminophen-codeine (TYLENOL #3) 300-30 MG per tablet Take 1 tablet by mouth every 4 (four) hours as needed for moderate pain.     Marland Kitchen ALPRAZolam (XANAX) 0.25 MG tablet Take 0.125 mg by mouth daily as needed for anxiety.     Marland Kitchen amLODipine (NORVASC) 2.5 MG tablet     . betamethasone dipropionate (DIPROLENE) 0.05 % cream      . Calcium Citrate-Vitamin D (CITRACAL + D PO) Take 1 tablet by mouth daily.    . cephALEXin (KEFLEX) 500 MG capsule Take 1 capsule (500 mg total) by mouth 3 (three) times daily. 30 capsule 0  . cholecalciferol (VITAMIN D) 1000 UNITS tablet Take 1,000 Units by mouth daily.    . cyclobenzaprine (FLEXERIL) 10 MG tablet TK 1/2 TO 1 T PO Q 8 H PRF MUSCLE SPASM    . denosumab (PROLIA) 60 MG/ML SOLN injection Inject 60 mg into the skin every 6 (six) months. Administer in upper arm, thigh, or abdomen    . dicyclomine (BENTYL) 10 MG capsule Take 10 mg by mouth daily as needed for spasms.     Marland Kitchen doxycycline (VIBRAMYCIN) 100 MG capsule Take 100 mg by mouth 2 (two) times daily.    . fish oil-omega-3 fatty acids 1000 MG capsule Take 1,200 mg by mouth daily.     . folic acid (FOLVITE) 1 MG tablet Take 2 mg by mouth daily.     . hydrocortisone 2.5 % cream hydrocortisone 2.5 % topical cream    . levothyroxine (SYNTHROID, LEVOTHROID) 150 MCG tablet Take 150 mcg by mouth daily before breakfast.    . loratadine (CLARITIN) 10 MG tablet Take by mouth.    . Melatonin 2.5  MG CAPS Take 2.5 mg by mouth at bedtime as needed.     . methotrexate (50 MG/ML) 1 G injection Inject 0.8 mg into the vein once a week. Sunday    . Multiple Vitamin (MULTIVITAMIN) capsule Take 1 capsule by mouth daily.    . mupirocin ointment (BACTROBAN) 2 % Apply to left foot once daily. 22 g 0  . naproxen (NAPRELAN) 500 MG 24 hr tablet Take 500 mg by mouth daily with breakfast.     . naproxen (NAPROSYN) 500 MG tablet naproxen 500 mg tablet    . neomycin-polymyxin-dexameth (MAXITROL) 0.1 % OINT neomycin 3.5 mg/g-polymyxin B 10,000 unit/g-dexameth 0.1 % eye oint  APPLY TO LIDS TWICE A DAY FOR 10 DAYS    . Polyethylene Glycol 3350 (MIRALAX PO) Take by mouth See admin instructions. 3/4 a capful once daily    . potassium chloride (K-DUR) 10 MEQ tablet Take by mouth.    . potassium chloride SA (K-DUR,KLOR-CON) 20 MEQ tablet Take 20 mEq by mouth  daily.    . predniSONE (DELTASONE) 5 MG tablet prednisone 5 mg tablets in a dose pack  Take 1 dose pk by oral route as directed for 6 days.    . ranitidine (ZANTAC) 75 MG tablet Take 75 mg by mouth as needed for heartburn.    . simvastatin (ZOCOR) 20 MG tablet Take 20 mg by mouth every evening.    . simvastatin (ZOCOR) 40 MG tablet     . triamterene-hydrochlorothiazide (MAXZIDE-25) 37.5-25 MG per tablet Take 1 tablet by mouth daily.    . vitamin B-12 (CYANOCOBALAMIN) 500 MCG tablet Take by mouth.     No current facility-administered medications on file prior to visit.     Allergies  Allergen Reactions  . Dilaudid [Hydromorphone Hcl] Nausea And Vomiting  . Alendronate Other (See Comments)  . Morphine And Related   . Phenergan [Promethazine Hcl]   . Tamiflu [Oseltamivir Phosphate]     rash  . Taxol [Paclitaxel]     rash  . Tetanus Immune Globulin   . Tetanus Toxoids   . Tramadol Other (See Comments)  . Oseltamivir Rash     Family History  Problem Relation Age of Onset  . Heart disease Mother   . Cancer Mother        stomach  . Breast cancer Mother   . Heart disease Father   . Breast cancer Daughter      Social History   Occupational History  . Not on file  Tobacco Use  . Smoking status: Never Smoker  . Smokeless tobacco: Never Used  Substance and Sexual Activity  . Alcohol use: No  . Drug use: Not on file  . Sexual activity: Not on file     Immunization History  Administered Date(s) Administered  . Influenza Split 07/20/2013, 06/27/2014  . PFIZER SARS-COV-2 Vaccination 11/17/2019, 12/14/2019  . Pneumococcal Polysaccharide-23 02/18/2007     Review of systems: wound plantar left foot  Objective:  Physical Exam: There were no vitals filed for this visit.   86 y.o. Caucasian female WD, WN in NAD. AAO x 3.  Capillary refill time to digits immediate b/l. Palpable DP pulses b/l. Palpable PT pulses b/l. Pedal hair present b/l. Skin temperature gradient within  normal limits b/l. No edema noted b/l.  Pedal skin with normal turgor, texture and tone bilaterally. No open wounds bilaterally. No interdigital macerations bilaterally.  No images are attached to the encounter.  Wound Location: submet head 2 left foot There is  minimal hyperkeratosis present in the wound. Predebridement Wound Measurement: 3.1 x 1.5 x 0.1 cm  Postdebridement Wound Measurement: 3.1 x 1.5 cm Wound Base: epithelialized with visible subdermal hemorrhage Peri-wound: Normal Exudate: None: wound tissue dry Blood Loss during debridement: 0 cc's. Description of tissue removed from ulceration today:  hyperkeratosis. Signs of clinical bacterial infection are absent. Material in wound which inhibits healing/promotes adjacent tissue breakdown: hyperkeratosis  Normal muscle strength 5/5 to all lower extremity muscle groups bilaterally. No pain crepitus or joint limitation noted with ROM b/l. Hallux valgus with bunion deformity noted b/l lower extremities. Hammertoes noted to the 2-5 bilaterally.  Protective sensation intact 5/5 intact bilaterally with 10g monofilament b/l. Vibratory sensation intact b/l. Proprioception intact bilaterally.  Assessment:   1. Healed foot ulcer   2. Rheumatoid arthritis involving both feet with positive rheumatoid factor (HCC)   3. Walking difficulty due to ankle and foot    Plan:  -Patient was evaluated and treated and all questions answered.  -Applied and dispensed horseshoe pads for offloading. She is aware there is a reusable horseshoe pad available to purchase. She will look on [employer] for nonmedicated felt horshoe pads. -Patient/POA/Family member educated on diagnosis and treatment plan of routine ulcer debridement/wound care.  -Type/amount of devitalized tissue removed: hyperkeratosis -Today's ulcer size post-debridement: 3.1 x 1.5 cm; ulcer is healed -Wound responded well to today's debridement. -Patient risk factors affecting healing of  ulcer: -Frequency of debridement needed to achieve healing: foot deformity, chronic immunosuppresion due to RA therapy -Patient to continue soft, supportive shoe gear daily. -Patient to report any pedal injuries to medical professional immediately. -Patient/POA to call should there be question/concern in the interim.  Return in about 4 weeks (around 04/06/2020) for follow up preulcerative callus.  Marzetta Board, DPM

## 2020-03-19 DIAGNOSIS — E039 Hypothyroidism, unspecified: Secondary | ICD-10-CM | POA: Diagnosis not present

## 2020-03-19 DIAGNOSIS — I10 Essential (primary) hypertension: Secondary | ICD-10-CM | POA: Diagnosis not present

## 2020-03-19 DIAGNOSIS — F419 Anxiety disorder, unspecified: Secondary | ICD-10-CM | POA: Diagnosis not present

## 2020-03-19 DIAGNOSIS — R7303 Prediabetes: Secondary | ICD-10-CM | POA: Diagnosis not present

## 2020-03-19 DIAGNOSIS — F330 Major depressive disorder, recurrent, mild: Secondary | ICD-10-CM | POA: Diagnosis not present

## 2020-04-04 ENCOUNTER — Ambulatory Visit: Payer: Medicare Other | Admitting: Podiatry

## 2020-04-04 ENCOUNTER — Other Ambulatory Visit: Payer: Self-pay

## 2020-04-04 ENCOUNTER — Ambulatory Visit (INDEPENDENT_AMBULATORY_CARE_PROVIDER_SITE_OTHER): Payer: Medicare Other | Admitting: Podiatry

## 2020-04-04 ENCOUNTER — Encounter: Payer: Self-pay | Admitting: Podiatry

## 2020-04-04 DIAGNOSIS — M05771 Rheumatoid arthritis with rheumatoid factor of right ankle and foot without organ or systems involvement: Secondary | ICD-10-CM | POA: Diagnosis not present

## 2020-04-04 DIAGNOSIS — M05772 Rheumatoid arthritis with rheumatoid factor of left ankle and foot without organ or systems involvement: Secondary | ICD-10-CM | POA: Diagnosis not present

## 2020-04-04 DIAGNOSIS — Z872 Personal history of diseases of the skin and subcutaneous tissue: Secondary | ICD-10-CM | POA: Diagnosis not present

## 2020-04-04 NOTE — Patient Instructions (Signed)

## 2020-04-09 NOTE — Progress Notes (Signed)
Subjective:  Patient ID: [name], female    DOB: 22-Apr-1934,  MRN: [id number]  86 y.o. female is seen for follow up ulcer check of submet head 2 left foot.  Prescribed wound care regimen: Mupirocin Ointment dressing once daily.  Offloading achieved with felt horseshoe padding daily.  Patient relates wound is better than previous visit.  Patient denies any fever, chills, night sweats, nausea, or vomiting.  Past Medical History:  Diagnosis Date   Abnormal cardiovascular stress test 12/2013   s/p cath with mild nonobstructive CAD normal EF - managed medically   Breast cancer ([place]) 2008   RT, chemo, lumpectomy   Diverticulitis    DVT (deep vein thrombosis) in pregnancy    site of PICC catheter   HTN (hypertension)    Hyperlipidemia    Hypothyroidism    Kidney tumor 2010   RFA   Personal history of chemotherapy    Personal history of radiation therapy    Prediabetes    Rheumatoid arthritis(714.0)    UTI (lower urinary tract infection)      Past Surgical History:  Procedure Laterality Date   ABDOMINAL SURGERY     for ruptured diverticuli   bilateral foot surgery     BREAST LUMPECTOMY Left    CARDIAC CATHETERIZATION     CERVICAL SPINE SURGERY     LEFT HEART CATHETERIZATION WITH CORONARY ANGIOGRAM N/A 01/17/2014   Procedure: LEFT HEART CATHETERIZATION WITH CORONARY ANGIOGRAM;  Surgeon: Peter M Martinique, MD;  [facility] C[facility] LAB;  Service: Cardiovascular;  Laterality: N/A;   OVARIAN CYST REMOVAL     perforation of colon     renal RFA     take down of colonostomy  2011   TONSILLECTOMY       Current Outpatient Medications on File Prior to Visit  Medication Sig Dispense Refill   acetaminophen-codeine (TYLENOL #3) 300-30 MG per tablet Take 1 tablet by mouth every 4 (four) hours as needed for moderate pain.      ALPRAZolam (XANAX) 0.25 MG tablet Take 0.125 mg by mouth daily as needed for anxiety.      amLODipine (NORVASC) 2.5 MG tablet       betamethasone dipropionate (DIPROLENE) 0.05 % cream      Calcium Citrate-Vitamin D (CITRACAL + D PO) Take 1 tablet by mouth daily.     cephALEXin (KEFLEX) 500 MG capsule Take 1 capsule (500 mg total) by mouth 3 (three) times daily. 30 capsule 0   cholecalciferol (VITAMIN D) 1000 UNITS tablet Take 1,000 Units by mouth daily.     cyclobenzaprine (FLEXERIL) 10 MG tablet TK 1/2 TO 1 T PO Q 8 H PRF MUSCLE SPASM     denosumab (PROLIA) 60 MG/ML SOLN injection Inject 60 mg into the skin every 6 (six) months. Administer in upper arm, thigh, or abdomen     dicyclomine (BENTYL) 10 MG capsule Take 10 mg by mouth daily as needed for spasms.      doxycycline (VIBRAMYCIN) 100 MG capsule Take 100 mg by mouth 2 (two) times daily.     fish oil-omega-3 fatty acids 1000 MG capsule Take 1,200 mg by mouth daily.      folic acid (FOLVITE) 1 MG tablet Take 2 mg by mouth daily.      hydrocortisone 2.5 % cream hydrocortisone 2.5 % topical cream     levothyroxine (SYNTHROID, LEVOTHROID) 150 MCG tablet Take 150 mcg by mouth daily before breakfast.     loratadine (CLARITIN) 10 MG tablet Take by mouth.  Melatonin 2.5 MG CAPS Take 2.5 mg by mouth at bedtime as needed.      methotrexate (50 MG/ML) 1 G injection Inject 0.8 mg into the vein once a week. Sunday     Multiple Vitamin (MULTIVITAMIN) capsule Take 1 capsule by mouth daily.     mupirocin ointment (BACTROBAN) 2 % Apply to left foot once daily. 22 g 0   naproxen (NAPRELAN) 500 MG 24 hr tablet Take 500 mg by mouth daily with breakfast.      naproxen (NAPROSYN) 500 MG tablet naproxen 500 mg tablet     neomycin-polymyxin-dexameth (MAXITROL) 0.1 % OINT neomycin 3.5 mg/g-polymyxin B 10,000 unit/g-dexameth 0.1 % eye oint  APPLY TO LIDS TWICE A DAY FOR 10 DAYS     Polyethylene Glycol 3350 (MIRALAX PO) Take by mouth See admin instructions. 3/4 a capful once daily     potassium chloride (K-DUR) 10 MEQ tablet Take by mouth.     potassium chloride SA  (K-DUR,KLOR-CON) 20 MEQ tablet Take 20 mEq by mouth daily.     predniSONE (DELTASONE) 5 MG tablet prednisone 5 mg tablets in a dose pack  Take 1 dose pk by oral route as directed for 6 days.     ranitidine (ZANTAC) 75 MG tablet Take 75 mg by mouth as needed for heartburn.     simvastatin (ZOCOR) 20 MG tablet Take 20 mg by mouth every evening.     simvastatin (ZOCOR) 40 MG tablet      triamterene-hydrochlorothiazide (MAXZIDE-25) 37.5-25 MG per tablet Take 1 tablet by mouth daily.     vitamin B-12 (CYANOCOBALAMIN) 500 MCG tablet Take by mouth.     No current facility-administered medications on file prior to visit.     Allergies  Allergen Reactions   Dilaudid [Hydromorphone Hcl] Nausea And Vomiting   Alendronate Other (See Comments)   Morphine And Related    Phenergan [Promethazine Hcl]    Tamiflu [Oseltamivir Phosphate]     rash   Taxol [Paclitaxel]     rash   Tetanus Immune Globulin    Tetanus Toxoids    Tramadol Other (See Comments)   Oseltamivir Rash     Family History  Problem Relation Age of Onset   Heart disease Mother    Cancer Mother        stomach   Breast cancer Mother    Heart disease Father    Breast cancer Daughter      Social History   Occupational History   Not on file  Tobacco Use   Smoking status: Never Smoker   Smokeless tobacco: Never Used  Substance and Sexual Activity   Alcohol use: No   Drug use: Not on file   Sexual activity: Not on file     Immunization History  Administered Date(s) Administered   Influenza Split 07/20/2013, 06/27/2014   PFIZER SARS-COV-2 Vaccination 11/17/2019, 12/14/2019   Pneumococcal Polysaccharide-23 02/18/2007    Objective:  Physical Exam: There were no vitals filed for this visit.   [name] is a pleasant 86 y.o. female WD, WN in NAD. AAO x 3.  Vascular Examination: Neurovascular status unchanged b/l lower extremities. Capillary refill time to digits immediate b/l.  Palpable pedal pulses b/l LE. Pedal hair present. Lower extremity skin temperature gradient within normal limits. No pain with calf compression b/l. No edema noted b/l lower extremities.  Dermatological Examination: Pedal skin with normal turgor, texture and tone bilaterally. No open wounds bilaterally. No interdigital macerations bilaterally. Toenails 1-5 b/l well maintained  with adequate length. No erythema, no edema, no drainage, no flocculence.  No images are attached to the encounter.  Wound Location: submet head 2 left foot There is a minimal amount of devitalized tissue present in the wound with visible subdermal hemorrhage. Predebridement Wound Measurement:  3.1  x 1.5  x 0 cm. Postdebridement Wound Measurement: 3.1 x 1.5 x 0 cm. Wound Base: epithelial Peri-wound: Normal Exudate: None: wound tissue dry Blood Loss during debridement: 0 cc('s). Description of tissue removed from ulceration today: devitalized hyperkeratotic. Sign(s) of clinical bacterial infection: no clinical signs of infection noted on examination today. Material in wound which inhibits healing/promotes adjacent tissue breakdown: devitalized hyperkeratosis.  Musculoskeletal Examination: Normal muscle strength 5/5 to all lower extremity muscle groups bilaterally. No pain crepitus or joint limitation noted with ROM b/l. Hallux valgus with bunion deformity noted b/l lower extremities. Hammertoes noted to the 2-5 bilaterally. Neurological Examination: Protective sensation intact 5/5 intact bilaterally with 10g monofilament b/l. Vibratory sensation intact b/l. Proprioception intact bilaterally. Assessment:   1. Healed foot ulcer   2. Rheumatoid arthritis involving both feet with positive rheumatoid factor (HCC)    Plan:  -Patient was evaluated and treated and all questions answered.  -Patient/POA/Family member educated on diagnosis and treatment plan of routine ulcer debridement/wound care.  -Ulceration debridement  achieved utilizing sharp debridement with sterile scalpel blade.. Type/amount of devitalized tissue removed:devitalized hyperkeratosis.  -Today's ulcer size post-debridement: 3.1 x 1.5 x 0 cm. -Ulceration cleansed with wound cleanser. triple antibiotic ointment applied to base of ulceration and secured with light dressing. -Wound responded well to today's debridement. -Patient risk factors affecting healing of ulcer: foot deformity, immunosuppresive medications -Frequency of debridement needed to achieve healing: monthly -Patient to report any pedal injuries to medical professional immediately. -Patient to continue soft, supportive shoe gear daily. -Patient/POA to call should there be question/concern in the interim.  Return in about 5 weeks (around 05/09/2020) for at risk foot care, callus trim.  Marzetta Board, DPM

## 2020-04-18 DIAGNOSIS — M810 Age-related osteoporosis without current pathological fracture: Secondary | ICD-10-CM | POA: Diagnosis not present

## 2020-05-09 ENCOUNTER — Other Ambulatory Visit: Payer: Self-pay

## 2020-05-09 ENCOUNTER — Ambulatory Visit (INDEPENDENT_AMBULATORY_CARE_PROVIDER_SITE_OTHER): Payer: Medicare Other | Admitting: Podiatry

## 2020-05-09 ENCOUNTER — Encounter: Payer: Self-pay | Admitting: Podiatry

## 2020-05-09 DIAGNOSIS — L97521 Non-pressure chronic ulcer of other part of left foot limited to breakdown of skin: Secondary | ICD-10-CM | POA: Diagnosis not present

## 2020-05-09 NOTE — Patient Instructions (Signed)
Purchase non-medicated felt horse shoe pads from [employer].

## 2020-05-09 NOTE — Progress Notes (Signed)
Subjective:  Patient ID: [name], female    DOB: 02-23-1934,  MRN: [id number]  86 y.o. female is seen for follow up ulcer submet head 2 left foot.    Wound Care regimen: daily Mupirocin Ointment dressing changes.  Hx confirmed with [name].  Patient states right foot is more symptomatic on today's visit.   Patient denies fever, chills, night sweats, nausea, or vomiting.  Past Medical History:  Diagnosis Date  . Abnormal cardiovascular stress test 12/2013   s/p cath with mild nonobstructive CAD normal EF - managed medically  . Breast cancer ([place]) 2008   RT, chemo, lumpectomy  . Diverticulitis   . DVT (deep vein thrombosis) in pregnancy    site of PICC catheter  . HTN (hypertension)   . Hyperlipidemia   . Hypothyroidism   . Kidney tumor 2010   RFA  . Personal history of chemotherapy   . Personal history of radiation therapy   . Prediabetes   . Rheumatoid arthritis(714.0)   . UTI (lower urinary tract infection)      Past Surgical History:  Procedure Laterality Date  . ABDOMINAL SURGERY     for ruptured diverticuli  . bilateral foot surgery    . BREAST LUMPECTOMY Left   . CARDIAC CATHETERIZATION    . CERVICAL SPINE SURGERY    . LEFT HEART CATHETERIZATION WITH CORONARY ANGIOGRAM N/A 01/17/2014   Procedure: LEFT HEART CATHETERIZATION WITH CORONARY ANGIOGRAM;  Surgeon: Peter M Martinique, MD;  Location: [facility] CATH LAB;  Service: Cardiovascular;  Laterality: N/A;  . OVARIAN CYST REMOVAL    . perforation of colon    . renal RFA    . take down of colonostomy  2011  . TONSILLECTOMY       Current Outpatient Medications on File Prior to Visit  Medication Sig Dispense Refill  . acetaminophen-codeine (TYLENOL #3) 300-30 MG per tablet Take 1 tablet by mouth every 4 (four) hours as needed for moderate pain.     Marland Kitchen ALPRAZolam (XANAX) 0.25 MG tablet Take 0.125 mg by mouth daily as needed for anxiety.     Marland Kitchen amLODipine (NORVASC) 2.5 MG tablet     . betamethasone dipropionate  (DIPROLENE) 0.05 % cream     . Calcium Citrate-Vitamin D (CITRACAL + D PO) Take 1 tablet by mouth daily.    . cholecalciferol (VITAMIN D) 1000 UNITS tablet Take 1,000 Units by mouth daily.    . cyclobenzaprine (FLEXERIL) 10 MG tablet TK 1/2 TO 1 T PO Q 8 H PRF MUSCLE SPASM    . denosumab (PROLIA) 60 MG/ML SOLN injection Inject 60 mg into the skin every 6 (six) months. Administer in upper arm, thigh, or abdomen    . dicyclomine (BENTYL) 10 MG capsule Take 10 mg by mouth daily as needed for spasms.     . fish oil-omega-3 fatty acids 1000 MG capsule Take 1,200 mg by mouth daily.     . folic acid (FOLVITE) 1 MG tablet Take 2 mg by mouth daily.     . hydrocortisone 2.5 % cream hydrocortisone 2.5 % topical cream    . levothyroxine (SYNTHROID, LEVOTHROID) 150 MCG tablet Take 150 mcg by mouth daily before breakfast.    . loratadine (CLARITIN) 10 MG tablet Take by mouth.    . Melatonin 2.5 MG CAPS Take 2.5 mg by mouth at bedtime as needed.     . methotrexate (50 MG/ML) 1 G injection Inject 0.8 mg into the vein once a week. Sunday    .  Multiple Vitamin (MULTIVITAMIN) capsule Take 1 capsule by mouth daily.    . mupirocin ointment (BACTROBAN) 2 % Apply to left foot once daily. 22 g 0  . naproxen (NAPRELAN) 500 MG 24 hr tablet Take 500 mg by mouth daily with breakfast.     . naproxen (NAPROSYN) 500 MG tablet naproxen 500 mg tablet    . neomycin-polymyxin-dexameth (MAXITROL) 0.1 % OINT neomycin 3.5 mg/g-polymyxin B 10,000 unit/g-dexameth 0.1 % eye oint  APPLY TO LIDS TWICE A DAY FOR 10 DAYS    . Polyethylene Glycol 3350 (MIRALAX PO) Take by mouth See admin instructions. 3/4 a capful once daily    . potassium chloride (K-DUR) 10 MEQ tablet Take by mouth.    . potassium chloride SA (K-DUR,KLOR-CON) 20 MEQ tablet Take 20 mEq by mouth daily.    . predniSONE (DELTASONE) 5 MG tablet prednisone 5 mg tablets in a dose pack  Take 1 dose pk by oral route as directed for 6 days.    . ranitidine (ZANTAC) 75 MG tablet  Take 75 mg by mouth as needed for heartburn.    . simvastatin (ZOCOR) 20 MG tablet Take 20 mg by mouth every evening.    . simvastatin (ZOCOR) 40 MG tablet     . triamterene-hydrochlorothiazide (MAXZIDE-25) 37.5-25 MG per tablet Take 1 tablet by mouth daily.    . vitamin B-12 (CYANOCOBALAMIN) 500 MCG tablet Take by mouth.     No current facility-administered medications on file prior to visit.     Allergies  Allergen Reactions  . Dilaudid [Hydromorphone Hcl] Nausea And Vomiting  . Alendronate Other (See Comments)  . Morphine And Related   . Phenergan [Promethazine Hcl]   . Tamiflu [Oseltamivir Phosphate]     rash  . Taxol [Paclitaxel]     rash  . Tetanus Immune Globulin   . Tetanus Toxoids   . Tramadol Other (See Comments)  . Oseltamivir Rash     Family History  Problem Relation Age of Onset  . Heart disease Mother   . Cancer Mother        stomach  . Breast cancer Mother   . Heart disease Father   . Breast cancer Daughter      Social History   Occupational History  . Not on file  Tobacco Use  . Smoking status: Never Smoker  . Smokeless tobacco: Never Used  Substance and Sexual Activity  . Alcohol use: No  . Drug use: Not on file  . Sexual activity: Not on file     Immunization History  Administered Date(s) Administered  . Influenza Split 07/20/2013, 06/27/2014  . PFIZER SARS-COV-2 Vaccination 11/17/2019, 12/14/2019  . Pneumococcal Polysaccharide-23 02/18/2007     Review of systems: wound plantar left foot  Objective:  Physical Exam: There were no vitals filed for this visit.   86 y.o. Caucasian female WD, WN in NAD. AAO x 3.  Capillary refill time to digits immediate b/l. Palpable DP pulses b/l. Palpable PT pulses b/l. Pedal hair present b/l. Skin temperature gradient within normal limits b/l. No edema noted b/l.  Pedal skin with normal turgor, texture and tone bilaterally. No open wounds bilaterally. No interdigital macerations bilaterally.  No  images are attached to the encounter.  Wound Location: submet head 2 left foot There is minimal hyperkeratosis present in the wound with subdermal hemorrhage. Predebridement Wound Measurement: 3.0 x 1.5 x  Postdebridement Wound Measurement: 3.1 x 1.5 x 0.1 cm partial thickness Wound Base: epithelialized with visible subdermal hemorrhage Peri-wound:  Normal Exudate: None: wound tissue dry Blood Loss during debridement: 0 cc's. Description of tissue removed from ulceration today:  hyperkeratosis. Signs of clinical bacterial infection are absent. Material in wound which inhibits healing/promotes adjacent tissue breakdown: hyperkeratosis  Wound Location: submet head 2 right foot There is minimal hyperkeratosis present in the wound. Predebridement Wound Measurement: 2.3 x 1.5 cm Postdebridement Wound Measurement 2.5 x 1.5 x 0.1 cm partial thickness Wound Base: epithelialized with visible subdermal hemorrhage Peri-wound: Normal Exudate: None: wound tissue dry Blood Loss during debridement: 0 cc's. Description of tissue removed from ulceration today:  hyperkeratosis. Signs of clinical bacterial infection are absent. Material in wound which inhibits healing/promotes adjacent tissue breakdown: hyperkeratosis  Normal muscle strength 5/5 to all lower extremity muscle groups bilaterally. No pain crepitus or joint limitation noted with ROM b/l. Plantarflexed metatarsal(s) 2nd metatarsals left foot and right foot. Hallux valgus with bunion deformity noted b/l LE. Hammertoes noted to the b/l LE.  Protective sensation intact 5/5 intact bilaterally with 10g monofilament b/l. Vibratory sensation intact b/l. Proprioception intact bilaterally.  Assessment:   1. Skin ulcer of left foot, limited to breakdown of skin ([place])    Plan:  -Patient was evaluated and treated and all questions answered.  -Applied and dispensed horseshoe pads for offloading. She is aware there is a reusable horseshoe pad available  to purchase. She will look on [employer] for nonmedicated felt horshoe pads. We reprinted this information for her on today. -Patient/POA/Family member educated on diagnosis and treatment plan of routine ulcer debridement/wound care.  -Type/amount of devitalized tissue removed: hyperkeratosis -Today's ulcer size post-debridement: 3.1 x 1.5 x 0.1 cm left foot; 2.5 x 1.5 x 0.1 cm; both partial thickness. -Wounds responded well to today's debridement. -Patient risk factors affecting healing of ulcer: foot deformity; long term use of immunosuppressive drugs for RA -Frequency of debridement needed to achieve healing: every 4 weeks -Patient to continue soft, supportive shoe gear daily. -Patient to report any pedal injuries to medical professional immediately. -Patient/POA to call should there be question/concern in the interim.  Return in about 4 weeks (around 06/06/2020) for follow up preulcerative callus.  Marzetta Board, DPM

## 2020-05-10 DIAGNOSIS — Z85828 Personal history of other malignant neoplasm of skin: Secondary | ICD-10-CM | POA: Diagnosis not present

## 2020-05-10 DIAGNOSIS — L570 Actinic keratosis: Secondary | ICD-10-CM | POA: Diagnosis not present

## 2020-05-15 DIAGNOSIS — K136 Irritative hyperplasia of oral mucosa: Secondary | ICD-10-CM | POA: Diagnosis not present

## 2020-05-28 DIAGNOSIS — Z23 Encounter for immunization: Secondary | ICD-10-CM | POA: Diagnosis not present

## 2020-06-06 ENCOUNTER — Other Ambulatory Visit: Payer: Self-pay

## 2020-06-06 ENCOUNTER — Ambulatory Visit (INDEPENDENT_AMBULATORY_CARE_PROVIDER_SITE_OTHER): Payer: Medicare Other | Admitting: Podiatry

## 2020-06-06 ENCOUNTER — Encounter: Payer: Self-pay | Admitting: Podiatry

## 2020-06-06 DIAGNOSIS — L97521 Non-pressure chronic ulcer of other part of left foot limited to breakdown of skin: Secondary | ICD-10-CM

## 2020-06-06 DIAGNOSIS — L97511 Non-pressure chronic ulcer of other part of right foot limited to breakdown of skin: Secondary | ICD-10-CM

## 2020-06-06 DIAGNOSIS — M216X9 Other acquired deformities of unspecified foot: Secondary | ICD-10-CM

## 2020-06-06 DIAGNOSIS — M05772 Rheumatoid arthritis with rheumatoid factor of left ankle and foot without organ or systems involvement: Secondary | ICD-10-CM

## 2020-06-06 DIAGNOSIS — M05771 Rheumatoid arthritis with rheumatoid factor of right ankle and foot without organ or systems involvement: Secondary | ICD-10-CM

## 2020-06-07 DIAGNOSIS — M2550 Pain in unspecified joint: Secondary | ICD-10-CM | POA: Diagnosis not present

## 2020-06-07 DIAGNOSIS — M0579 Rheumatoid arthritis with rheumatoid factor of multiple sites without organ or systems involvement: Secondary | ICD-10-CM | POA: Diagnosis not present

## 2020-06-07 DIAGNOSIS — Z79899 Other long term (current) drug therapy: Secondary | ICD-10-CM | POA: Diagnosis not present

## 2020-06-07 DIAGNOSIS — M150 Primary generalized (osteo)arthritis: Secondary | ICD-10-CM | POA: Diagnosis not present

## 2020-06-07 DIAGNOSIS — Z6824 Body mass index (BMI) 24.0-24.9, adult: Secondary | ICD-10-CM | POA: Diagnosis not present

## 2020-06-10 NOTE — Progress Notes (Signed)
Subjective:  Patient ID: [name], female    DOB: [birth year],  MRN: [id number]  86 y.o. female is seen for follow up partial thickness ulcerations submet heads 2nd bilateral feet. She has h/o rheumatoid arthritis contributing to these chronic wounds.  Wound Care regimen: daily Mupirocin Ointment dressing changes.  Hx confirmed with [name].  Patient states right foot remains more symptomatic than the left foot on today.  [name] denies any redness, drainage or warmth of both areas of Kerry feet. She denies  fever, chills, night sweats, nausea, or vomiting.  [name] is also present during today's visit.   They are excited because they are picking up their daughter (who lives in [place] and is now in the states) today.   She states she did get a bag of felt horseshoe pads for daily offloading of the lesions and they help.   Past Medical History:  Diagnosis Date  . Abnormal cardiovascular stress test 12/2013   s/p cath with mild nonobstructive CAD normal EF - managed medically  . Breast cancer ([place]) 2008   RT, chemo, lumpectomy  . Diverticulitis   . DVT (deep vein thrombosis) in pregnancy    site of PICC catheter  . HTN (hypertension)   . Hyperlipidemia   . Hypothyroidism   . Kidney tumor 2010   RFA  . Personal history of chemotherapy   . Personal history of radiation therapy   . Prediabetes   . Rheumatoid arthritis(714.0)   . UTI (lower urinary tract infection)      Past Surgical History:  Procedure Laterality Date  . ABDOMINAL SURGERY     for ruptured diverticuli  . bilateral foot surgery    . BREAST LUMPECTOMY Left   . CARDIAC CATHETERIZATION    . CERVICAL SPINE SURGERY    . LEFT HEART CATHETERIZATION WITH CORONARY ANGIOGRAM N/A 01/17/2014   Procedure: LEFT HEART CATHETERIZATION WITH CORONARY ANGIOGRAM;  Surgeon: Peter M Martinique, MD;  Location: [facility] CATH LAB;  Service: Cardiovascular;  Laterality: N/A;  . OVARIAN CYST REMOVAL    . perforation of colon    .  renal RFA    . take down of colonostomy  2011  . TONSILLECTOMY       Current Outpatient Medications on File Prior to Visit  Medication Sig Dispense Refill  . acetaminophen-codeine (TYLENOL #3) 300-30 MG per tablet Take 1 tablet by mouth every 4 (four) hours as needed for moderate pain.     Marland Kitchen ALPRAZolam (XANAX) 0.25 MG tablet Take 0.125 mg by mouth daily as needed for anxiety.     Marland Kitchen amLODipine (NORVASC) 2.5 MG tablet     . betamethasone dipropionate (DIPROLENE) 0.05 % cream     . Calcium Citrate-Vitamin D (CITRACAL + D PO) Take 1 tablet by mouth daily.    . cholecalciferol (VITAMIN D) 1000 UNITS tablet Take 1,000 Units by mouth daily.    . cyclobenzaprine (FLEXERIL) 10 MG tablet TK 1/2 TO 1 T PO Q 8 H PRF MUSCLE SPASM    . denosumab (PROLIA) 60 MG/ML SOLN injection Inject 60 mg into the skin every 6 (six) months. Administer in upper arm, thigh, or abdomen    . dicyclomine (BENTYL) 10 MG capsule Take 10 mg by mouth daily as needed for spasms.     . fish oil-omega-3 fatty acids 1000 MG capsule Take 1,200 mg by mouth daily.     . folic acid (FOLVITE) 1 MG tablet Take 2 mg by mouth daily.     Marland Kitchen  hydrocortisone 2.5 % cream hydrocortisone 2.5 % topical cream    . levothyroxine (SYNTHROID, LEVOTHROID) 150 MCG tablet Take 150 mcg by mouth daily before breakfast.    . loratadine (CLARITIN) 10 MG tablet Take by mouth.    . Melatonin 2.5 MG CAPS Take 2.5 mg by mouth at bedtime as needed.     . methotrexate (50 MG/ML) 1 G injection Inject 0.8 mg into the vein once a week. Sunday    . Multiple Vitamin (MULTIVITAMIN) capsule Take 1 capsule by mouth daily.    . mupirocin ointment (BACTROBAN) 2 % Apply to left foot once daily. 22 g 0  . naproxen (NAPRELAN) 500 MG 24 hr tablet Take 500 mg by mouth daily with breakfast.     . naproxen (NAPROSYN) 500 MG tablet naproxen 500 mg tablet    . neomycin-polymyxin-dexameth (MAXITROL) 0.1 % OINT neomycin 3.5 mg/g-polymyxin B 10,000 unit/g-dexameth 0.1 % eye oint   APPLY TO LIDS TWICE A DAY FOR 10 DAYS    . Polyethylene Glycol 3350 (MIRALAX PO) Take by mouth See admin instructions. 3/4 a capful once daily    . potassium chloride (K-DUR) 10 MEQ tablet Take by mouth.    . potassium chloride SA (K-DUR,KLOR-CON) 20 MEQ tablet Take 20 mEq by mouth daily.    . predniSONE (DELTASONE) 5 MG tablet prednisone 5 mg tablets in a dose pack  Take 1 dose pk by oral route as directed for 6 days.    . ranitidine (ZANTAC) 75 MG tablet Take 75 mg by mouth as needed for heartburn.    . simvastatin (ZOCOR) 20 MG tablet Take 20 mg by mouth every evening.    . simvastatin (ZOCOR) 40 MG tablet     . triamterene-hydrochlorothiazide (MAXZIDE-25) 37.5-25 MG per tablet Take 1 tablet by mouth daily.    . vitamin B-12 (CYANOCOBALAMIN) 500 MCG tablet Take by mouth.     No current facility-administered medications on file prior to visit.     Allergies  Allergen Reactions  . Dilaudid [Hydromorphone Hcl] Nausea And Vomiting  . Alendronate Other (See Comments)  . Morphine And Related   . Phenergan [Promethazine Hcl]   . Tamiflu [Oseltamivir Phosphate]     rash  . Taxol [Paclitaxel]     rash  . Tetanus Immune Globulin   . Tetanus Toxoids   . Tramadol Other (See Comments)  . Oseltamivir Rash     Family History  Problem Relation Age of Onset  . Heart disease Mother   . Cancer Mother        stomach  . Breast cancer Mother   . Heart disease Father   . Breast cancer Daughter      Social History   Occupational History  . Not on file  Tobacco Use  . Smoking status: Never Smoker  . Smokeless tobacco: Never Used  Substance and Sexual Activity  . Alcohol use: No  . Drug use: Not on file  . Sexual activity: Not on file     Immunization History  Administered Date(s) Administered  . Influenza Split 07/20/2013, 06/27/2014  . PFIZER SARS-COV-2 Vaccination 11/17/2019, 12/14/2019  . Pneumococcal Polysaccharide-23 02/18/2007     Review of systems: wound plantar left  foot  Objective:  Physical Exam: There were no vitals filed for this visit.   86 y.o. Caucasian female WD, WN in NAD. AAO x 3.  Capillary refill time to digits immediate b/l. Palpable DP pulses b/l. Palpable PT pulses b/l. Pedal hair present b/l. Skin temperature  gradient within normal limits b/l. No edema noted b/l.  Pedal skin with normal turgor, texture and tone bilaterally. No open wounds bilaterally. No interdigital macerations bilaterally.  Wound Location: submet head 2 left foot There is minimal hyperkeratosis present in the wound with subdermal hemorrhage. Predebridement Wound Measurement: 3.0 x 1.5 x  Postdebridement Wound Measurement: 3.1 x 1.5 x 0.1 cm partial thickness Wound Base: epithelialized with visible subdermal hemorrhage Peri-wound: Normal Exudate: None: wound tissue dry Blood Loss during debridement: 0 cc's. Description of tissue removed from ulceration today:  hyperkeratosis. Signs of clinical bacterial infection are absent. Material in wound which inhibits healing/promotes adjacent tissue breakdown: hyperkeratosis  Wound Location: submet head 2 right foot There is moderate hyperkeratosis present in the wound. Predebridement Wound Measurement: 2.3 x 1.5 cm Postdebridement Wound Measurement 2.5 x 1.5 x 0.1 cm partial thickness Wound Base: epithelialized with visible subdermal hemorrhage Peri-wound: Normal Exudate: None: wound tissue dry Blood Loss during debridement: 0 cc's. Description of tissue removed from ulceration today:  hyperkeratosis. Signs of clinical bacterial infection are absent. Material in wound which inhibits healing/promotes adjacent tissue breakdown: hyperkeratosis  Normal muscle strength 5/5 to all lower extremity muscle groups bilaterally. No pain crepitus or joint limitation noted with ROM b/l. Plantarflexed metatarsal(s) 2nd metatarsals left foot and right foot. Hallux valgus with bunion deformity noted b/l LE. Hammertoes noted to the b/l  LE. She ambulates independent of any assistive aids.  Protective sensation intact 5/5 intact bilaterally with 10g monofilament b/l. Vibratory sensation intact b/l. Proprioception intact bilaterally.  Assessment:   1. Skin ulcer of left foot, limited to breakdown of skin ([place])   2. Skin ulcer of foot, right, limited to breakdown of skin ([place])   3. Plantar flexed metatarsal, unspecified laterality   4. Rheumatoid arthritis involving both feet with positive rheumatoid factor ([facility])    Plan:  -Patient was evaluated and treated and all questions answered.  -Applied and dispensed horseshoe pads for offloading. She will continue nonmedicated felt horshoe pads for offloading daily. Continue Mupirocin Ointment daily.  -Patient/POA/Family member educated on diagnosis and treatment plan of routine ulcer debridement/wound care.  -Type/amount of devitalized tissue removed: hemorrhagic hyperkeratosis -Today's ulcer size post-debridement: 3.1 x 1.5 x 0.1 cm left foot; 2.5 x 1.5 x 0.1 cm; both partial thickness. -Wounds responded well to today's debridement. -Patient risk factors affecting healing of ulcer: foot deformity; long term use of immunosuppressive drugs for RA -Frequency of debridement needed to achieve healing: every 4 weeks -Patient to continue soft, supportive shoe gear daily. -Patient to report any pedal injuries to medical professional immediately. -Patient/POA to call should there be question/concern in the interim.  Return in about  weeks (around 07/04/2020) for follow up preulcerative callus.  Marzetta Board, DPM

## 2020-06-11 DIAGNOSIS — E039 Hypothyroidism, unspecified: Secondary | ICD-10-CM | POA: Diagnosis not present

## 2020-06-11 DIAGNOSIS — Z822 Family history of deafness and hearing loss: Secondary | ICD-10-CM | POA: Diagnosis not present

## 2020-06-11 DIAGNOSIS — H903 Sensorineural hearing loss, bilateral: Secondary | ICD-10-CM | POA: Diagnosis not present

## 2020-07-04 ENCOUNTER — Other Ambulatory Visit: Payer: Self-pay

## 2020-07-04 ENCOUNTER — Ambulatory Visit (INDEPENDENT_AMBULATORY_CARE_PROVIDER_SITE_OTHER): Payer: Medicare Other | Admitting: Podiatry

## 2020-07-04 ENCOUNTER — Encounter: Payer: Self-pay | Admitting: Podiatry

## 2020-07-04 DIAGNOSIS — M05772 Rheumatoid arthritis with rheumatoid factor of left ankle and foot without organ or systems involvement: Secondary | ICD-10-CM

## 2020-07-04 DIAGNOSIS — L97511 Non-pressure chronic ulcer of other part of right foot limited to breakdown of skin: Secondary | ICD-10-CM

## 2020-07-04 DIAGNOSIS — M216X9 Other acquired deformities of unspecified foot: Secondary | ICD-10-CM | POA: Diagnosis not present

## 2020-07-04 DIAGNOSIS — L97521 Non-pressure chronic ulcer of other part of left foot limited to breakdown of skin: Secondary | ICD-10-CM

## 2020-07-04 DIAGNOSIS — M05771 Rheumatoid arthritis with rheumatoid factor of right ankle and foot without organ or systems involvement: Secondary | ICD-10-CM

## 2020-07-04 MED ORDER — CEPHALEXIN 500 MG PO CAPS: 500.0000 mg | ORAL_CAPSULE | Freq: Three times a day (TID) | ORAL | 0 refills | Status: AC

## 2020-07-08 NOTE — Progress Notes (Signed)
Subjective:  Patient ID: [name], female    DOB: Jan 27, 1934,  MRN: [id number]  [name] is seen for follow up partial thickness ulcerations submet heads 2nd bilateral feet. She has h/o rheumatoid arthritis contributing to these chronic wounds.  Wound Care regimen: daily Mupirocin Ointment dressing changes.  Hx confirmed with [name].  Patient states her left foot is more symptomatic on today's visit. She thinks part of this is due to change in weather.  [name] denies any redness, drainage or warmth of both areas of her feet. She denies  fever, chills, night sweats, nausea, or vomiting.  She states she really enjoyed her daughter visiting them.   Past Medical History:  Diagnosis Date  . Abnormal cardiovascular stress test 12/2013   s/p cath with mild nonobstructive CAD normal EF - managed medically  . Breast cancer ([place]) 2008   RT, chemo, lumpectomy  . Diverticulitis   . DVT (deep vein thrombosis) in pregnancy    site of PICC catheter  . HTN (hypertension)   . Hyperlipidemia   . Hypothyroidism   . Kidney tumor 2010   RFA  . Personal history of chemotherapy   . Personal history of radiation therapy   . Prediabetes   . Rheumatoid arthritis(714.0)   . UTI (lower urinary tract infection)      Past Surgical History:  Procedure Laterality Date  . ABDOMINAL SURGERY     for ruptured diverticuli  . bilateral foot surgery    . BREAST LUMPECTOMY Left   . CARDIAC CATHETERIZATION    . CERVICAL SPINE SURGERY    . LEFT HEART CATHETERIZATION WITH CORONARY ANGIOGRAM N/A 01/17/2014   Procedure: LEFT HEART CATHETERIZATION WITH CORONARY ANGIOGRAM;  Surgeon: Peter M Martinique, MD;  Location: [facility] CATH LAB;  Service: Cardiovascular;  Laterality: N/A;  . OVARIAN CYST REMOVAL    . perforation of colon    . renal RFA    . take down of colonostomy  2011  . TONSILLECTOMY       Current Outpatient Medications on File Prior to Visit  Medication Sig Dispense Refill  .  acetaminophen-codeine (TYLENOL #3) 300-30 MG per tablet Take 1 tablet by mouth every 4 (four) hours as needed for moderate pain.     Marland Kitchen ALPRAZolam (XANAX) 0.25 MG tablet Take 0.125 mg by mouth daily as needed for anxiety.     Marland Kitchen amLODipine (NORVASC) 2.5 MG tablet     . betamethasone dipropionate (DIPROLENE) 0.05 % cream     . Calcium Citrate-Vitamin D (CITRACAL + D PO) Take 1 tablet by mouth daily.    . cholecalciferol (VITAMIN D) 1000 UNITS tablet Take 1,000 Units by mouth daily.    . cyclobenzaprine (FLEXERIL) 10 MG tablet TK 1/2 TO 1 T PO Q 8 H PRF MUSCLE SPASM    . denosumab (PROLIA) 60 MG/ML SOLN injection Inject 60 mg into the skin every 6 (six) months. Administer in upper arm, thigh, or abdomen    . dicyclomine (BENTYL) 10 MG capsule Take 10 mg by mouth daily as needed for spasms.     . fish oil-omega-3 fatty acids 1000 MG capsule Take 1,200 mg by mouth daily.     . folic acid (FOLVITE) 1 MG tablet Take 2 mg by mouth daily.     . hydrocortisone 2.5 % cream hydrocortisone 2.5 % topical cream    . levothyroxine (SYNTHROID, LEVOTHROID) 150 MCG tablet Take 150 mcg by mouth daily before breakfast.    . loratadine (CLARITIN) 10  MG tablet Take by mouth.    . Melatonin 2.5 MG CAPS Take 2.5 mg by mouth at bedtime as needed.     . methotrexate (50 MG/ML) 1 G injection Inject 0.8 mg into the vein once a week. Sunday    . Multiple Vitamin (MULTIVITAMIN) capsule Take 1 capsule by mouth daily.    . mupirocin ointment (BACTROBAN) 2 % Apply to left foot once daily. 22 g 0  . naproxen (NAPRELAN) 500 MG 24 hr tablet Take 500 mg by mouth daily with breakfast.     . naproxen (NAPROSYN) 500 MG tablet naproxen 500 mg tablet    . neomycin-polymyxin-dexameth (MAXITROL) 0.1 % OINT neomycin 3.5 mg/g-polymyxin B 10,000 unit/g-dexameth 0.1 % eye oint  APPLY TO LIDS TWICE A DAY FOR 10 DAYS    . Polyethylene Glycol 3350 (MIRALAX PO) Take by mouth See admin instructions. 3/4 a capful once daily    . potassium chloride  (K-DUR) 10 MEQ tablet Take by mouth.    . potassium chloride SA (K-DUR,KLOR-CON) 20 MEQ tablet Take 20 mEq by mouth daily.    . predniSONE (DELTASONE) 5 MG tablet prednisone 5 mg tablets in a dose pack  Take 1 dose pk by oral route as directed for 6 days.    . ranitidine (ZANTAC) 75 MG tablet Take 75 mg by mouth as needed for heartburn.    . simvastatin (ZOCOR) 20 MG tablet Take 20 mg by mouth every evening.    . simvastatin (ZOCOR) 40 MG tablet     . triamterene-hydrochlorothiazide (MAXZIDE-25) 37.5-25 MG per tablet Take 1 tablet by mouth daily.    . vitamin B-12 (CYANOCOBALAMIN) 500 MCG tablet Take by mouth.     No current facility-administered medications on file prior to visit.     Allergies  Allergen Reactions  . Dilaudid [Hydromorphone Hcl] Nausea And Vomiting  . Alendronate Other (See Comments)  . Morphine And Related   . Phenergan [Promethazine Hcl]   . Tamiflu [Oseltamivir Phosphate]     rash  . Taxol [Paclitaxel]     rash  . Tetanus Immune Globulin   . Tetanus Toxoids   . Tramadol Other (See Comments)  . Oseltamivir Rash     Family History  Problem Relation Age of Onset  . Heart disease Mother   . Cancer Mother        stomach  . Breast cancer Mother   . Heart disease Father   . Breast cancer Daughter      Social History   Occupational History  . Not on file  Tobacco Use  . Smoking status: Never Smoker  . Smokeless tobacco: Never Used  Substance and Sexual Activity  . Alcohol use: No  . Drug use: Not on file  . Sexual activity: Not on file     Immunization History  Administered Date(s) Administered  . Influenza Split 07/20/2013, 06/27/2014  . PFIZER SARS-COV-2 Vaccination 11/17/2019, 12/14/2019  . Pneumococcal Polysaccharide-23 02/18/2007     Review of systems: wound plantar left foot  Objective:  Physical Exam: There were no vitals filed for this visit.   86 y.o. Caucasian female WD, WN in NAD. AAO x 3.  Capillary refill time to digits  immediate b/l. Palpable DP pulses b/l. Palpable PT pulses b/l. Pedal hair present b/l. Skin temperature gradient within normal limits b/l. No edema noted b/l.  Pedal skin with normal turgor, texture and tone bilaterally. No open wounds bilaterally. No interdigital macerations bilaterally.  Wound Location: submet head 2 left  foot There is minimal hyperkeratosis present in the wound with subdermal hemorrhage. Predebridement Wound Measurement: 3.0 x 1.5 cm Postdebridement Wound Measurement: 3.1 x 1.5 x 0.1 cm partial thickness Wound Base: epithelialized with visible subdermal hemorrhage Peri-wound: Normal Exudate: None: wound tissue dry Blood Loss during debridement: 0 cc's. Description of tissue removed from ulceration today:  hyperkeratosis. Signs of clinical bacterial infection are absent. Material in wound which inhibits healing/promotes adjacent tissue breakdown: hyperkeratosis  Wound Location: submet head 2 right foot There is moderate hyperkeratosis present in the wound. Predebridement Wound Measurement: 2.3 x 1.5 cm Postdebridement Wound Measurement 2.5 x 1.5 x 0.1 cm partial thickness Wound Base: epithelialized with visible subdermal hemorrhage Peri-wound: Normal Exudate: None: wound tissue dry Blood Loss during debridement: 0 cc's. Description of tissue removed from ulceration today:  hyperkeratosis. Signs of clinical bacterial infection are absent. Material in wound which inhibits healing/promotes adjacent tissue breakdown: hyperkeratosis  Normal muscle strength 5/5 to all lower extremity muscle groups bilaterally. No pain crepitus or joint limitation noted with ROM b/l. Plantarflexed metatarsal(s) 2nd metatarsals left foot and right foot. Hallux valgus with bunion deformity noted b/l LE. Hammertoes noted to the b/l LE. She ambulates independent of any assistive aids.  Protective sensation intact 5/5 intact bilaterally with 10g monofilament b/l. Vibratory sensation intact b/l.  Proprioception intact bilaterally.  Assessment:   1. Skin ulcer of left foot, limited to breakdown of skin ([place])   2. Skin ulcer of foot, right, limited to breakdown of skin ([place])   3. Plantar flexed metatarsal, unspecified laterality   4. Rheumatoid arthritis involving both feet with positive rheumatoid factor (HCC)    Plan:  -Patient was evaluated and treated and all questions answered.  -Applied and dispensed horseshoe pads for offloading. She will continue nonmedicated felt horshoe pads for offloading daily. Continue Mupirocin Ointment daily.  -Due to tenderness of left foot, will order Keflex 500 mg po tid x 10 days.  -Patient/POA/Family member educated on diagnosis and treatment plan of routine ulcer debridement/wound care.  -Type/amount of devitalized tissue removed: hemorrhagic hyperkeratosis -Today's ulcer size post-debridement: 3.1 x 1.5 x 0.1 cm left foot; 2.5 x 1.5 x 0.1 cm; both partial thickness. -Wounds responded well to today's debridement. -Patient risk factors affecting healing of ulcer: foot deformity; long term use of immunosuppressive drugs for RA -Frequency of debridement needed to achieve healing: every 4 weeks -Patient to continue soft, supportive shoe gear daily. -Patient to report any pedal injuries to medical professional immediately. -Patient/POA to call should there be question/concern in the interim.  Return in about 4 weeks for follow up preulcerative callus.  Marzetta Board, DPM

## 2020-07-17 DIAGNOSIS — M5136 Other intervertebral disc degeneration, lumbar region: Secondary | ICD-10-CM | POA: Diagnosis not present

## 2020-07-21 DIAGNOSIS — Z23 Encounter for immunization: Secondary | ICD-10-CM | POA: Diagnosis not present

## 2020-07-23 ENCOUNTER — Ambulatory Visit (INDEPENDENT_AMBULATORY_CARE_PROVIDER_SITE_OTHER): Payer: Medicare Other

## 2020-07-23 ENCOUNTER — Ambulatory Visit (INDEPENDENT_AMBULATORY_CARE_PROVIDER_SITE_OTHER): Payer: Medicare Other | Admitting: Podiatry

## 2020-07-23 ENCOUNTER — Other Ambulatory Visit: Payer: Self-pay

## 2020-07-23 DIAGNOSIS — L84 Corns and callosities: Secondary | ICD-10-CM

## 2020-07-23 DIAGNOSIS — M069 Rheumatoid arthritis, unspecified: Secondary | ICD-10-CM

## 2020-07-23 DIAGNOSIS — M216X9 Other acquired deformities of unspecified foot: Secondary | ICD-10-CM | POA: Diagnosis not present

## 2020-07-23 DIAGNOSIS — M2042 Other hammer toe(s) (acquired), left foot: Secondary | ICD-10-CM | POA: Diagnosis not present

## 2020-07-23 DIAGNOSIS — L97521 Non-pressure chronic ulcer of other part of left foot limited to breakdown of skin: Secondary | ICD-10-CM | POA: Diagnosis not present

## 2020-07-23 DIAGNOSIS — M79672 Pain in left foot: Secondary | ICD-10-CM

## 2020-07-24 ENCOUNTER — Ambulatory Visit: Payer: Medicare Other | Admitting: Podiatry

## 2020-07-24 NOTE — Progress Notes (Signed)
  Subjective:  Patient ID: [name], female    DOB: [birth year],  MRN: [id number]  Chief Complaint  Patient presents with  . Callouses    PT stated that she thinks she has an infection in it    86 y.o. female presents with the above complaint. History confirmed with patient.  She is referred to me by Dr. Elisha Ponder for a left foot ulceration/preulcerative callus present for some time.  She has a history of rheumatoid arthritis.  Previous offloading pads have not helped her.  Objective:  Physical Exam: warm, good capillary refill, no trophic changes or ulcerative lesions, normal DP and PT pulses and normal sensory exam. Left Foot: Large preulcerative callus proximal 1 cm diameter under the third metatarsal, very painful to palpation, after debridement of the hyperkeratosis there is no underlying skin breakdown   Radiographs: X-ray of the left foot: X-ray of the left foot: Moderate arthritis of the first metatarsophalangeal joint, severe arthritis with destruction of the metatarsophalangeal joints of the second through fifth MTPJ's pes planus foot type Assessment:   1. Foot pain, left   2. Rheumatoid arthritis of left foot, unspecified whether rheumatoid factor present ([place])   3. Plantar flexed metatarsal, unspecified laterality   4. Callus   5. Hammertoe of left foot      Plan:  Patient was evaluated and treated and all questions answered.     Discussed treatment options in detail with her and the etiology of how her rheumatoid arthritis and significant hammertoe deformities contribute to retrograde pressure on the metatarsal head and submetatarsal pressure.  I recommended that we offload this with a metatarsal pad and this was dispensed today.  I debrided the callus to a comfortable level for her.  -We also discussed possible surgical treatment options for this as well including metatarsal head resection of the second through fifth metatarsals.  She would prefer to avoid any major  surgery at this point and I think this is reasonable.  She has moderate arthritis of the first metatarsophalangeal joint and it is possible that a compromise procedure could include foregoing the first metatarsophalangeal joint arthrodesis of the Hoffman procedure and simply proceed with resection of the metatarsal heads and metatarsophalangeal joints.  She will consider this if the offloading pads and debridement is not helpful.  Return in about 6 weeks (around 09/03/2020).

## 2020-08-01 ENCOUNTER — Ambulatory Visit: Payer: Medicare Other

## 2020-08-01 ENCOUNTER — Encounter: Payer: Self-pay | Admitting: Podiatry

## 2020-08-01 ENCOUNTER — Ambulatory Visit (INDEPENDENT_AMBULATORY_CARE_PROVIDER_SITE_OTHER): Payer: Medicare Other

## 2020-08-01 ENCOUNTER — Other Ambulatory Visit: Payer: Self-pay

## 2020-08-01 ENCOUNTER — Ambulatory Visit (INDEPENDENT_AMBULATORY_CARE_PROVIDER_SITE_OTHER): Payer: Medicare Other | Admitting: Podiatry

## 2020-08-01 DIAGNOSIS — M216X9 Other acquired deformities of unspecified foot: Secondary | ICD-10-CM

## 2020-08-01 DIAGNOSIS — L97929 Non-pressure chronic ulcer of unspecified part of left lower leg with unspecified severity: Secondary | ICD-10-CM

## 2020-08-01 DIAGNOSIS — L97522 Non-pressure chronic ulcer of other part of left foot with fat layer exposed: Secondary | ICD-10-CM | POA: Diagnosis not present

## 2020-08-01 DIAGNOSIS — I83219 Varicose veins of right lower extremity with both ulcer of unspecified site and inflammation: Secondary | ICD-10-CM

## 2020-08-01 DIAGNOSIS — M069 Rheumatoid arthritis, unspecified: Secondary | ICD-10-CM

## 2020-08-01 NOTE — Patient Instructions (Signed)
DRESSING CHANGES left foot:   A. IF DISPENSED, WEAR SURGICAL SHOE/BOOT AT ALL TIMES.  B. IF PRESCRIBED ORAL ANTIBIOTICS, TAKE ALL MEDICATION AS PRESCRIBED UNTIL ALL ARE GONE.  C. IF DOCTOR HAS DESIGNATED NONWEIGHTBEARING STATUS, PLEASE ADHERE TO INSTRUCTIONS.   1. KEEP left foot DRY AT ALL TIMES!!!!  2. CLEANSE ULCER WITH SALINE OR WOUND CLEANSER.  3. DAB DRY WITH GAUZE SPONGE.  4. APPLY A LIGHT AMOUNT OF Iodosorb Gel TO BASE OF ULCER.  5. APPLY OUTER DRESSING AS INSTRUCTED.  6. WEAR SURGICAL SHOE/BOOT DAILY AT ALL TIMES. IF SUPPLIED, WEAR HEEL PROTECTORS AT ALL TIMES WHEN IN BED.  7. DO NOT WALK BAREFOOT!!!  8.  IF YOU EXPERIENCE ANY FEVER, CHILLS, NIGHTSWEATS, NAUSEA OR VOMITING, ELEVATED OR LOW BLOOD SUGARS, REPORT TO EMERGENCY ROOM.  9. IF YOU EXPERIENCE INCREASED REDNESS, PAIN, SWELLING, DISCOLORATION, ODOR, PUS, DRAINAGE OR WARMTH OF YOUR FOOT, REPORT TO EMERGENCY ROOM.

## 2020-08-02 NOTE — Progress Notes (Signed)
Subjective:  Patient ID: [name], female    DOB: [birth year],  MRN: [id number]  86 y.o. female is seen today for evaluation of left foot.  She states she saw Dr. Sherryle Lis about 10 days ago. They discussed surgical options for her pedal deformities leading to chronic callosities/ulcers.   She states she had an episode of abdominal pain and called her husband into the bedroom to help her. Upon entering the doorway, she states her husband looked pale and passed out. She attempted to assist him and got her left foot tangled up. She is not quite sure of the injury sustained, but states her left foot is hurting and has also developed a blister. She denies any redness, drainage or odor from the foot. Denies any fever, chills, night sweats, nausea or vomiting.She does relate experiencing stomach upset with antibiotics prescribed on last visit, so she stopped taking them.  Her husband is now at a rehab facility and her daughter is back in the states from [place] assisting with his needs.      Past Medical History:  Diagnosis Date  . Abnormal cardiovascular stress test 12/2013   s/p cath with mild nonobstructive CAD normal EF - managed medically  . Breast cancer ([place]) 2008   RT, chemo, lumpectomy  . Diverticulitis   . DVT (deep vein thrombosis) in pregnancy    site of PICC catheter  . HTN (hypertension)   . Hyperlipidemia   . Hypothyroidism   . Kidney tumor 2010   RFA  . Personal history of chemotherapy   . Personal history of radiation therapy   . Prediabetes   . Rheumatoid arthritis(714.0)   . UTI (lower urinary tract infection)      Past Surgical History:  Procedure Laterality Date  . ABDOMINAL SURGERY     for ruptured diverticuli  . bilateral foot surgery    . BREAST LUMPECTOMY Left   . CARDIAC CATHETERIZATION    . CERVICAL SPINE SURGERY    . LEFT HEART CATHETERIZATION WITH CORONARY ANGIOGRAM N/A 01/17/2014   Procedure: LEFT HEART CATHETERIZATION WITH CORONARY ANGIOGRAM;   Surgeon: Peter M Martinique, MD;  Location: [facility] CATH LAB;  Service: Cardiovascular;  Laterality: N/A;  . OVARIAN CYST REMOVAL    . perforation of colon    . renal RFA    . take down of colonostomy  2011  . TONSILLECTOMY       Current Outpatient Medications on File Prior to Visit  Medication Sig Dispense Refill  . acetaminophen-codeine (TYLENOL #3) 300-30 MG per tablet Take 1 tablet by mouth every 4 (four) hours as needed for moderate pain.     Marland Kitchen ALPRAZolam (XANAX) 0.25 MG tablet Take 0.125 mg by mouth daily as needed for anxiety.     Marland Kitchen amLODipine (NORVASC) 2.5 MG tablet     . betamethasone dipropionate (DIPROLENE) 0.05 % cream     . Calcium Citrate-Vitamin D (CITRACAL + D PO) Take 1 tablet by mouth daily.    . cholecalciferol (VITAMIN D) 1000 UNITS tablet Take 1,000 Units by mouth daily.    . cyclobenzaprine (FLEXERIL) 10 MG tablet TK 1/2 TO 1 T PO Q 8 H PRF MUSCLE SPASM    . denosumab (PROLIA) 60 MG/ML SOLN injection Inject 60 mg into the skin every 6 (six) months. Administer in upper arm, thigh, or abdomen    . dicyclomine (BENTYL) 10 MG capsule Take 10 mg by mouth daily as needed for spasms.     . fish oil-omega-3 fatty acids  1000 MG capsule Take 1,200 mg by mouth daily.     . folic acid (FOLVITE) 1 MG tablet Take 2 mg by mouth daily.     . hydrocortisone 2.5 % cream hydrocortisone 2.5 % topical cream    . levothyroxine (SYNTHROID, LEVOTHROID) 150 MCG tablet Take 150 mcg by mouth daily before breakfast.    . loratadine (CLARITIN) 10 MG tablet Take by mouth.    . Melatonin 2.5 MG CAPS Take 2.5 mg by mouth at bedtime as needed.     . methotrexate (50 MG/ML) 1 G injection Inject 0.8 mg into the vein once a week. Sunday    . Methotrexate Sodium (METHOTREXATE, PF,) 50 MG/2ML injection     . Multiple Vitamin (MULTIVITAMIN) capsule Take 1 capsule by mouth daily.    . mupirocin ointment (BACTROBAN) 2 % Apply to left foot once daily. 22 g 0  . naproxen (NAPRELAN) 500 MG 24 hr tablet Take 500 mg by  mouth daily with breakfast.     . naproxen (NAPROSYN) 500 MG tablet naproxen 500 mg tablet    . neomycin-polymyxin-dexameth (MAXITROL) 0.1 % OINT neomycin 3.5 mg/g-polymyxin B 10,000 unit/g-dexameth 0.1 % eye oint  APPLY TO LIDS TWICE A DAY FOR 10 DAYS    . Polyethylene Glycol 3350 (MIRALAX PO) Take by mouth See admin instructions. 3/4 a capful once daily    . potassium chloride (K-DUR) 10 MEQ tablet Take by mouth.    . potassium chloride SA (K-DUR,KLOR-CON) 20 MEQ tablet Take 20 mEq by mouth daily.    . predniSONE (DELTASONE) 5 MG tablet prednisone 5 mg tablets in a dose pack  Take 1 dose pk by oral route as directed for 6 days.    . ranitidine (ZANTAC) 75 MG tablet Take 75 mg by mouth as needed for heartburn.    . simvastatin (ZOCOR) 20 MG tablet Take 20 mg by mouth every evening.    . simvastatin (ZOCOR) 40 MG tablet     . triamterene-hydrochlorothiazide (MAXZIDE-25) 37.5-25 MG per tablet Take 1 tablet by mouth daily.    . vitamin B-12 (CYANOCOBALAMIN) 500 MCG tablet Take by mouth.     No current facility-administered medications on file prior to visit.     Allergies  Allergen Reactions  . Dilaudid [Hydromorphone Hcl] Nausea And Vomiting  . Alendronate Other (See Comments)  . Morphine And Related   . Phenergan [Promethazine Hcl]   . Tamiflu [Oseltamivir Phosphate]     rash  . Taxol [Paclitaxel]     rash  . Tetanus Immune Globulin   . Tetanus Toxoids   . Tramadol Other (See Comments)  . Oseltamivir Rash     Family History  Problem Relation Age of Onset  . Heart disease Mother   . Cancer Mother        stomach  . Breast cancer Mother   . Heart disease Father   . Breast cancer Daughter      Social History   Occupational History  . Not on file  Tobacco Use  . Smoking status: Never Smoker  . Smokeless tobacco: Never Used  Substance and Sexual Activity  . Alcohol use: No  . Drug use: Not on file  . Sexual activity: Not on file     Immunization History   Administered Date(s) Administered  . Influenza Split 07/20/2013, 06/27/2014  . PFIZER SARS-COV-2 Vaccination 11/17/2019, 12/14/2019  . Pneumococcal Polysaccharide-23 02/18/2007      Objective:  Physical Exam: There were no vitals filed for this  visit.   BRAELIN BROSCH is a pleasant 86 y.o. Caucasian female WD, WN in NAD. AAO x 3.  Vascular Examination: Capillary refill time to digits immediate b/l. Palpable pedal pulses b/l LE. Pedal hair sparse. Lower extremity skin temperature gradient within normal limits. No pain with calf compression b/l. No edema noted b/l lower extremities.  Dermatological Examination: Pedal skin with normal turgor, texture and tone bilaterally. No interdigital macerations bilaterally. Toenails 1-5 b/l recently debrided.     Wound Location: submet head 3 left foot There is a moderate amount of devitalized tissue present in the wound. Predebridement Wound Measurement:  4.0  x 2.0 cm with bulla extending distally towards toes from wound Postdebridement Wound Measurement: 3.0  x 2.0 x 0.2 cm. Wound Base: Mixed Granular/Fibrotic Peri-wound: Normal Exudate: Scant/small amount Serous exudate Blood Loss during debridement: 0 cc('s). Material in wound which inhibits healing/promotes adjacent tissue breakdown:  necrotic tissue. Description of tissue removed from ulceration today:  necrotic tissue. Sign(s) of clinical bacterial infection: no clinical signs of infection noted on examination today.  Musculoskeletal Examination: Normal muscle strength 5/5 to all lower extremity muscle groups bilaterally. Patient ambulates independent of any assistive aids.  Neurological Examination: Protective sensation intact 5/5 intact bilaterally with 10g monofilament b/l. Vibratory sensation intact b/l.  Radiographs:  no gas in tissues, contracted digits 2-5 bilaterally and no bone erosion noted at location of ulceration  Assessment:  1. Ulcer of left foot with fat layer  exposed ([place]) - DG Foot Complete Left; Future  2. Plantar flexed metatarsal, unspecified laterality  3. Rheumatoid arthritis of left foot, unspecified whether rheumatoid factor present (HCC) - DG Foot Complete Left; Future    Plan:  -Patient was evaluated and treated and all questions answered.  -Patient/POA/Family member educated on diagnosis and treatment plan of routine ulcer debridement/wound care.  -Ulceration debridement achieved utilizing sharp excisional debridement with sterile scalpel blade.. Type/amount of devitalized tissue removed: necrotic tissue. -Today's ulcer size post-debridement: 3.0 x 2.0 x 0.2 cm. Clinically, her foot does not appear infected. She has no fever, chills, night sweats, nausea or vomiting. Will not prescribe antibiotics today. -Ulceration cleansed with wound cleanser. Iodosorb Gel applied to base of ulceration and secured with light dressing. -Xrays left foot complete series taken and reviewed in office with Mrs. Hubbard. Advised her due to chronicity of wound, an MRI may be needed in the future to rule out osteomyelitis if she continues to break down. -Wound responded well to today's debridement. -Surgical shoe was dispensed for left foot. -Applied PegAssist insert to surgical shoe to offload ulcer. Patient to wear daily.  -Patient risk factors affecting healing of ulcer: foot deformity, immunosuppressive medications, recurrence of wound, RA -[name] given written instructions on daily wound care for submet head 3 left foot ulceration. -Patient to report any pedal injuries to medical professional immediately. -Patient to continue soft, supportive shoe gear daily. -Patient/POA to call should there be question/concern in the interim.  Return in about 1 week (around 08/08/2020) for with Dr. Sherryle Lis for ulcer left foot.  Marzetta Board, DPM

## 2020-08-07 ENCOUNTER — Other Ambulatory Visit: Payer: Self-pay

## 2020-08-07 ENCOUNTER — Ambulatory Visit (INDEPENDENT_AMBULATORY_CARE_PROVIDER_SITE_OTHER): Payer: Medicare Other | Admitting: Podiatry

## 2020-08-07 DIAGNOSIS — L97522 Non-pressure chronic ulcer of other part of left foot with fat layer exposed: Secondary | ICD-10-CM | POA: Diagnosis not present

## 2020-08-07 DIAGNOSIS — M216X9 Other acquired deformities of unspecified foot: Secondary | ICD-10-CM | POA: Diagnosis not present

## 2020-08-07 DIAGNOSIS — M2042 Other hammer toe(s) (acquired), left foot: Secondary | ICD-10-CM

## 2020-08-07 DIAGNOSIS — M069 Rheumatoid arthritis, unspecified: Secondary | ICD-10-CM

## 2020-08-09 ENCOUNTER — Other Ambulatory Visit: Payer: Self-pay | Admitting: Podiatry

## 2020-08-09 DIAGNOSIS — L97521 Non-pressure chronic ulcer of other part of left foot limited to breakdown of skin: Secondary | ICD-10-CM

## 2020-08-12 NOTE — Progress Notes (Signed)
  Subjective:  Patient ID: VENESHA PETRAITIS, female    DOB: May 16, 1934,  MRN: [id number]  Chief Complaint  Patient presents with  . Wound Check    PT stated that she has no concerns and she is doing good   86 y.o. female returns with the above complaint. History confirmed with patient.  The previously healed callus returned as an ulceration again.  Objective:  Physical Exam: warm, good capillary refill, no trophic changes or ulcerative lesions, normal DP and PT pulses and normal sensory exam. Left Foot: Partial thickness ulceration at site of prior callus. Measures approximately 1.0cm x 0.8cm x 0.1 cm.  Radiographs: X-ray of the left foot: X-ray of the left foot: Moderate arthritis of the first metatarsophalangeal joint, severe arthritis with destruction of the metatarsophalangeal joints of the second through fifth MTPJ's pes planus foot type Assessment:   No diagnosis found.   Plan:  Patient was evaluated and treated and all questions answered.     Ulcer left foot -Debridement as below. -Dressed with iodosorb, DSD. Continue this at home. -Continue off-loading with surgical shoe with peg assist device. -She remains at high risk of delayed wound healing due to her RA   Procedure: Excisional Debridement of Wound Rationale: Removal of non-viable soft tissue from the wound to promote healing.  Anesthesia: none Pre-Debridement Wound Measurements: 1.0 cm x 0.8 cm x 0.1 cm  Post-Debridement Wound Measurements: same as pre debridement  Type of Debridement: Sharp Excisional Tissue Removed: Non-viable soft tissue Depth of Debridement: subcutaneous tissue. Technique: Sharp excisional debridement to bleeding, viable wound base.  Dressing: Dry, sterile, compression dressing. Disposition: Patient tolerated procedure well. Patient to return in 1 week for follow-up.   -Continue offloading and non-surgical management of her rheumatoid foot deformities.  Return in about 2 weeks (around  08/21/2020).

## 2020-08-21 ENCOUNTER — Ambulatory Visit (INDEPENDENT_AMBULATORY_CARE_PROVIDER_SITE_OTHER): Payer: Medicare Other | Admitting: Podiatry

## 2020-08-21 ENCOUNTER — Other Ambulatory Visit: Payer: Self-pay

## 2020-08-21 ENCOUNTER — Encounter: Payer: Self-pay | Admitting: Podiatry

## 2020-08-21 DIAGNOSIS — M069 Rheumatoid arthritis, unspecified: Secondary | ICD-10-CM | POA: Diagnosis not present

## 2020-08-21 DIAGNOSIS — L97522 Non-pressure chronic ulcer of other part of left foot with fat layer exposed: Secondary | ICD-10-CM

## 2020-08-21 DIAGNOSIS — M216X9 Other acquired deformities of unspecified foot: Secondary | ICD-10-CM

## 2020-08-21 DIAGNOSIS — M2042 Other hammer toe(s) (acquired), left foot: Secondary | ICD-10-CM

## 2020-08-21 NOTE — Progress Notes (Signed)
  Subjective:  Patient ID: [name], female    DOB: Dec 05, 1933,  MRN: [id number]  Chief Complaint  Patient presents with  . Wound Check    PT stated that she is doing good she denies any pain and has no concerns    86 y.o. female returns with the above complaint. History confirmed with patient.  The wound is still open.  She has been wearing the shoe with the peg assist offloading device.  Objective:  Physical Exam: warm, good capillary refill, no trophic changes or ulcerative lesions, normal DP and PT pulses and normal sensory exam. Left Foot: Partial thickness ulceration at site of prior callus.  Much improved since last visit.  Measures 2 mm x 1 mm after debridement.  Radiographs: X-ray of the left foot: X-ray of the left foot: Moderate arthritis of the first metatarsophalangeal joint, severe arthritis with destruction of the metatarsophalangeal joints of the second through fifth MTPJ's pes planus foot type Assessment:   No diagnosis found.   Plan:  Patient was evaluated and treated and all questions answered.     Ulcer left foot -Debridement as below. -Dressed with iodosorb, DSD. Continue this at home. -Continue off-loading with surgical shoe with peg assist device. -Hopefully this will heal the next 2 weeks -She remains at high risk of delayed wound healing due to her RA   Procedure: Excisional Debridement of Wound Rationale: Removal of non-viable soft tissue from the wound to promote healing.  Anesthesia: none Pre-Debridement Wound Measurements: 0.2 x 0.1 x 0.1 cm Post-Debridement Wound Measurements: same as pre debridement  Type of Debridement: Sharp Excisional Tissue Removed: Non-viable soft tissue Depth of Debridement: subcutaneous tissue. Technique: Sharp excisional debridement to bleeding, viable wound base.  Dressing: Dry, sterile, compression dressing. Disposition: Patient tolerated procedure well. Patient to return in 1 week for follow-up.   -Continue  offloading and non-surgical management of her rheumatoid foot deformities.  No follow-ups on file.

## 2020-09-03 ENCOUNTER — Ambulatory Visit: Payer: Medicare Other | Admitting: Podiatry

## 2020-09-04 ENCOUNTER — Ambulatory Visit (INDEPENDENT_AMBULATORY_CARE_PROVIDER_SITE_OTHER): Payer: Medicare Other | Admitting: Podiatry

## 2020-09-04 ENCOUNTER — Other Ambulatory Visit: Payer: Self-pay | Admitting: Family Medicine

## 2020-09-04 ENCOUNTER — Other Ambulatory Visit: Payer: Self-pay

## 2020-09-04 DIAGNOSIS — M069 Rheumatoid arthritis, unspecified: Secondary | ICD-10-CM | POA: Diagnosis not present

## 2020-09-04 DIAGNOSIS — M216X9 Other acquired deformities of unspecified foot: Secondary | ICD-10-CM

## 2020-09-04 DIAGNOSIS — M2042 Other hammer toe(s) (acquired), left foot: Secondary | ICD-10-CM

## 2020-09-04 DIAGNOSIS — Z872 Personal history of diseases of the skin and subcutaneous tissue: Secondary | ICD-10-CM | POA: Diagnosis not present

## 2020-09-04 DIAGNOSIS — Z1231 Encounter for screening mammogram for malignant neoplasm of breast: Secondary | ICD-10-CM

## 2020-09-05 ENCOUNTER — Encounter: Payer: Self-pay | Admitting: Podiatry

## 2020-09-05 NOTE — Progress Notes (Signed)
  Subjective:  Patient ID: [name], female    DOB: 1933-12-02,  MRN: [id number]  Chief Complaint  Patient presents with  . Wound Check    PT stated that she is doing good she does have the occasional pain with that spot    86 y.o. female returns with the above complaint. History confirmed with patient.  She feels well and has minimal pain she has been wearing the shoe with the peg assist offloading device.  Objective:  Physical Exam: warm, good capillary refill, no trophic changes or ulcerative lesions, normal DP and PT pulses and normal sensory exam. Left Foot: Ulceration has healed completely with mild hyperkeratosis  Radiographs: X-ray of the left foot: X-ray of the left foot: Moderate arthritis of the first metatarsophalangeal joint, severe arthritis with destruction of the metatarsophalangeal joints of the second through fifth MTPJ's pes planus foot type Assessment:   No diagnosis found.   Plan:  Patient was evaluated and treated and all questions answered.     Ulcer left foot -Ulcer has healed, I debrided the overlying callus today -I recommend that she continue to wear the peg assist in offloading shoe for 1 more week, if the wound remains healed then she may return to her regular shoe gear using a and offloading arch pad which I gave her today -Resume regular bathing and applying lotion to the callus -Continue offloading and non-surgical management of her rheumatoid foot deformities. -She has a visit scheduled Dr. Elisha Ponder in 2 weeks, I will release her back to her regular care.  If she has recurrence of the ulcer or is interested in surgical correction of the deformities I would be happy to see her again as needed  Return in about 2 weeks (around 09/18/2020).

## 2020-09-18 ENCOUNTER — Ambulatory Visit (INDEPENDENT_AMBULATORY_CARE_PROVIDER_SITE_OTHER): Payer: Medicare Other | Admitting: Podiatry

## 2020-09-18 ENCOUNTER — Other Ambulatory Visit: Payer: Self-pay

## 2020-09-18 ENCOUNTER — Encounter: Payer: Self-pay | Admitting: Podiatry

## 2020-09-18 DIAGNOSIS — M216X9 Other acquired deformities of unspecified foot: Secondary | ICD-10-CM | POA: Diagnosis not present

## 2020-09-18 DIAGNOSIS — Z872 Personal history of diseases of the skin and subcutaneous tissue: Secondary | ICD-10-CM | POA: Diagnosis not present

## 2020-09-18 DIAGNOSIS — M05772 Rheumatoid arthritis with rheumatoid factor of left ankle and foot without organ or systems involvement: Secondary | ICD-10-CM | POA: Diagnosis not present

## 2020-09-18 DIAGNOSIS — M05771 Rheumatoid arthritis with rheumatoid factor of right ankle and foot without organ or systems involvement: Secondary | ICD-10-CM

## 2020-09-20 DIAGNOSIS — M0579 Rheumatoid arthritis with rheumatoid factor of multiple sites without organ or systems involvement: Secondary | ICD-10-CM | POA: Diagnosis not present

## 2020-09-20 DIAGNOSIS — Z6823 Body mass index (BMI) 23.0-23.9, adult: Secondary | ICD-10-CM | POA: Diagnosis not present

## 2020-09-20 DIAGNOSIS — M150 Primary generalized (osteo)arthritis: Secondary | ICD-10-CM | POA: Diagnosis not present

## 2020-09-20 DIAGNOSIS — M2550 Pain in unspecified joint: Secondary | ICD-10-CM | POA: Diagnosis not present

## 2020-09-20 DIAGNOSIS — Z79899 Other long term (current) drug therapy: Secondary | ICD-10-CM | POA: Diagnosis not present

## 2020-09-21 DIAGNOSIS — K219 Gastro-esophageal reflux disease without esophagitis: Secondary | ICD-10-CM | POA: Diagnosis not present

## 2020-09-21 DIAGNOSIS — Z8489 Family history of other specified conditions: Secondary | ICD-10-CM | POA: Diagnosis not present

## 2020-09-21 DIAGNOSIS — Z923 Personal history of irradiation: Secondary | ICD-10-CM | POA: Diagnosis not present

## 2020-09-21 DIAGNOSIS — M810 Age-related osteoporosis without current pathological fracture: Secondary | ICD-10-CM | POA: Diagnosis not present

## 2020-09-23 NOTE — Progress Notes (Signed)
Subjective:  Patient ID: [name], female    DOB: 06/03/1934,  MRN: [id number]  [name] is seen for follow up ulceration plantar aspect left foot. She has seen Dr. Sherryle Lis for the past several visits with noted improvement/healing.  She has been wearing her Darco Shoe with peg-assist for offloading of ulcer.  She denies any fever, chills, night sweats, nausea or vomiting.  Past Medical History:  Diagnosis Date  . Abnormal cardiovascular stress test 12/2013   s/p cath with mild nonobstructive CAD normal EF - managed medically  . Breast cancer ([place]) 2008   RT, chemo, lumpectomy  . Diverticulitis   . DVT (deep vein thrombosis) in pregnancy    site of PICC catheter  . HTN (hypertension)   . Hyperlipidemia   . Hypothyroidism   . Kidney tumor 2010   RFA  . Personal history of chemotherapy   . Personal history of radiation therapy   . Prediabetes   . Rheumatoid arthritis(714.0)   . UTI (lower urinary tract infection)      Past Surgical History:  Procedure Laterality Date  . ABDOMINAL SURGERY     for ruptured diverticuli  . bilateral foot surgery    . BREAST LUMPECTOMY Left   . CARDIAC CATHETERIZATION    . CERVICAL SPINE SURGERY    . LEFT HEART CATHETERIZATION WITH CORONARY ANGIOGRAM N/A 01/17/2014   Procedure: LEFT HEART CATHETERIZATION WITH CORONARY ANGIOGRAM;  Surgeon: Peter M Martinique, MD;  Location: [facility] CATH LAB;  Service: Cardiovascular;  Laterality: N/A;  . OVARIAN CYST REMOVAL    . perforation of colon    . renal RFA    . take down of colonostomy  2011  . TONSILLECTOMY       Current Outpatient Medications on File Prior to Visit  Medication Sig Dispense Refill  . acetaminophen-codeine (TYLENOL #3) 300-30 MG per tablet Take 1 tablet by mouth every 4 (four) hours as needed for moderate pain.     Marland Kitchen ALPRAZolam (XANAX) 0.25 MG tablet Take 0.125 mg by mouth daily as needed for anxiety.     Marland Kitchen amLODipine (NORVASC) 2.5 MG tablet     . betamethasone dipropionate  (DIPROLENE) 0.05 % cream     . Calcium Citrate-Vitamin D (CITRACAL + D PO) Take 1 tablet by mouth daily.    . cholecalciferol (VITAMIN D) 1000 UNITS tablet Take 1,000 Units by mouth daily.    . cyclobenzaprine (FLEXERIL) 10 MG tablet TK 1/2 TO 1 T PO Q 8 H PRF MUSCLE SPASM    . denosumab (PROLIA) 60 MG/ML SOLN injection Inject 60 mg into the skin every 6 (six) months. Administer in upper arm, thigh, or abdomen    . dicyclomine (BENTYL) 10 MG capsule Take 10 mg by mouth daily as needed for spasms.     . fish oil-omega-3 fatty acids 1000 MG capsule Take 1,200 mg by mouth daily.     . folic acid (FOLVITE) 1 MG tablet Take 2 mg by mouth daily.     . hydrocortisone 2.5 % cream hydrocortisone 2.5 % topical cream    . levothyroxine (SYNTHROID, LEVOTHROID) 150 MCG tablet Take 150 mcg by mouth daily before breakfast.    . loratadine (CLARITIN) 10 MG tablet Take by mouth.    . Melatonin 2.5 MG CAPS Take 2.5 mg by mouth at bedtime as needed.     . methotrexate (50 MG/ML) 1 G injection Inject 0.8 mg into the vein once a week. Sunday    . Methotrexate Sodium (  METHOTREXATE, PF,) 50 MG/2ML injection     . Multiple Vitamin (MULTIVITAMIN) capsule Take 1 capsule by mouth daily.    . mupirocin ointment (BACTROBAN) 2 % Apply to left foot once daily. 22 g 0  . naproxen (NAPRELAN) 500 MG 24 hr tablet Take 500 mg by mouth daily with breakfast.     . naproxen (NAPROSYN) 500 MG tablet naproxen 500 mg tablet    . neomycin-polymyxin-dexameth (MAXITROL) 0.1 % OINT neomycin 3.5 mg/g-polymyxin B 10,000 unit/g-dexameth 0.1 % eye oint  APPLY TO LIDS TWICE A DAY FOR 10 DAYS    . Polyethylene Glycol 3350 (MIRALAX PO) Take by mouth See admin instructions. 3/4 a capful once daily    . potassium chloride (K-DUR) 10 MEQ tablet Take by mouth.    . potassium chloride SA (K-DUR,KLOR-CON) 20 MEQ tablet Take 20 mEq by mouth daily.    . predniSONE (DELTASONE) 5 MG tablet prednisone 5 mg tablets in a dose pack  Take 1 dose pk by oral  route as directed for 6 days.    . ranitidine (ZANTAC) 75 MG tablet Take 75 mg by mouth as needed for heartburn.    . simvastatin (ZOCOR) 20 MG tablet Take 20 mg by mouth every evening.    . simvastatin (ZOCOR) 40 MG tablet     . triamterene-hydrochlorothiazide (MAXZIDE-25) 37.5-25 MG per tablet Take 1 tablet by mouth daily.    . vitamin B-12 (CYANOCOBALAMIN) 500 MCG tablet Take by mouth.     No current facility-administered medications on file prior to visit.     Allergies  Allergen Reactions  . Dilaudid [Hydromorphone Hcl] Nausea And Vomiting  . Alendronate Other (See Comments)  . Morphine And Related   . Phenergan [Promethazine Hcl]   . Tamiflu [Oseltamivir Phosphate]     rash  . Taxol [Paclitaxel]     rash  . Tetanus Immune Globulin   . Tetanus Toxoids   . Tramadol Other (See Comments)  . Oseltamivir Rash     Family History  Problem Relation Age of Onset  . Heart disease Mother   . Cancer Mother        stomach  . Breast cancer Mother   . Heart disease Father   . Breast cancer Daughter      Social History   Occupational History  . Not on file  Tobacco Use  . Smoking status: Never Smoker  . Smokeless tobacco: Never Used  Substance and Sexual Activity  . Alcohol use: No  . Drug use: Not on file  . Sexual activity: Not on file     Immunization History  Administered Date(s) Administered  . Influenza Split 07/20/2013, 06/27/2014  . PFIZER SARS-COV-2 Vaccination 11/17/2019, 12/14/2019  . Pneumococcal Polysaccharide-23 02/18/2007     Review of systems: wound plantar left foot  Objective:  Physical Exam: There were no vitals filed for this visit.   86 y.o. Caucasian female WD, WN in NAD. AAO x 3.  Vascular Examination: Capillary refill time to digits immediate b/l. Palpable DP pulses b/l. Palpable PT pulses b/l. Pedal hair present b/l. Skin temperature gradient within normal limits b/l. No edema noted b/l.  Dermatological Examination: Pedal skin with  normal turgor, texture and tone bilaterally. No open wounds bilaterally. No interdigital macerations bilaterally.   Wound Location: submet head 2 left foot There is minimal hyperkeratosis present in the wound with subdermal hemorrhage. Predebridement Wound Measurement: 3.0 x 1.5 cm Postdebridement Wound Measurement: 3.0 x 1.5 cm Wound Base: epithelialized with visible subdermal  hemorrhage Peri-wound: Normal Exudate: None: wound tissue dry Blood Loss during debridement: 0 cc's. Description of tissue removed from ulceration today:  hyperkeratosis. Signs of clinical bacterial infection are absent. Material in wound which inhibits healing/promotes adjacent tissue breakdown: hyperkeratosis  Musculoskeletal Examination: Normal muscle strength 5/5 to all lower extremity muscle groups bilaterally. No pain crepitus or joint limitation noted with ROM b/l. Plantarflexed metatarsal(s) 2nd metatarsals left foot and right foot. Hallux valgus with bunion deformity noted b/l LE. Hammertoes noted to the b/l LE. She ambulates independent of any assistive aids.  Neurological Examination: Protective sensation intact 5/5 intact bilaterally with 10g monofilament b/l. Vibratory sensation intact b/l. Proprioception intact bilaterally.  Assessment:   1. Healed foot ulcer   2. Plantar flexed metatarsal, unspecified laterality   3. Rheumatoid arthritis involving both feet with positive rheumatoid factor (HCC)    Plan:  -Patient was evaluated and treated and all questions answered.  -Patient/POA/Family member educated on diagnosis and treatment plan of routine ulcer debridement/wound care.  -Type/amount of devitalized tissue removed: hemorrhagic hyperkeratosis -Today's ulcer size post-debridement: 3.. x 1.5 cm. Ulcer is completely healed. -Patient risk factors affecting healing of ulcer: foot deformity; long term use of immunosuppressive drugs for RA -Patient to continue soft, supportive shoe gear  daily. -Patient to report any pedal injuries to medical professional immediately. -Patient/POA to call should there be question/concern in the interim.  Return in about 4 weeks for follow up preulcerative callus.  Marzetta Board, DPM

## 2020-09-27 DIAGNOSIS — E559 Vitamin D deficiency, unspecified: Secondary | ICD-10-CM | POA: Diagnosis not present

## 2020-09-27 DIAGNOSIS — Z923 Personal history of irradiation: Secondary | ICD-10-CM | POA: Diagnosis not present

## 2020-09-27 DIAGNOSIS — F330 Major depressive disorder, recurrent, mild: Secondary | ICD-10-CM | POA: Diagnosis not present

## 2020-09-27 DIAGNOSIS — Z853 Personal history of malignant neoplasm of breast: Secondary | ICD-10-CM | POA: Diagnosis not present

## 2020-09-27 DIAGNOSIS — M810 Age-related osteoporosis without current pathological fracture: Secondary | ICD-10-CM | POA: Diagnosis not present

## 2020-09-27 DIAGNOSIS — Z9049 Acquired absence of other specified parts of digestive tract: Secondary | ICD-10-CM | POA: Diagnosis not present

## 2020-09-27 DIAGNOSIS — R7303 Prediabetes: Secondary | ICD-10-CM | POA: Diagnosis not present

## 2020-09-27 DIAGNOSIS — M5135 Other intervertebral disc degeneration, thoracolumbar region: Secondary | ICD-10-CM | POA: Diagnosis not present

## 2020-09-27 DIAGNOSIS — E039 Hypothyroidism, unspecified: Secondary | ICD-10-CM | POA: Diagnosis not present

## 2020-09-27 DIAGNOSIS — Z0000 Encounter for general adult medical examination without abnormal findings: Secondary | ICD-10-CM | POA: Diagnosis not present

## 2020-09-27 DIAGNOSIS — M0579 Rheumatoid arthritis with rheumatoid factor of multiple sites without organ or systems involvement: Secondary | ICD-10-CM | POA: Diagnosis not present

## 2020-09-27 DIAGNOSIS — I10 Essential (primary) hypertension: Secondary | ICD-10-CM | POA: Diagnosis not present

## 2020-09-27 DIAGNOSIS — Z131 Encounter for screening for diabetes mellitus: Secondary | ICD-10-CM | POA: Diagnosis not present

## 2020-10-04 DIAGNOSIS — M25551 Pain in right hip: Secondary | ICD-10-CM | POA: Insufficient documentation

## 2020-10-08 DIAGNOSIS — M5136 Other intervertebral disc degeneration, lumbar region: Secondary | ICD-10-CM | POA: Diagnosis not present

## 2020-10-08 DIAGNOSIS — M25551 Pain in right hip: Secondary | ICD-10-CM | POA: Diagnosis not present

## 2020-10-19 ENCOUNTER — Ambulatory Visit: Payer: Medicare Other

## 2020-11-01 ENCOUNTER — Other Ambulatory Visit: Payer: Self-pay

## 2020-11-01 ENCOUNTER — Ambulatory Visit (INDEPENDENT_AMBULATORY_CARE_PROVIDER_SITE_OTHER): Payer: Medicare Other | Admitting: Podiatry

## 2020-11-01 DIAGNOSIS — M2042 Other hammer toe(s) (acquired), left foot: Secondary | ICD-10-CM

## 2020-11-01 DIAGNOSIS — M216X9 Other acquired deformities of unspecified foot: Secondary | ICD-10-CM | POA: Diagnosis not present

## 2020-11-01 DIAGNOSIS — L97521 Non-pressure chronic ulcer of other part of left foot limited to breakdown of skin: Secondary | ICD-10-CM

## 2020-11-01 DIAGNOSIS — M05772 Rheumatoid arthritis with rheumatoid factor of left ankle and foot without organ or systems involvement: Secondary | ICD-10-CM | POA: Diagnosis not present

## 2020-11-01 DIAGNOSIS — M05771 Rheumatoid arthritis with rheumatoid factor of right ankle and foot without organ or systems involvement: Secondary | ICD-10-CM

## 2020-11-03 ENCOUNTER — Encounter: Payer: Self-pay | Admitting: Podiatry

## 2020-11-03 NOTE — Progress Notes (Signed)
  Subjective:  Patient ID: [name], female    DOB: Jan 17, 1934,  MRN: [id number]  Chief Complaint  Patient presents with  . Callouses    Left foot callus in the center of her foot. PT stated that it is very painful and causing pain in her toes.   87 y.o. female returns with the above complaint. History confirmed with patient. Here for urgent visit today. She think she has recurrence of the ulcer  Objective:  Physical Exam: warm, good capillary refill, no trophic changes or ulcerative lesions, normal DP and PT pulses and normal sensory exam. Left Foot: Hyperkeratosis returned with underlying ulceration, measures 5 mm x 3 mm x 0.1, no signs of infection limited to breakdown of skin  Radiographs: X-ray of the left foot: X-ray of the left foot: Moderate arthritis of the first metatarsophalangeal joint, severe arthritis with destruction of the metatarsophalangeal joints of the second through fifth MTPJ's pes planus foot type Assessment:   1. Skin ulcer of left foot, limited to breakdown of skin ([place])   2. Plantar flexed metatarsal, unspecified laterality   3. Rheumatoid arthritis involving both feet with positive rheumatoid factor (HCC)   4. Hammertoe of left foot      Plan:  Patient was evaluated and treated and all questions answered.    Ulcer left foot -Debridement as below. -Dressed with Iodosorb, DSD. -She has been wearing the surgical shoe with a peg assist she should continue to wear this until it is healed. I would like to reevaluate her and 3 weeks prior to her next appointment Dr. Elisha Ponder  Procedure: Excisional Debridement of Wound Rationale: Removal of non-viable soft tissue from the wound to promote healing.  Anesthesia: none Pre-Debridement Wound Measurements: Unable to measure due to hyperkeratosis Post-Debridement Wound Measurements:0.5 cm x 0.3 cm x 0.1 cm  Type of Debridement: Sharp Excisional Tissue Removed: Non-viable soft tissue Depth of Debridement:  subcutaneous tissue. Technique: Sharp excisional debridement to bleeding, viable wound base.  Dressing: Dry, sterile, compression dressing. Disposition: Patient tolerated procedure well.  Return in about 3 weeks (around 11/22/2020) for wound re-check.

## 2020-11-08 DIAGNOSIS — L570 Actinic keratosis: Secondary | ICD-10-CM | POA: Diagnosis not present

## 2020-11-08 DIAGNOSIS — Z85828 Personal history of other malignant neoplasm of skin: Secondary | ICD-10-CM | POA: Diagnosis not present

## 2020-11-08 DIAGNOSIS — L0211 Cutaneous abscess of neck: Secondary | ICD-10-CM | POA: Diagnosis not present

## 2020-11-08 DIAGNOSIS — L820 Inflamed seborrheic keratosis: Secondary | ICD-10-CM | POA: Diagnosis not present

## 2020-11-08 DIAGNOSIS — L0889 Other specified local infections of the skin and subcutaneous tissue: Secondary | ICD-10-CM | POA: Diagnosis not present

## 2020-11-14 DIAGNOSIS — M810 Age-related osteoporosis without current pathological fracture: Secondary | ICD-10-CM | POA: Diagnosis not present

## 2020-11-15 ENCOUNTER — Ambulatory Visit
Admission: RE | Admit: 2020-11-15 | Discharge: 2020-11-15 | Disposition: A | Discharge status: Home / Self Care | Payer: Medicare Other | Source: Ambulatory Visit | Attending: Family Medicine | Admitting: Family Medicine

## 2020-11-15 ENCOUNTER — Other Ambulatory Visit: Payer: Self-pay

## 2020-11-15 DIAGNOSIS — Z1231 Encounter for screening mammogram for malignant neoplasm of breast: Secondary | ICD-10-CM

## 2020-11-22 ENCOUNTER — Ambulatory Visit (INDEPENDENT_AMBULATORY_CARE_PROVIDER_SITE_OTHER): Payer: Medicare Other | Admitting: Podiatry

## 2020-11-22 ENCOUNTER — Other Ambulatory Visit: Payer: Self-pay

## 2020-11-22 ENCOUNTER — Encounter: Payer: Self-pay | Admitting: Podiatry

## 2020-11-22 DIAGNOSIS — L84 Corns and callosities: Secondary | ICD-10-CM

## 2020-11-22 DIAGNOSIS — Z872 Personal history of diseases of the skin and subcutaneous tissue: Secondary | ICD-10-CM

## 2020-11-22 DIAGNOSIS — M79671 Pain in right foot: Secondary | ICD-10-CM

## 2020-11-22 DIAGNOSIS — M79672 Pain in left foot: Secondary | ICD-10-CM | POA: Diagnosis not present

## 2020-11-22 DIAGNOSIS — L97521 Non-pressure chronic ulcer of other part of left foot limited to breakdown of skin: Secondary | ICD-10-CM | POA: Diagnosis not present

## 2020-11-22 NOTE — Progress Notes (Signed)
  Subjective:  Patient ID: [name], female    DOB: 03/13/1934,  MRN: [id number]  Chief Complaint  Patient presents with  . Foot Ulcer    PT stated that she is doing well she has no major concerns at this time   87 y.o. female returns with the above complaint. History confirmed with patient.  Is feeling better.  She has had little to no drainage  Objective:  Physical Exam: warm, good capillary refill, no trophic changes or ulcerative lesions, normal DP and PT pulses and normal sensory exam. Left Foot: Hyperkeratosis improved with skin intact underneath, post debridement there is no ulceration Right foot diffuse hyperkeratosis submetatarsal 2, 3  Radiographs: X-ray of the left foot: X-ray of the left foot: Moderate arthritis of the first metatarsophalangeal joint, severe arthritis with destruction of the metatarsophalangeal joints of the second through fifth MTPJ's pes planus foot type Assessment:   No diagnosis found.   Plan:  Patient was evaluated and treated and all questions answered.    Ulcer left foot -Wound has healed -Apply bandage padding, she can do this as needed -Continue use of surgical shoe and peg assist device as needed -Calluses debrided of all hyperkeratosis today  No follow-ups on file.

## 2020-11-23 ENCOUNTER — Telehealth: Payer: Self-pay | Admitting: Podiatry

## 2020-11-23 NOTE — Telephone Encounter (Signed)
Patient left a message saying the shoe she was given is way too big. I left a message telling her to come by and get a different size shoe

## 2020-11-26 ENCOUNTER — Telehealth: Payer: Self-pay | Admitting: Podiatry

## 2020-11-26 NOTE — Telephone Encounter (Signed)
Called pt to see if she could bring back her old shoe in exchange for a smaller size. She will be in some time today. The front staff is aware.

## 2020-11-26 NOTE — Telephone Encounter (Signed)
OK - thanks

## 2020-12-11 ENCOUNTER — Other Ambulatory Visit: Payer: Self-pay

## 2020-12-11 ENCOUNTER — Ambulatory Visit (INDEPENDENT_AMBULATORY_CARE_PROVIDER_SITE_OTHER): Payer: Medicare Other | Admitting: Podiatry

## 2020-12-11 ENCOUNTER — Encounter: Payer: Self-pay | Admitting: Podiatry

## 2020-12-11 DIAGNOSIS — M05772 Rheumatoid arthritis with rheumatoid factor of left ankle and foot without organ or systems involvement: Secondary | ICD-10-CM

## 2020-12-11 DIAGNOSIS — L97521 Non-pressure chronic ulcer of other part of left foot limited to breakdown of skin: Secondary | ICD-10-CM | POA: Diagnosis not present

## 2020-12-11 DIAGNOSIS — M216X9 Other acquired deformities of unspecified foot: Secondary | ICD-10-CM

## 2020-12-11 DIAGNOSIS — M05771 Rheumatoid arthritis with rheumatoid factor of right ankle and foot without organ or systems involvement: Secondary | ICD-10-CM

## 2020-12-16 NOTE — Progress Notes (Signed)
  Subjective:  Patient ID: [name], female    DOB: 1934/07/11,  MRN: [id number]  [name] is seen for follow up ulceration plantar aspect left foot. She has seen Dr. Sherryle Lis for the past several visits with noted improvement/healing.  She has been wearing her Darco Shoe with peg-assist for offloading of ulcer.  She denies any fever, chills, night sweats, nausea or vomiting.  Allergies  Allergen Reactions  . Dilaudid [Hydromorphone Hcl] Nausea And Vomiting  . Alendronate Other (See Comments)  . Azithromycin     Other reaction(s): made her feel bad  . Morphine And Related   . Parathyroid Hormone ([place])     Other reaction(s): (radiation therapy for breast cancer)  . Phenergan [Promethazine Hcl]   . Tamiflu [Oseltamivir Phosphate]     rash  . Taxol [Paclitaxel]     rash  . Tetanus Immune Globulin   . Tetanus Toxoids   . Tramadol Other (See Comments)  . Oseltamivir Rash    Objective:  Physical Exam: There were no vitals filed for this visit.   87 y.o. Caucasian female WD, WN in NAD. AAO x 3.  Vascular Examination: Capillary refill time to digits immediate b/l. Palpable DP pulses b/l. Palpable PT pulses b/l. Pedal hair present b/l. Skin temperature gradient within normal limits b/l. No edema noted b/l.  Dermatological Examination: Pedal skin with normal turgor, texture and tone bilaterally. No open wounds bilaterally. No interdigital macerations bilaterally.   Wound Location: submet head 2 left foot There is minimal hyperkeratosis present in the wound with subdermal hemorrhage. Predebridement Wound Measurement: 3.0 x 1.5 cm Postdebridement Wound Measurement: 3.0 x 1.5 cm Wound Base: epithelialized with visible subdermal hemorrhage Peri-wound: Normal Exudate: None: wound tissue dry Blood Loss during debridement: 0 cc's. Description of tissue removed from ulceration today:  hyperkeratosis. Signs of clinical bacterial infection are absent. Material in wound which  inhibits healing/promotes adjacent tissue breakdown: hyperkeratosis  Musculoskeletal Examination: Normal muscle strength 5/5 to all lower extremity muscle groups bilaterally. No pain crepitus or joint limitation noted with ROM b/l. Plantarflexed metatarsal(s) 2nd metatarsals left foot and right foot. Hallux valgus with bunion deformity noted b/l LE. Hammertoes noted to the b/l LE. She ambulates independent of any assistive aids.  Neurological Examination: Protective sensation intact 5/5 intact bilaterally with 10g monofilament b/l. Vibratory sensation intact b/l. Proprioception intact bilaterally.  Assessment:   1. Skin ulcer of left foot, limited to breakdown of skin ([place])   2. Plantar flexed metatarsal, unspecified laterality   3. Rheumatoid arthritis involving both feet with positive rheumatoid factor (HCC)    Plan:  -Patient was evaluated and treated and all questions answered.  -Patient/POA/Family member educated on diagnosis and treatment plan of routine ulcer debridement/wound care.  -Type/amount of devitalized tissue removed: hemorrhagic hyperkeratosis -Today's ulcer size post-debridement: 3.0 x 1.5 x 0.1 cm. Antibiotic ointment and band-aid applied. -Patient risk factors affecting healing of ulcer: recurrence of foot ulcer,  foot deformity; long term use of immunosuppressive drugs for RA -She is to wear Darco shoe when at home daily. -Patient to report any pedal injuries to medical professional immediately. -Dispensed hook on foam metatarsal pad for daily use. Apply every morning. Remove every evening.  Return in about 6 weeks for follow up with Dr. Sherryle Lis. Follow up in 12 weeks with me.  Marzetta Board, DPM

## 2020-12-20 DIAGNOSIS — M150 Primary generalized (osteo)arthritis: Secondary | ICD-10-CM | POA: Diagnosis not present

## 2020-12-20 DIAGNOSIS — Z6823 Body mass index (BMI) 23.0-23.9, adult: Secondary | ICD-10-CM | POA: Diagnosis not present

## 2020-12-20 DIAGNOSIS — Z79899 Other long term (current) drug therapy: Secondary | ICD-10-CM | POA: Diagnosis not present

## 2020-12-20 DIAGNOSIS — M2550 Pain in unspecified joint: Secondary | ICD-10-CM | POA: Diagnosis not present

## 2020-12-20 DIAGNOSIS — M0579 Rheumatoid arthritis with rheumatoid factor of multiple sites without organ or systems involvement: Secondary | ICD-10-CM | POA: Diagnosis not present

## 2021-01-22 ENCOUNTER — Ambulatory Visit (INDEPENDENT_AMBULATORY_CARE_PROVIDER_SITE_OTHER): Payer: Medicare Other | Admitting: Podiatry

## 2021-01-22 ENCOUNTER — Other Ambulatory Visit: Payer: Self-pay

## 2021-01-22 DIAGNOSIS — M05772 Rheumatoid arthritis with rheumatoid factor of left ankle and foot without organ or systems involvement: Secondary | ICD-10-CM | POA: Diagnosis not present

## 2021-01-22 DIAGNOSIS — M05771 Rheumatoid arthritis with rheumatoid factor of right ankle and foot without organ or systems involvement: Secondary | ICD-10-CM

## 2021-01-22 DIAGNOSIS — M216X9 Other acquired deformities of unspecified foot: Secondary | ICD-10-CM | POA: Diagnosis not present

## 2021-01-22 DIAGNOSIS — L84 Corns and callosities: Secondary | ICD-10-CM | POA: Diagnosis not present

## 2021-01-23 ENCOUNTER — Encounter: Payer: Self-pay | Admitting: Podiatry

## 2021-01-23 NOTE — Progress Notes (Signed)
  Subjective:  Patient ID: [name], female    DOB: 1934-03-04,  MRN: [id number]  Chief Complaint  Patient presents with  . Foot Ulcer       6wk f/u ulcer left foot   87 y.o. female returns with the above complaint. History confirmed with patient.  Doing well she has had no much drainage  Objective:  Physical Exam: warm, good capillary refill, no trophic changes or ulcerative lesions, normal DP and PT pulses and normal sensory exam. Left Foot: Hyperkeratosis improved with skin intact underneath, post debridement there is no ulceration Right foot diffuse hyperkeratosis submetatarsal 2, 3  Radiographs: X-ray of the left foot: X-ray of the left foot: Moderate arthritis of the first metatarsophalangeal joint, severe arthritis with destruction of the metatarsophalangeal joints of the second through fifth MTPJ's pes planus foot type Assessment:   1. Plantar flexed metatarsal, unspecified laterality   2. Rheumatoid arthritis involving both feet with positive rheumatoid factor (HCC)   3. Callus      Plan:  Patient was evaluated and treated and all questions answered.    Ulcer left foot -Wound remains healed -Continue use of surgical shoe and peg assist device as needed if it begins to recur, otherwise comfortable cushioned shoes -Calluses debrided of all hyperkeratosis today  No follow-ups on file.

## 2021-02-06 ENCOUNTER — Ambulatory Visit (INDEPENDENT_AMBULATORY_CARE_PROVIDER_SITE_OTHER): Payer: Medicare Other | Admitting: Podiatry

## 2021-02-06 ENCOUNTER — Other Ambulatory Visit: Payer: Self-pay

## 2021-02-06 DIAGNOSIS — S90425A Blister (nonthermal), left lesser toe(s), initial encounter: Secondary | ICD-10-CM | POA: Diagnosis not present

## 2021-02-06 MED ORDER — DOXYCYCLINE HYCLATE 100 MG PO TABS: 100.0000 mg | ORAL_TABLET | Freq: Two times a day (BID) | ORAL | 0 refills | Status: DC

## 2021-02-08 ENCOUNTER — Encounter: Payer: Self-pay | Admitting: Podiatry

## 2021-02-08 NOTE — Progress Notes (Signed)
Subjective:  Patient ID: [name], female    DOB: Mar 15, 1934,  MRN: [id number]  Chief Complaint  Patient presents with  . Foot Pain    (urgent work in) left foot chronic pain    87 y.o. female presents with the above complaint.  Patient presents with complaint of left second and third digit pain.  Patient states that it is painful to walk on has progressive gotten worse.  She was seen by Dr. Sherryle Lis for something in a different issue.  She states that it started blistering up is very mild but there is some redness associated with it.  She just wants to make sure is not infected.  She has not seen anyone else prior to seeing me.  She denies any other acute complaints.  She is a prediabetic   Review of Systems: Negative except as noted in the HPI. Denies N/V/F/Ch.  Past Medical History:  Diagnosis Date  . Abnormal cardiovascular stress test 12/2013   s/p cath with mild nonobstructive CAD normal EF - managed medically  . Breast cancer ([place]) 2008   RT, chemo, lumpectomy  . Diverticulitis   . DVT (deep vein thrombosis) in pregnancy    site of PICC catheter  . HTN (hypertension)   . Hyperlipidemia   . Hypothyroidism   . Kidney tumor 2010   RFA  . Personal history of chemotherapy   . Personal history of radiation therapy   . Prediabetes   . Rheumatoid arthritis(714.0)   . UTI (lower urinary tract infection)     Current Outpatient Medications:  .  doxycycline (VIBRA-TABS) 100 MG tablet, Take 1 tablet (100 mg total) by mouth 2 (two) times daily., Disp: 20 tablet, Rfl: 0 .  acetaminophen-codeine (TYLENOL #3) 300-30 MG per tablet, Take 1 tablet by mouth every 4 (four) hours as needed for moderate pain. , Disp: , Rfl:  .  ALPRAZolam (XANAX) 0.25 MG tablet, Take 0.125 mg by mouth daily as needed for anxiety. , Disp: , Rfl:  .  amLODipine (NORVASC) 2.5 MG tablet, , Disp: , Rfl:  .  betamethasone dipropionate (DIPROLENE) 0.05 % cream, , Disp: , Rfl:  .  Calcium Citrate-Vitamin D  (CITRACAL + D PO), Take 1 tablet by mouth daily., Disp: , Rfl:  .  cephALEXin (KEFLEX) 500 MG capsule, Take 500 mg by mouth 2 (two) times daily., Disp: , Rfl:  .  cholecalciferol (VITAMIN D) 1000 UNITS tablet, Take 1,000 Units by mouth daily., Disp: , Rfl:  .  clobetasol cream (TEMOVATE) 0.05 %, , Disp: , Rfl:  .  cyclobenzaprine (FLEXERIL) 10 MG tablet, TK 1/2 TO 1 T PO Q 8 H PRF MUSCLE SPASM, Disp: , Rfl:  .  denosumab (PROLIA) 60 MG/ML SOLN injection, Inject 60 mg into the skin every 6 (six) months. Administer in upper arm, thigh, or abdomen, Disp: , Rfl:  .  diclofenac Sodium (VOLTAREN) 1 % GEL, , Disp: , Rfl:  .  dicyclomine (BENTYL) 10 MG capsule, Take 10 mg by mouth daily as needed for spasms. , Disp: , Rfl:  .  fish oil-omega-3 fatty acids 1000 MG capsule, Take 1,200 mg by mouth daily. , Disp: , Rfl:  .  fluorouracil (EFUDEX) 5 % cream, Apply topically., Disp: , Rfl:  .  folic acid (FOLVITE) 1 MG tablet, Take 2 mg by mouth daily. , Disp: , Rfl:  .  hydrocortisone 2.5 % cream, hydrocortisone 2.5 % topical cream, Disp: , Rfl:  .  levothyroxine (SYNTHROID, LEVOTHROID) 150 MCG  tablet, Take 150 mcg by mouth daily before breakfast., Disp: , Rfl:  .  loratadine (CLARITIN) 10 MG tablet, Take by mouth., Disp: , Rfl:  .  Melatonin 2.5 MG CAPS, Take 2.5 mg by mouth at bedtime as needed. , Disp: , Rfl:  .  methotrexate (50 MG/ML) 1 G injection, Inject 0.8 mg into the vein once a week. Sunday, Disp: , Rfl:  .  Methotrexate Sodium (METHOTREXATE, PF,) 50 MG/2ML injection, , Disp: , Rfl:  .  Multiple Vitamin (MULTIVITAMIN) capsule, Take 1 capsule by mouth daily., Disp: , Rfl:  .  mupirocin ointment (BACTROBAN) 2 %, Apply to left foot once daily., Disp: 22 g, Rfl: 0 .  naproxen (NAPRELAN) 500 MG 24 hr tablet, Take 500 mg by mouth daily with breakfast. , Disp: , Rfl:  .  naproxen (NAPROSYN) 500 MG tablet, naproxen 500 mg tablet, Disp: , Rfl:  .  neomycin-polymyxin-dexameth (MAXITROL) 0.1 % OINT, neomycin  3.5 mg/g-polymyxin B 10,000 unit/g-dexameth 0.1 % eye oint  APPLY TO LIDS TWICE A DAY FOR 10 DAYS, Disp: , Rfl:  .  Polyethylene Glycol 3350 (MIRALAX PO), Take by mouth See admin instructions. 3/4 a capful once daily, Disp: , Rfl:  .  potassium chloride (K-DUR) 10 MEQ tablet, Take by mouth., Disp: , Rfl:  .  potassium chloride SA (K-DUR,KLOR-CON) 20 MEQ tablet, Take 20 mEq by mouth daily., Disp: , Rfl:  .  predniSONE (DELTASONE) 5 MG tablet, prednisone 5 mg tablets in a dose pack  Take 1 dose pk by oral route as directed for 6 days., Disp: , Rfl:  .  ranitidine (ZANTAC) 75 MG tablet, Take 75 mg by mouth as needed for heartburn., Disp: , Rfl:  .  simvastatin (ZOCOR) 20 MG tablet, Take 20 mg by mouth every evening., Disp: , Rfl:  .  simvastatin (ZOCOR) 40 MG tablet, , Disp: , Rfl:  .  sodium chloride (OCEAN NASAL SPRAY) 0.65 % nasal spray, , Disp: , Rfl:  .  triamterene-hydrochlorothiazide (MAXZIDE-25) 37.5-25 MG per tablet, Take 1 tablet by mouth daily., Disp: , Rfl:  .  vitamin B-12 (CYANOCOBALAMIN) 500 MCG tablet, Take by mouth., Disp: , Rfl:   Social History   Tobacco Use  Smoking Status Never Smoker  Smokeless Tobacco Never Used    Allergies  Allergen Reactions  . Dilaudid [Hydromorphone Hcl] Nausea And Vomiting  . Alendronate Other (See Comments)  . Azithromycin     Other reaction(s): made her feel bad  . Morphine And Related   . Parathyroid Hormone (Recomb)     Other reaction(s): (radiation therapy for breast cancer)  . Phenergan [Promethazine Hcl]   . Tamiflu [Oseltamivir Phosphate]     rash  . Taxol [Paclitaxel]     rash  . Tetanus Immune Globulin   . Tetanus Toxoids   . Tramadol Other (See Comments)  . Oseltamivir Rash   Objective:  There were no vitals filed for this visit. There is no height or weight on file to calculate BMI. Constitutional Well developed. Well nourished.  Vascular Dorsalis pedis pulses palpable bilaterally. Posterior tibial pulses palpable  bilaterally. Capillary refill normal to all digits.  No cyanosis or clubbing noted. Pedal hair growth normal.  Neurologic Normal speech. Oriented to person, place, and time. Epicritic sensation to light touch grossly present bilaterally.  Dermatologic  left second third digit blister ration very mild and superficial.  Mild erythema around the blister site.  No purulent drainage noted.  No other clinical signs of infection noted.  Orthopedic: Normal joint ROM without pain or crepitus bilaterally. No visible deformities. No bony tenderness.   Radiographs: None Assessment:   1. Blister of third toe of left foot, initial encounter   2. Blister of second toe of left foot, initial encounter    Plan:  Patient was evaluated and treated and all questions answered.  Left second and third digit blister mild -I explained to the patient the etiology of blister formation various treatment options were discussed.  Patient's blister is already deroofed and is very pink and granular underneath it.  I have asked him to keep it covered with some Neosporin and a Band-Aid.  Given that there is some redness around it she would benefit from doxycycline 10-day course. -Doxycycline was dispensed to pharmacy for skin and soft tissue prophylaxis.  I have asked her to complete the course.  She states understanding.  No follow-ups on file.

## 2021-02-14 DIAGNOSIS — M5136 Other intervertebral disc degeneration, lumbar region: Secondary | ICD-10-CM | POA: Diagnosis not present

## 2021-02-14 DIAGNOSIS — M5417 Radiculopathy, lumbosacral region: Secondary | ICD-10-CM | POA: Diagnosis not present

## 2021-03-06 ENCOUNTER — Ambulatory Visit (INDEPENDENT_AMBULATORY_CARE_PROVIDER_SITE_OTHER): Payer: Medicare Other | Admitting: Podiatry

## 2021-03-06 ENCOUNTER — Other Ambulatory Visit: Payer: Self-pay

## 2021-03-06 ENCOUNTER — Encounter: Payer: Self-pay | Admitting: Podiatry

## 2021-03-06 DIAGNOSIS — M216X9 Other acquired deformities of unspecified foot: Secondary | ICD-10-CM

## 2021-03-06 DIAGNOSIS — L84 Corns and callosities: Secondary | ICD-10-CM

## 2021-03-06 DIAGNOSIS — M05771 Rheumatoid arthritis with rheumatoid factor of right ankle and foot without organ or systems involvement: Secondary | ICD-10-CM

## 2021-03-06 DIAGNOSIS — M05772 Rheumatoid arthritis with rheumatoid factor of left ankle and foot without organ or systems involvement: Secondary | ICD-10-CM | POA: Diagnosis not present

## 2021-03-06 DIAGNOSIS — D3000 Benign neoplasm of unspecified kidney: Secondary | ICD-10-CM | POA: Insufficient documentation

## 2021-03-06 DIAGNOSIS — F419 Anxiety disorder, unspecified: Secondary | ICD-10-CM | POA: Insufficient documentation

## 2021-03-06 DIAGNOSIS — M5135 Other intervertebral disc degeneration, thoracolumbar region: Secondary | ICD-10-CM | POA: Insufficient documentation

## 2021-03-06 DIAGNOSIS — M419 Scoliosis, unspecified: Secondary | ICD-10-CM | POA: Insufficient documentation

## 2021-03-06 DIAGNOSIS — M810 Age-related osteoporosis without current pathological fracture: Secondary | ICD-10-CM | POA: Insufficient documentation

## 2021-03-06 DIAGNOSIS — K219 Gastro-esophageal reflux disease without esophagitis: Secondary | ICD-10-CM | POA: Insufficient documentation

## 2021-03-06 DIAGNOSIS — Z853 Personal history of malignant neoplasm of breast: Secondary | ICD-10-CM | POA: Insufficient documentation

## 2021-03-06 DIAGNOSIS — F330 Major depressive disorder, recurrent, mild: Secondary | ICD-10-CM | POA: Insufficient documentation

## 2021-03-06 DIAGNOSIS — I2510 Atherosclerotic heart disease of native coronary artery without angina pectoris: Secondary | ICD-10-CM | POA: Insufficient documentation

## 2021-03-06 DIAGNOSIS — R7303 Prediabetes: Secondary | ICD-10-CM | POA: Insufficient documentation

## 2021-03-06 DIAGNOSIS — E559 Vitamin D deficiency, unspecified: Secondary | ICD-10-CM | POA: Insufficient documentation

## 2021-03-06 DIAGNOSIS — Z9049 Acquired absence of other specified parts of digestive tract: Secondary | ICD-10-CM | POA: Insufficient documentation

## 2021-03-06 DIAGNOSIS — Z8781 Personal history of (healed) traumatic fracture: Secondary | ICD-10-CM | POA: Insufficient documentation

## 2021-03-06 DIAGNOSIS — D692 Other nonthrombocytopenic purpura: Secondary | ICD-10-CM | POA: Insufficient documentation

## 2021-03-06 DIAGNOSIS — H9190 Unspecified hearing loss, unspecified ear: Secondary | ICD-10-CM | POA: Insufficient documentation

## 2021-03-06 DIAGNOSIS — E7800 Pure hypercholesterolemia, unspecified: Secondary | ICD-10-CM | POA: Insufficient documentation

## 2021-03-06 DIAGNOSIS — Z923 Personal history of irradiation: Secondary | ICD-10-CM | POA: Insufficient documentation

## 2021-03-10 NOTE — Progress Notes (Signed)
  Subjective:  Patient ID: [name], female    DOB: 17-Sep-1934,  MRN: [id number]  [name] presents to clinic today for follow up chronic preulcerative lesions b/l LE left >right. She has h/o RA with severe structural pedal deformities which have resulted in skin breakdown of the left foot. We have been able to manage with offloaded Darco shoe with Peg Assist for her left foot. She also has felt padding for insole of her left shoe.  She states left foot is tender today.  Denies any redness, drainage or swelling from foot.   She states her husband has now been diagnosed with Parkinson's and still resides at [facility] at [place].  PCP is Dr. Jonathon Jordan and last visit was 09/27/2020.  Allergies  Allergen Reactions  . Dilaudid [Hydromorphone Hcl] Nausea And Vomiting  . Alendronate Other (See Comments)    Other reaction(s): Severe heartburn  . Azithromycin     Other reaction(s): made her feel bad  . Morphine And Related   . Parathyroid Hormone (Recomb)     Other reaction(s): (radiation therapy for breast cancer)  . Phenergan [Promethazine Hcl]   . Phenergan [Promethazine]     Other reaction(s): hallucinations  . Tamiflu [Oseltamivir Phosphate]     rash  . Taxol [Paclitaxel]     rash  . Tetanus Immune Globulin   . Tetanus Toxoid, Adsorbed     Other reaction(s): localized reaction  . Tetanus Toxoids   . Tramadol Other (See Comments)  . Oseltamivir Rash    Other reaction(s): Rash    Review of Systems: Negative except as noted in the HPI. Objective:   Constitutional [name] is a pleasant 87 y.o. Caucasian female, WD, WN in NAD. AAO x 3.   Vascular Capillary refill time to digits immediate b/l. Palpable pedal pulses b/l LE. Pedal hair present. Lower extremity skin temperature gradient within normal limits. No pain with calf compression b/l. No cyanosis or clubbing noted.  Neurologic Normal speech. Oriented to person, place, and time. Protective sensation intact  5/5 intact bilaterally with 10g monofilament b/l.  Dermatologic Pedal skin with normal turgor, texture and tone bilaterally. No open wounds bilaterally. No interdigital macerations bilaterally. Preulcerative lesion noted submet head 2 left foot, submet head 2 right foot and submet head 3 left foot. There is visible subdermal hemorrhage. There is tenderness to palpation. There is no surrounding erythema, no edema, no drainage, no odor, no fluctuance.  Orthopedic: Normal muscle strength 5/5 to all lower extremity muscle groups bilaterally. No pain crepitus or joint limitation noted with ROM b/l. Severe hammertoe deformity noted 1-5 bilaterally.   Radiographs: None Assessment:   1. Pre-ulcerative calluses   2. Plantar flexed metatarsal, unspecified laterality   3. Rheumatoid arthritis involving both feet with positive rheumatoid factor ([place])    Plan:  Patient was evaluated and treated and all questions answered.  -Examined patient. -Patient to continue soft, supportive shoe gear daily. -Preulcerative lesion pared submet head 2 left foot, submet head 2 right foot and submet head 3 left foot.  -Patient to report any pedal injuries to medical professional immediately. -Continue offloaded Darco shoe when at home.. -Patient/POA to call should there be question/concern in the interim.  Return in about 6 weeks (around 04/17/2021), sooner if any problems arise.  Marzetta Board, DPM

## 2021-03-26 ENCOUNTER — Ambulatory Visit: Payer: Medicare Other | Admitting: Podiatry

## 2021-03-28 DIAGNOSIS — E7800 Pure hypercholesterolemia, unspecified: Secondary | ICD-10-CM | POA: Diagnosis not present

## 2021-03-28 DIAGNOSIS — D3002 Benign neoplasm of left kidney: Secondary | ICD-10-CM | POA: Diagnosis not present

## 2021-03-28 DIAGNOSIS — F330 Major depressive disorder, recurrent, mild: Secondary | ICD-10-CM | POA: Diagnosis not present

## 2021-03-28 DIAGNOSIS — M0579 Rheumatoid arthritis with rheumatoid factor of multiple sites without organ or systems involvement: Secondary | ICD-10-CM | POA: Diagnosis not present

## 2021-03-28 DIAGNOSIS — R7303 Prediabetes: Secondary | ICD-10-CM | POA: Diagnosis not present

## 2021-03-28 DIAGNOSIS — E039 Hypothyroidism, unspecified: Secondary | ICD-10-CM | POA: Diagnosis not present

## 2021-03-28 DIAGNOSIS — M2550 Pain in unspecified joint: Secondary | ICD-10-CM | POA: Diagnosis not present

## 2021-03-28 DIAGNOSIS — M150 Primary generalized (osteo)arthritis: Secondary | ICD-10-CM | POA: Diagnosis not present

## 2021-03-28 DIAGNOSIS — F419 Anxiety disorder, unspecified: Secondary | ICD-10-CM | POA: Diagnosis not present

## 2021-03-28 DIAGNOSIS — Z6823 Body mass index (BMI) 23.0-23.9, adult: Secondary | ICD-10-CM | POA: Diagnosis not present

## 2021-03-28 DIAGNOSIS — Z79899 Other long term (current) drug therapy: Secondary | ICD-10-CM | POA: Diagnosis not present

## 2021-03-28 DIAGNOSIS — M810 Age-related osteoporosis without current pathological fracture: Secondary | ICD-10-CM | POA: Diagnosis not present

## 2021-03-28 DIAGNOSIS — I10 Essential (primary) hypertension: Secondary | ICD-10-CM | POA: Diagnosis not present

## 2021-03-28 DIAGNOSIS — L237 Allergic contact dermatitis due to plants, except food: Secondary | ICD-10-CM | POA: Diagnosis not present

## 2021-03-28 DIAGNOSIS — K219 Gastro-esophageal reflux disease without esophagitis: Secondary | ICD-10-CM | POA: Diagnosis not present

## 2021-03-28 DIAGNOSIS — I2510 Atherosclerotic heart disease of native coronary artery without angina pectoris: Secondary | ICD-10-CM | POA: Diagnosis not present

## 2021-04-17 ENCOUNTER — Ambulatory Visit (INDEPENDENT_AMBULATORY_CARE_PROVIDER_SITE_OTHER): Payer: Medicare Other | Admitting: Podiatry

## 2021-04-17 ENCOUNTER — Telehealth: Payer: Self-pay | Admitting: *Deleted

## 2021-04-17 ENCOUNTER — Other Ambulatory Visit: Payer: Self-pay

## 2021-04-17 DIAGNOSIS — L97521 Non-pressure chronic ulcer of other part of left foot limited to breakdown of skin: Secondary | ICD-10-CM

## 2021-04-17 DIAGNOSIS — M069 Rheumatoid arthritis, unspecified: Secondary | ICD-10-CM

## 2021-04-17 NOTE — Telephone Encounter (Signed)
Patient is calling to request a foot ointment discussed during visit on today,thought she had some at home but does not.  She can pick up tomorrow morning if left at front desk. Please advise.

## 2021-04-18 NOTE — Telephone Encounter (Signed)
Yes, she will be coming in to purchase a tube of Iodosorb Gel. Ask one of the medical assistants for it. Thanks in advance and have a good day, ladies!

## 2021-04-18 NOTE — Telephone Encounter (Signed)
Gave medication to front desk(Jena) for patient's pickup.

## 2021-04-20 ENCOUNTER — Encounter: Payer: Self-pay | Admitting: Podiatry

## 2021-04-20 NOTE — Progress Notes (Signed)
Subjective:   [name] presents today with cc of painful left foot. She has h/o RA and is on immunosuppressive medication to control this. Patient states foot has been bothering her for the past couple of weeks.  Patient denies fever, chills, nightsweats, nausea or vomiting.  She states she has been wearing her offloaded Darco shoe.  Current Outpatient Medications on File Prior to Visit  Medication Sig Dispense Refill   acetaminophen-codeine (TYLENOL #3) 300-30 MG per tablet Take 1 tablet by mouth every 4 (four) hours as needed for moderate pain.      ALPRAZolam (XANAX) 0.25 MG tablet Take 0.125 mg by mouth daily as needed for anxiety.      amLODipine (NORVASC) 2.5 MG tablet      betamethasone dipropionate (DIPROLENE) 0.05 % cream      Calcium Citrate-Vitamin D (CITRACAL + D PO) Take 1 tablet by mouth daily.     cephALEXin (KEFLEX) 500 MG capsule Take 500 mg by mouth 2 (two) times daily.     cholecalciferol (VITAMIN D) 1000 UNITS tablet Take 1,000 Units by mouth daily.     clobetasol cream (TEMOVATE) 0.05 %      cyclobenzaprine (FLEXERIL) 10 MG tablet TK 1/2 TO 1 T PO Q 8 H PRF MUSCLE SPASM     denosumab (PROLIA) 60 MG/ML SOLN injection Inject 60 mg into the skin every 6 (six) months. Administer in upper arm, thigh, or abdomen     diclofenac Sodium (VOLTAREN) 1 % GEL      dicyclomine (BENTYL) 10 MG capsule Take 10 mg by mouth daily as needed for spasms.      doxycycline (VIBRA-TABS) 100 MG tablet Take 1 tablet (100 mg total) by mouth 2 (two) times daily. 20 tablet 0   fish oil-omega-3 fatty acids 1000 MG capsule Take 1,200 mg by mouth daily.      fluorouracil (EFUDEX) 5 % cream Apply topically.     folic acid (FOLVITE) 1 MG tablet Take 2 mg by mouth daily.      hydrocortisone 2.5 % cream hydrocortisone 2.5 % topical cream     levothyroxine (SYNTHROID, LEVOTHROID) 150 MCG tablet Take 150 mcg by mouth daily before breakfast.     loratadine (CLARITIN) 10 MG tablet Take by mouth.      Melatonin 2.5 MG CAPS Take 2.5 mg by mouth at bedtime as needed.      methotrexate (50 MG/ML) 1 G injection Inject 0.8 mg into the vein once a week. Sunday     Methotrexate Sodium (METHOTREXATE, PF,) 50 MG/2ML injection      Multiple Vitamin (MULTIVITAMIN) capsule Take 1 capsule by mouth daily.     naproxen (NAPRELAN) 500 MG 24 hr tablet Take 500 mg by mouth daily with breakfast.      naproxen (NAPROSYN) 500 MG tablet naproxen 500 mg tablet     neomycin-polymyxin-dexameth (MAXITROL) 0.1 % OINT neomycin 3.5 mg/g-polymyxin B 10,000 unit/g-dexameth 0.1 % eye oint  APPLY TO LIDS TWICE A DAY FOR 10 DAYS     Polyethylene Glycol 3350 (MIRALAX PO) Take by mouth See admin instructions. 3/4 a capful once daily     potassium chloride (K-DUR) 10 MEQ tablet Take by mouth.     potassium chloride SA (K-DUR,KLOR-CON) 20 MEQ tablet Take 20 mEq by mouth daily.     predniSONE (DELTASONE) 5 MG tablet prednisone 5 mg tablets in a dose pack  Take 1 dose pk by oral route as directed for 6 days.  ranitidine (ZANTAC) 75 MG tablet Take 75 mg by mouth as needed for heartburn.     simvastatin (ZOCOR) 20 MG tablet Take 20 mg by mouth every evening.     simvastatin (ZOCOR) 40 MG tablet      sodium chloride (OCEAN) 0.65 % nasal spray      triamterene-hydrochlorothiazide (MAXZIDE-25) 37.5-25 MG per tablet Take 1 tablet by mouth daily.     vitamin B-12 (CYANOCOBALAMIN) 500 MCG tablet Take by mouth.     No current facility-administered medications on file prior to visit.     Allergies  Allergen Reactions   Dilaudid [Hydromorphone Hcl] Nausea And Vomiting   Alendronate Other (See Comments)    Other reaction(s): Severe heartburn   Azithromycin     Other reaction(s): made her feel bad   Morphine And Related    Parathyroid Hormone (Recomb)     Other reaction(s): (radiation therapy for breast cancer)   Phenergan [Promethazine Hcl]    Phenergan [Promethazine]     Other reaction(s): hallucinations   Tamiflu  [Oseltamivir Phosphate]     rash   Taxol [Paclitaxel]     rash   Tetanus Immune Globulin    Tetanus Toxoid, Adsorbed     Other reaction(s): localized reaction   Tetanus Toxoids    Tramadol Other (See Comments)   Oseltamivir Rash    Other reaction(s): Rash     Objective:   There were no vitals filed for this visit.   Vascular Examination: Capillary refill time to digits immediate b/l. Palpable DP pulse(s) b/l lower extremities Palpable PT pulse(s) b/l lower extremities Pedal hair present. Lower extremity skin temperature gradient within normal limits. No pain with calf compression b/l.  Dermatological Examination: Pedal skin with normal turgor, texture and tone b/l lower extremities Toenails b/l feet well maintained with adequate length. No erythema, no edema, no drainage, no fluctuance.  No images are attached to the encounter.  Wound Location: submet head 2 left foot There is a minimal amount of devitalized tissue present in the wound. Predebridement Wound Measurement:  1.0  x 0.2 cm. Postdebridement Wound Measurement: 1.0 x 0.4 x 0.1 cm. Wound Base: Granular/Healthy Peri-wound: Calloused Exudate: None: wound tissue dry Blood Loss during debridement: 0 cc('s). Material in wound which inhibits healing/promotes adjacent tissue breakdown:  nonviable hyperkeratosis. Description of tissue removed from ulceration today:  nonviable hyperkeratosis. Sign(s) of clinical bacterial infection: no clinical signs of infection noted on examination today.   Musculoskeletal Examination: Normal muscle strength 5/5 to all lower extremity muscle groups bilaterally. No pain crepitus or joint limitation noted with ROM b/l. Severe hammertoe deformity noted b/l feet.  Neurological Examination: Protective sensation intact 5/5 intact bilaterally with 10g monofilament b/l. Vibratory sensation intact b/l.  Assessment:   1. Skin ulcer of left foot, limited to breakdown of skin ([place])  2. Rheumatoid  arthritis of left foot, unspecified whether rheumatoid factor present ([place])   Plan: -Patient was evaluated and treated and all questions answered.  -Patient/POA/Family member educated on diagnosis and treatment plan of routine ulcer debridement/wound care.  -Ulceration debridement achieved utilizing sharp excisional debridement with sterile scalpel blade and sterile currette. Type/amount of devitalized tissue removed: nonviable hyperkeratosis. -Today's ulcer size post-debridement: 1.0 x 0.4 x 0.1 cm. -Ulceration cleansed with wound cleanser. Iodosorb Gel applied to base of ulceration and secured with light dressing. She is to perform daily dressing changes with Iodosorb Gel.  -Wound responded well to today's debridement. -Patient risk factors affecting healing of ulcer: foot deformity, recurrence, immunosuppresive medication -  She is to continue wearing her offloaded Darco shoe daily.  -Patient to report any pedal injuries to medical professional immediately. -Patient/POA to call should there be question/concern in the interim.  Return in about 2 weeks (around 05/01/2021) for left foot ulcer, at risk foot care.  Marzetta Board, DPM

## 2021-04-29 DIAGNOSIS — Z961 Presence of intraocular lens: Secondary | ICD-10-CM | POA: Diagnosis not present

## 2021-04-29 DIAGNOSIS — H52203 Unspecified astigmatism, bilateral: Secondary | ICD-10-CM | POA: Diagnosis not present

## 2021-04-29 DIAGNOSIS — H524 Presbyopia: Secondary | ICD-10-CM | POA: Diagnosis not present

## 2021-04-29 DIAGNOSIS — H04123 Dry eye syndrome of bilateral lacrimal glands: Secondary | ICD-10-CM | POA: Diagnosis not present

## 2021-05-01 ENCOUNTER — Encounter: Payer: Self-pay | Admitting: Podiatry

## 2021-05-01 ENCOUNTER — Other Ambulatory Visit: Payer: Self-pay

## 2021-05-01 ENCOUNTER — Ambulatory Visit (INDEPENDENT_AMBULATORY_CARE_PROVIDER_SITE_OTHER): Payer: Medicare Other | Admitting: Podiatry

## 2021-05-01 DIAGNOSIS — Z872 Personal history of diseases of the skin and subcutaneous tissue: Secondary | ICD-10-CM

## 2021-05-04 NOTE — Progress Notes (Signed)
  Subjective:  Patient ID: [name], female    DOB: 19-Apr-1934,  MRN: [id number]  87 y.o. female presents with follow up plantar ulcer left foot. She states she has been applying the Iodosorb Ointment as instructed. She has also been wearing her postop shoe. She states the left foot feels better on today .  She denies any redness, drainage or swelling. No fever, chills, nightsweats, nausea or vomiting.  PCP: Jonathon Jordan, MD and last visit was: 09/27/2020.  Review of Systems: Negative except as noted in the HPI.   Allergies  Allergen Reactions   Dilaudid [Hydromorphone Hcl] Nausea And Vomiting   Alendronate Other (See Comments)    Other reaction(s): Severe heartburn   Azithromycin     Other reaction(s): made her feel bad   Morphine And Related    Parathyroid Hormone (Recomb)     Other reaction(s): (radiation therapy for breast cancer)   Phenergan [Promethazine Hcl]    Phenergan [Promethazine]     Other reaction(s): hallucinations   Tamiflu [Oseltamivir Phosphate]     rash   Taxol [Paclitaxel]     rash   Tetanus Immune Globulin    Tetanus Toxoid, Adsorbed     Other reaction(s): localized reaction   Tetanus Toxoids    Tramadol Other (See Comments)   Oseltamivir Rash    Other reaction(s): Rash    Objective:  There were no vitals filed for this visit. Constitutional Patient is a pleasant 87 y.o. Caucasian female WD, WN in NAD. AAO x 3.  Vascular Capillary refill time to digits immediate b/l. Palpable pedal pulses b/l LE. Pedal hair present. Lower extremity skin temperature gradient within normal limits. No pain with calf compression b/l. No edema noted b/l lower extremities. No cyanosis or clubbing noted.  Neurologic Normal speech. Protective sensation intact 5/5 intact bilaterally with 10g monofilament b/l. Vibratory sensation intact b/l.  Dermatologic Pedal skin with normal turgor, texture and tone b/l lower extremities. Toenails recently debrided. Preulcerative lesion  noted submet head 2 left foot. There is visible subdermal hemorrhage. There is no surrounding erythema, no edema, no drainage, no odor, no fluctuance.  Orthopedic: Normal muscle strength 5/5 to all lower extremity muscle groups bilaterally. No pain crepitus or joint limitation noted with ROM b/l. Severe hammertoe deformity noted 2-5 bilaterally.   Assessment:   1. Healed ulcer of left foot    Plan:  Patient was evaluated and treated and all questions answered. -Examined patient. -Patient to continue soft, supportive shoe gear daily. -Preulcerative lesion pared submet head 2 left foot. -She may continue applying a light amount of Iodosorb Gel to area once daily. Continue non-medicated callus pads daily. Use offloaded Darco shoe when lesion becomes tender. Call office if she has any problems. -Patient to report any pedal injuries to medical professional immediately. -Patient/POA to call should there be question/concern in the interim.  Return in about 6 weeks (around 06/12/2021).  Marzetta Board, DPM

## 2021-06-05 ENCOUNTER — Other Ambulatory Visit: Payer: Self-pay | Admitting: Podiatry

## 2021-06-05 ENCOUNTER — Ambulatory Visit (INDEPENDENT_AMBULATORY_CARE_PROVIDER_SITE_OTHER): Payer: Medicare Other | Admitting: Podiatry

## 2021-06-05 ENCOUNTER — Encounter: Payer: Self-pay | Admitting: Podiatry

## 2021-06-05 ENCOUNTER — Other Ambulatory Visit: Payer: Self-pay

## 2021-06-05 ENCOUNTER — Ambulatory Visit (INDEPENDENT_AMBULATORY_CARE_PROVIDER_SITE_OTHER): Payer: Medicare Other

## 2021-06-05 DIAGNOSIS — L97522 Non-pressure chronic ulcer of other part of left foot with fat layer exposed: Secondary | ICD-10-CM

## 2021-06-05 DIAGNOSIS — Z872 Personal history of diseases of the skin and subcutaneous tissue: Secondary | ICD-10-CM

## 2021-06-05 DIAGNOSIS — E11621 Type 2 diabetes mellitus with foot ulcer: Secondary | ICD-10-CM

## 2021-06-05 DIAGNOSIS — L97521 Non-pressure chronic ulcer of other part of left foot limited to breakdown of skin: Secondary | ICD-10-CM | POA: Diagnosis not present

## 2021-06-05 DIAGNOSIS — M069 Rheumatoid arthritis, unspecified: Secondary | ICD-10-CM

## 2021-06-05 MED ORDER — DOXYCYCLINE HYCLATE 100 MG PO TABS: 100.0000 mg | ORAL_TABLET | Freq: Two times a day (BID) | ORAL | 0 refills | Status: AC

## 2021-06-05 NOTE — Progress Notes (Signed)
Subjective:   [name] presents today with cc of painful left foot. Patient states foot has been bothering her for the past several days. She states her husband was hospitalized at [facility] and she has been walking on her feet more down the hospital halls to see him.  She states the left foot is more sore than usual and wonders it is is infected. It has not drained, but she sees a red spot under the lesion. Patient denies any fever, chills, nightsweats, nausea or vomiting.  She has seen myself, Dr. Lanae Franco and Dr. Boneta Franco for her condition. Surgical excision of the metatarsal head(s) was recommended as an option to resolve her condition, but she currently does not have support to have this done as her husband is a resident at [place] [facility] [place]. [name] remains the primary caretaker of her husband. Her daughter will be coming in town soon and staying for about three weeks to assist with caring for [name].   She has been wearing her offloaded Darco shoe with peg-assist.  Current Outpatient Medications on File Prior to Visit  Medication Sig Dispense Refill   acetaminophen-codeine (TYLENOL #3) 300-30 MG per tablet Take 1 tablet by mouth every 4 (four) hours as needed for moderate pain.      ALPRAZolam (XANAX) 0.25 MG tablet Take 0.125 mg by mouth daily as needed for anxiety.      amLODipine (NORVASC) 2.5 MG tablet      betamethasone dipropionate (DIPROLENE) 0.05 % cream      Calcium Citrate-Vitamin D (CITRACAL + D PO) Take 1 tablet by mouth daily.     cephALEXin (KEFLEX) 500 MG capsule Take 500 mg by mouth 2 (two) times daily.     cholecalciferol (VITAMIN D) 1000 UNITS tablet Take 1,000 Units by mouth daily.     clobetasol cream (TEMOVATE) 0.05 %      cyclobenzaprine (FLEXERIL) 10 MG tablet TK 1/2 TO 1 T PO Q 8 H PRF MUSCLE SPASM     denosumab (PROLIA) 60 MG/ML SOLN injection Inject 60 mg into the skin every 6 (six) months. Administer in upper arm, thigh, or abdomen      diclofenac Sodium (VOLTAREN) 1 % GEL      dicyclomine (BENTYL) 10 MG capsule Take 10 mg by mouth daily as needed for spasms.      fish oil-omega-3 fatty acids 1000 MG capsule Take 1,200 mg by mouth daily.      fluorouracil (EFUDEX) 5 % cream Apply topically.     folic acid (FOLVITE) 1 MG tablet Take 2 mg by mouth daily.      hydrocortisone 2.5 % cream hydrocortisone 2.5 % topical cream     levothyroxine (SYNTHROID, LEVOTHROID) 150 MCG tablet Take 150 mcg by mouth daily before breakfast.     loratadine (CLARITIN) 10 MG tablet Take by mouth.     Melatonin 2.5 MG CAPS Take 2.5 mg by mouth at bedtime as needed.      methotrexate (50 MG/ML) 1 G injection Inject 0.8 mg into the vein once a week. Sunday     Methotrexate Sodium (METHOTREXATE, PF,) 50 MG/2ML injection      Multiple Vitamin (MULTIVITAMIN) capsule Take 1 capsule by mouth daily.     naproxen (NAPRELAN) 500 MG 24 hr tablet Take 500 mg by mouth daily with breakfast.      naproxen (NAPROSYN) 500 MG tablet naproxen 500 mg tablet     neomycin-polymyxin-dexameth (MAXITROL) 0.1 % OINT neomycin  3.5 mg/g-polymyxin B 10,000 unit/g-dexameth 0.1 % eye oint  APPLY TO LIDS TWICE A DAY FOR 10 DAYS     Polyethylene Glycol 3350 (MIRALAX PO) Take by mouth See admin instructions. 3/4 a capful once daily     potassium chloride (K-DUR) 10 MEQ tablet Take by mouth.     potassium chloride SA (K-DUR,KLOR-CON) 20 MEQ tablet Take 20 mEq by mouth daily.     predniSONE (STERAPRED UNI-PAK 21 TAB) 5 MG (21) TBPK tablet Take by mouth.     ranitidine (ZANTAC) 75 MG tablet Take 75 mg by mouth as needed for heartburn.     simvastatin (ZOCOR) 20 MG tablet Take 20 mg by mouth every evening.     simvastatin (ZOCOR) 40 MG tablet      sodium chloride (OCEAN) 0.65 % nasal spray      triamcinolone cream (KENALOG) 0.5 % Apply topically.     triamterene-hydrochlorothiazide (MAXZIDE-25) 37.5-25 MG per tablet Take 1 tablet by mouth daily.     vitamin B-12 (CYANOCOBALAMIN) 500  MCG tablet Take by mouth.     No current facility-administered medications on file prior to visit.     Allergies  Allergen Reactions   Dilaudid [Hydromorphone Hcl] Nausea And Vomiting   Alendronate Other (See Comments)    Other reaction(s): Severe heartburn   Azithromycin     Other reaction(s): made her feel bad   Morphine And Related    Parathyroid Hormone (Recomb)     Other reaction(s): (radiation therapy for breast cancer)   Phenergan [Promethazine Hcl]    Phenergan [Promethazine]     Other reaction(s): hallucinations   Tamiflu [Oseltamivir Phosphate]     rash   Taxol [Paclitaxel]     rash   Tetanus Immune Globulin    Tetanus Toxoid, Adsorbed     Other reaction(s): localized reaction   Tetanus Toxoids    Tramadol Other (See Comments)   Oseltamivir Rash    Other reaction(s): Rash     Objective:   There were no vitals filed for this visit.   Vascular Examination: Capillary refill time to digits immediate b/l. Palpable DP pulse(s) b/l lower extremities Palpable PT pulse(s) b/l lower extremities Pedal hair present. Lower extremity skin temperature gradient within normal limits. No pain with calf compression b/l. No edema noted b/l lower extremities.  Dermatological Examination: Skin warm and supple b/l lower extremities. No interdigital macerations b/l lower extremities. Toenails recently debrided. Hyperkeratotic lesion(s) submet head 2 right foot.  No erythema, no edema, no drainage, no fluctuance.  No images are attached to the encounter.  Wound Location: submet head 2 left foot There is a moderate amount of devitalized hyperkeratotic tissue present in the wound. Predebridement Wound Measurement:  1.0 x 1.5 cm hyperkeratotic lesion with visible subdermal hemorrhage. +Tenderness to palpation. No surrounding erythema, no edema, minimal fluctuance. No odor. No tracking. No tunneling. Postdebridement Wound Measurement: 0.7 x 0.5 x 0.2 cm. Wound Base: Mixed  Granular/Fibrotic Peri-wound: Normal Exudate: Scant/small amount Serous exudate Blood Loss during debridement: 0 cc('s). Material in wound which inhibits healing/promotes adjacent tissue breakdown: devitalized hyperkeratosis Description of tissue removed from ulceration today:  nonviable hyperkeratosis. Sign(s) of clinical bacterial infection: serous drainage  Musculoskeletal Examination: Hammertoe(s) noted to the 2-5 bilaterally.  Neurological Examination: Protective sensation intact 5/5 intact bilaterally with 10g monofilament b/l. Vibratory sensation intact b/l.  XRAY left foot No gas in tissues left foot. Contracted digits 2-5 left foot. No bone erosion noted at location of ulceration left foot. No evidence of  fracture left foot. Metatarsal heads osteoporotic 1-5 left foot Hallux valgus with bunion deformity left foot  Assessment:   1. Ulcer of left foot with fat layer exposed ([place]) - DG Foot Complete Left; Future - doxycycline (VIBRA-TABS) 100 MG tablet; Take 1 tablet (100 mg total) by mouth 2 (two) times daily for 10 days.  Dispense: 20 tablet; Refill: 0 - WOUND CULTURE  2. Rheumatoid arthritis of left foot, unspecified whether rheumatoid factor present ([place])   Plan: -Patient was evaluated and treated and all questions answered.  -Patient/POA/Family member educated on diagnosis and treatment plan of routine ulcer debridement/wound care.  -Ulceration debridement achieved utilizing sharp excisional debridement with sterile scalpel blade.. Type/amount of devitalized tissue removed: nonviable hyperkeratosis -Today's ulcer size post-debridement: 0.7 x 0.5 x 0.2 cm. -Ulceration cleansed with wound cleanser. Iodosorb Gel applied to base of ulceration and secured with light dressing. -Wound responded well to today's debridement. -Patient risk factors affecting healing of ulcer: foot deformity, immunosuppressive medications, h/o recuurent ulceration. -[name] given verbal  instructions on daily wound care for left foot ulceration. Continue once daily Iodosorb Gel dressing changes until she sees Dr. Posey Pronto. -Rx for Doxycyline 100 mg, #20, to be taken one capsule twice daily for 10 days. -Wound culture and sensitivity ordered today of left foot ulceration. -Consultations ordered today:  -Radiology ordered today: X-Ray: left foot performed 3 views in-office today. I phoned her with results of xray. I also informed her MRI study is more sensitive and can detect infection in bone. Explained to patient if she continues to have pain, MRI may be warranted to rule out bone infection since this is a recurrent problem. She related understanding.  -We also discussed entertaining surgical excision of the metatarsal head to prevent ulceration, but she is the primary caretaker of her husband who has his own health problems. She is expecting her daughter to come in town soon from out of the country. Her daughter will assist Mrs. Proffit with her husband. Daughter will be in town approximately three weeks. She will discuss options with Dr. Boneta Franco at her next visit. -Patient scheduled to see Dr. Boneta Franco in one week for follow up of left foot ulceration. -Patient/POA to call should there be question/concern in the interim.  Return in about 1 week (around 06/12/2021).  Marzetta Board, DPM

## 2021-06-06 DIAGNOSIS — L814 Other melanin hyperpigmentation: Secondary | ICD-10-CM | POA: Diagnosis not present

## 2021-06-06 DIAGNOSIS — Z85828 Personal history of other malignant neoplasm of skin: Secondary | ICD-10-CM | POA: Diagnosis not present

## 2021-06-06 DIAGNOSIS — L570 Actinic keratosis: Secondary | ICD-10-CM | POA: Diagnosis not present

## 2021-06-06 DIAGNOSIS — D1801 Hemangioma of skin and subcutaneous tissue: Secondary | ICD-10-CM | POA: Diagnosis not present

## 2021-06-06 LAB — UNLABELED: Test Ordered On Req: 18881

## 2021-06-10 DIAGNOSIS — M810 Age-related osteoporosis without current pathological fracture: Secondary | ICD-10-CM | POA: Diagnosis not present

## 2021-06-10 LAB — WOUND CULTURE
MICRO NUMBER:: 12296757
SPECIMEN QUALITY:: ADEQUATE

## 2021-06-10 LAB — HOUSE ACCOUNT TRACKING

## 2021-06-10 LAB — PAT ID TIQ DOC: Test Affected: 18881

## 2021-06-12 ENCOUNTER — Other Ambulatory Visit: Payer: Self-pay | Admitting: Podiatry

## 2021-06-12 ENCOUNTER — Ambulatory Visit: Payer: Medicare Other | Admitting: Podiatry

## 2021-06-12 DIAGNOSIS — E11621 Type 2 diabetes mellitus with foot ulcer: Secondary | ICD-10-CM

## 2021-06-12 DIAGNOSIS — L97522 Non-pressure chronic ulcer of other part of left foot with fat layer exposed: Secondary | ICD-10-CM

## 2021-06-12 DIAGNOSIS — M5416 Radiculopathy, lumbar region: Secondary | ICD-10-CM | POA: Insufficient documentation

## 2021-06-14 ENCOUNTER — Other Ambulatory Visit: Payer: Self-pay

## 2021-06-14 ENCOUNTER — Ambulatory Visit (INDEPENDENT_AMBULATORY_CARE_PROVIDER_SITE_OTHER): Payer: Medicare Other | Admitting: Podiatry

## 2021-06-14 DIAGNOSIS — M216X2 Other acquired deformities of left foot: Secondary | ICD-10-CM

## 2021-06-14 DIAGNOSIS — L97522 Non-pressure chronic ulcer of other part of left foot with fat layer exposed: Secondary | ICD-10-CM

## 2021-06-20 NOTE — Progress Notes (Signed)
Subjective:  Patient ID: [name], female    DOB: [birth year],  MRN: [id number]  Chief Complaint  Patient presents with   Foot Ulcer    Left foot ulcer     87 y.o. female presents with the above complaint.  Patient is here to follow-up of left submetatarsal 2 ulceration.  Patient was being treated by Dr. Adah Perl and was referred to me for further work-up.  She states that it is doing a lot better.  She was keeping covered.  She was wearing her surgical shoe.  She wanted to get it evaluated make sure is not worsening.   Review of Systems: Negative except as noted in the HPI. Denies N/V/F/Ch.  Past Medical History:  Diagnosis Date   Abnormal cardiovascular stress test 12/2013   s/p cath with mild nonobstructive CAD normal EF - managed medically   Breast cancer ([place]) 2008   RT, chemo, lumpectomy   Diverticulitis    DVT (deep vein thrombosis) in pregnancy    site of PICC catheter   HTN (hypertension)    Hyperlipidemia    Hypothyroidism    Kidney tumor 2010   RFA   Personal history of chemotherapy    Personal history of radiation therapy    Prediabetes    Rheumatoid arthritis(714.0)    UTI (lower urinary tract infection)     Current Outpatient Medications:    acetaminophen-codeine (TYLENOL #3) 300-30 MG per tablet, Take 1 tablet by mouth every 4 (four) hours as needed for moderate pain. , Disp: , Rfl:    ALPRAZolam (XANAX) 0.25 MG tablet, Take 0.125 mg by mouth daily as needed for anxiety. , Disp: , Rfl:    amLODipine (NORVASC) 2.5 MG tablet, , Disp: , Rfl:    betamethasone dipropionate (DIPROLENE) 0.05 % cream, , Disp: , Rfl:    Calcium Citrate-Vitamin D (CITRACAL + D PO), Take 1 tablet by mouth daily., Disp: , Rfl:    cephALEXin (KEFLEX) 500 MG capsule, Take 500 mg by mouth 2 (two) times daily., Disp: , Rfl:    cholecalciferol (VITAMIN D) 1000 UNITS tablet, Take 1,000 Units by mouth daily., Disp: , Rfl:    clobetasol cream (TEMOVATE) 0.05 %, , Disp: , Rfl:     cyclobenzaprine (FLEXERIL) 10 MG tablet, TK 1/2 TO 1 T PO Q 8 H PRF MUSCLE SPASM, Disp: , Rfl:    denosumab (PROLIA) 60 MG/ML SOLN injection, Inject 60 mg into the skin every 6 (six) months. Administer in upper arm, thigh, or abdomen, Disp: , Rfl:    diclofenac Sodium (VOLTAREN) 1 % GEL, , Disp: , Rfl:    dicyclomine (BENTYL) 10 MG capsule, Take 10 mg by mouth daily as needed for spasms. , Disp: , Rfl:    fish oil-omega-3 fatty acids 1000 MG capsule, Take 1,200 mg by mouth daily. , Disp: , Rfl:    fluorouracil (EFUDEX) 5 % cream, Apply topically., Disp: , Rfl:    folic acid (FOLVITE) 1 MG tablet, Take 2 mg by mouth daily. , Disp: , Rfl:    hydrocortisone 2.5 % cream, hydrocortisone 2.5 % topical cream, Disp: , Rfl:    levothyroxine (SYNTHROID, LEVOTHROID) 150 MCG tablet, Take 150 mcg by mouth daily before breakfast., Disp: , Rfl:    loratadine (CLARITIN) 10 MG tablet, Take by mouth., Disp: , Rfl:    Melatonin 2.5 MG CAPS, Take 2.5 mg by mouth at bedtime as needed. , Disp: , Rfl:    methotrexate (50 MG/ML) 1 G injection, Inject 0.8  mg into the vein once a week. Sunday, Disp: , Rfl:    Methotrexate Sodium (METHOTREXATE, PF,) 50 MG/2ML injection, , Disp: , Rfl:    Multiple Vitamin (MULTIVITAMIN) capsule, Take 1 capsule by mouth daily., Disp: , Rfl:    naproxen (NAPRELAN) 500 MG 24 hr tablet, Take 500 mg by mouth daily with breakfast. , Disp: , Rfl:    naproxen (NAPROSYN) 500 MG tablet, naproxen 500 mg tablet, Disp: , Rfl:    neomycin-polymyxin-dexameth (MAXITROL) 0.1 % OINT, neomycin 3.5 mg/g-polymyxin B 10,000 unit/g-dexameth 0.1 % eye oint  APPLY TO LIDS TWICE A DAY FOR 10 DAYS, Disp: , Rfl:    Polyethylene Glycol 3350 (MIRALAX PO), Take by mouth See admin instructions. 3/4 a capful once daily, Disp: , Rfl:    potassium chloride (K-DUR) 10 MEQ tablet, Take by mouth., Disp: , Rfl:    potassium chloride SA (K-DUR,KLOR-CON) 20 MEQ tablet, Take 20 mEq by mouth daily., Disp: , Rfl:    predniSONE  (STERAPRED UNI-PAK 21 TAB) 5 MG (21) TBPK tablet, Take by mouth., Disp: , Rfl:    ranitidine (ZANTAC) 75 MG tablet, Take 75 mg by mouth as needed for heartburn., Disp: , Rfl:    simvastatin (ZOCOR) 20 MG tablet, Take 20 mg by mouth every evening., Disp: , Rfl:    simvastatin (ZOCOR) 40 MG tablet, , Disp: , Rfl:    sodium chloride (OCEAN) 0.65 % nasal spray, , Disp: , Rfl:    triamcinolone cream (KENALOG) 0.5 %, Apply topically., Disp: , Rfl:    triamterene-hydrochlorothiazide (MAXZIDE-25) 37.5-25 MG per tablet, Take 1 tablet by mouth daily., Disp: , Rfl:    vitamin B-12 (CYANOCOBALAMIN) 500 MCG tablet, Take by mouth., Disp: , Rfl:   Social History   Tobacco Use  Smoking Status Never  Smokeless Tobacco Never    Allergies  Allergen Reactions   Dilaudid [Hydromorphone Hcl] Nausea And Vomiting   Alendronate Other (See Comments)    Other reaction(s): Severe heartburn   Azithromycin     Other reaction(s): made her feel bad   Morphine And Related    Parathyroid Hormone (Recomb)     Other reaction(s): (radiation therapy for breast cancer)   Phenergan [Promethazine Hcl]    Phenergan [Promethazine]     Other reaction(s): hallucinations   Tamiflu [Oseltamivir Phosphate]     rash   Taxol [Paclitaxel]     rash   Tetanus Immune Globulin    Tetanus Toxoid, Adsorbed     Other reaction(s): localized reaction   Tetanus Toxoids    Tramadol Other (See Comments)   Oseltamivir Rash    Other reaction(s): Rash   Objective:  There were no vitals filed for this visit. There is no height or weight on file to calculate BMI. Constitutional Well developed. Well nourished.  Vascular Dorsalis pedis pulses palpable bilaterally. Posterior tibial pulses palpable bilaterally. Capillary refill normal to all digits.  No cyanosis or clubbing noted. Pedal hair growth normal.  Neurologic Normal speech. Oriented to person, place, and time. Epicritic sensation to light touch grossly present bilaterally.   Dermatologic Left submetatarsal 2 ulceration with underlying plantarflexed second metatarsal.  No further ulceration noted.  Mild callus formation noted.  No concern for deeper wound noted.  Orthopedic: Normal joint ROM without pain or crepitus bilaterally. No visible deformities. No bony tenderness.   Radiographs: None Assessment:   1. Ulcer of left foot with fat layer exposed ([place])   2. Plantar flexed metatarsal bone of left foot    Plan:  Patient was evaluated and treated and all questions answered.  Left submetatarsal 2 ulceration with underlying plantarflexed second metatarsal -I explained to the patient etiology of ulceration worse treatment options were discussed.  I discussed offloading and shoe gear modification extensive detail.  At this time her ulcer has completely epithelialized.  I discussed with her briefly that she would benefit from a surgical intervention if this continues to happen and has progressed to gotten worse.  She states understanding.  No follow-ups on file.

## 2021-06-27 DIAGNOSIS — M0579 Rheumatoid arthritis with rheumatoid factor of multiple sites without organ or systems involvement: Secondary | ICD-10-CM | POA: Diagnosis not present

## 2021-06-27 DIAGNOSIS — Z23 Encounter for immunization: Secondary | ICD-10-CM | POA: Diagnosis not present

## 2021-07-13 DIAGNOSIS — M5416 Radiculopathy, lumbar region: Secondary | ICD-10-CM | POA: Diagnosis not present

## 2021-07-26 ENCOUNTER — Ambulatory Visit (INDEPENDENT_AMBULATORY_CARE_PROVIDER_SITE_OTHER): Payer: Medicare Other | Admitting: Podiatry

## 2021-07-26 ENCOUNTER — Other Ambulatory Visit: Payer: Self-pay

## 2021-07-26 DIAGNOSIS — L97521 Non-pressure chronic ulcer of other part of left foot limited to breakdown of skin: Secondary | ICD-10-CM

## 2021-07-26 DIAGNOSIS — B351 Tinea unguium: Secondary | ICD-10-CM

## 2021-07-26 DIAGNOSIS — M05771 Rheumatoid arthritis with rheumatoid factor of right ankle and foot without organ or systems involvement: Secondary | ICD-10-CM | POA: Diagnosis not present

## 2021-07-26 DIAGNOSIS — M05772 Rheumatoid arthritis with rheumatoid factor of left ankle and foot without organ or systems involvement: Secondary | ICD-10-CM

## 2021-07-26 DIAGNOSIS — L84 Corns and callosities: Secondary | ICD-10-CM

## 2021-07-26 DIAGNOSIS — M79674 Pain in right toe(s): Secondary | ICD-10-CM

## 2021-07-28 ENCOUNTER — Encounter: Payer: Self-pay | Admitting: Podiatry

## 2021-07-28 NOTE — Progress Notes (Signed)
Subjective: [name] is a 87 y.o. female patient seen today for follow up of painful thick toenails that are difficult to trim. Pain interferes with ambulation. Aggravating factors include wearing enclosed shoe gear. Pain is relieved with periodic professional debridement.  She states her left foot is painful on today. She feels as if callous had grown back. She has been wearing her Darco shoe when at home, but left foot still hurts. She denies any redness, swelling or drainage.   PCP is Jonathon Jordan, MD. Last visit was: 03/28/2021.  Allergies  Allergen Reactions   Dilaudid [Hydromorphone Hcl] Nausea And Vomiting   Alendronate Other (See Comments)    Other reaction(s): Severe heartburn   Azithromycin     Other reaction(s): made her feel bad   Morphine And Related    Parathyroid Hormone (Recomb)     Other reaction(s): (radiation therapy for breast cancer)   Phenergan [Promethazine Hcl]    Phenergan [Promethazine]     Other reaction(s): hallucinations   Tamiflu [Oseltamivir Phosphate]     rash   Taxol [Paclitaxel]     rash   Tetanus Immune Globulin    Tetanus Toxoid, Adsorbed     Other reaction(s): localized reaction   Tetanus Toxoids    Tramadol Other (See Comments)   Oseltamivir Rash    Other reaction(s): Rash    PCP is Jonathon Jordan, MD .  Objective: Physical Exam  General: Patient is a pleasant 87 y.o. Caucasian female WD, WN in NAD. AAO x 3.   Neurovascular Examination: Neurovascular status unchanged b/l lower extremities. Palpable pedal pulses b/l LE. Pedal hair sparse. Lower extremity skin temperature gradient within normal limits. No pain with calf compression b/l. No edema noted b/l lower extremities.  Protective sensation intact 5/5 intact bilaterally with 10g monofilament b/l. Vibratory sensation intact b/l.  Dermatological:  No interdigital macerations b/l lower extremities. Toenails 1-5 b/l elongated, discolored, dystrophic, thickened, crumbly with  subungual debris and tenderness to dorsal palpation. Hyperkeratotic lesion(s) submet head 2 right foot.  No erythema, no edema, no drainage, no fluctuance.  Wound Location: submet head 2 left foot There is a minimal amount of devitalized tissue present in the wound. Predebridement Wound Measurement:  nickel sized hyperkeratotic lesion with central area of subdermal hemorrhage. Area is tender to palpation. Postdebridement Wound Measurement: 0.5 x 0.5 x 0.1 cm. Wound Base: Fibrotic slough Peri-wound: Normal Exudate: None: wound tissue dry Blood Loss during debridement: 0 cc('s). Material in wound which inhibits healing/promotes adjacent tissue breakdown:  nonviable hyperkeratosis. Description of tissue removed from ulceration today:  nonviable hyperkeratosis. Sign(s) of clinical bacterial infection: no clinical signs of infection noted on examination today.   Musculoskeletal:  Normal muscle strength 5/5 to all lower extremity muscle groups bilaterally. Plantarflexed metatarsal(s) 2-5 bilaterally. Hammertoe(s) noted to the 2-5 bilaterally. Plantar fat pad atrophy of forefoot area b/l feet.  Assessment: 1. Pain due to onychomycosis of toenails of both feet   2. Skin ulcer of left foot, limited to breakdown of skin ([place])   3. Callus   4. Rheumatoid arthritis involving both feet with positive rheumatoid factor ([place])    Plan: Patient was evaluated and treated and all questions answered. Consent given for treatment as described below: -Examined patient. -Patient to continue soft, supportive shoe gear daily. -Toenails 1-5 b/l were debrided in length and girth with sterile nail nippers and dremel without iatrogenic bleeding.  -Callus(es) submet head 2 right foot pared utilizing sterile scalpel blade without complication or incident. Total number debrided =  1. -Patient/POA/Family member educated on diagnosis and treatment plan of routine ulcer debridement/wound care.  -Ulceration debridement  achieved utilizing sharp excisional debridement with sterile scalpel blade and sterile currette.. Type/amount of devitalized tissue removed: nonviable hyperkeratosis -Today's ulcer size post-debridement: 0.5 x 0.5 x 0.1 cm. -Ulceration cleansed with wound cleanser. Iodosorb Gel applied to base of ulceration and secured with light dressing. -Wound responded well to today's debridement. -Patient risk factors affecting healing of ulcer: foot deformity, immunosuppressive medications, recurrence -[name] given written instructions on daily wound care for submet head 2 left foot ulceration.She is to resume wearing her Darco shoe. -Patient/POA to call should there be question/concern in the interim.  Return in about 4 weeks (around 08/23/2021).  Marzetta Board, DPM

## 2021-08-01 DIAGNOSIS — Z23 Encounter for immunization: Secondary | ICD-10-CM | POA: Diagnosis not present

## 2021-08-07 ENCOUNTER — Other Ambulatory Visit (HOSPITAL_BASED_OUTPATIENT_CLINIC_OR_DEPARTMENT_OTHER): Payer: Self-pay | Admitting: Family Medicine

## 2021-08-07 ENCOUNTER — Ambulatory Visit (HOSPITAL_BASED_OUTPATIENT_CLINIC_OR_DEPARTMENT_OTHER)
Admission: RE | Admit: 2021-08-07 | Discharge: 2021-08-07 | Disposition: A | Discharge status: Home / Self Care | Payer: Medicare Other | Source: Ambulatory Visit | Attending: Family Medicine | Admitting: Family Medicine

## 2021-08-07 ENCOUNTER — Other Ambulatory Visit: Payer: Self-pay

## 2021-08-07 DIAGNOSIS — S0990XA Unspecified injury of head, initial encounter: Secondary | ICD-10-CM | POA: Insufficient documentation

## 2021-08-07 DIAGNOSIS — S0093XA Contusion of unspecified part of head, initial encounter: Secondary | ICD-10-CM | POA: Diagnosis not present

## 2021-08-07 DIAGNOSIS — S0083XA Contusion of other part of head, initial encounter: Secondary | ICD-10-CM | POA: Diagnosis not present

## 2021-08-07 DIAGNOSIS — H918X9 Other specified hearing loss, unspecified ear: Secondary | ICD-10-CM | POA: Diagnosis not present

## 2021-08-09 DIAGNOSIS — H903 Sensorineural hearing loss, bilateral: Secondary | ICD-10-CM | POA: Diagnosis not present

## 2021-08-16 ENCOUNTER — Other Ambulatory Visit: Payer: Self-pay | Admitting: Family Medicine

## 2021-08-16 DIAGNOSIS — N6310 Unspecified lump in the right breast, unspecified quadrant: Secondary | ICD-10-CM

## 2021-08-16 DIAGNOSIS — Z853 Personal history of malignant neoplasm of breast: Secondary | ICD-10-CM | POA: Diagnosis not present

## 2021-08-16 DIAGNOSIS — H9190 Unspecified hearing loss, unspecified ear: Secondary | ICD-10-CM | POA: Diagnosis not present

## 2021-08-23 ENCOUNTER — Encounter: Payer: Self-pay | Admitting: Podiatry

## 2021-08-23 ENCOUNTER — Ambulatory Visit (INDEPENDENT_AMBULATORY_CARE_PROVIDER_SITE_OTHER): Payer: Medicare Other | Admitting: Podiatry

## 2021-08-23 ENCOUNTER — Other Ambulatory Visit: Payer: Self-pay

## 2021-08-23 DIAGNOSIS — L97521 Non-pressure chronic ulcer of other part of left foot limited to breakdown of skin: Secondary | ICD-10-CM | POA: Diagnosis not present

## 2021-08-26 NOTE — Progress Notes (Signed)
Subjective:  Patient ID: [name], female    DOB: 09/15/1934,  MRN: [id number]  86 y.o. female is seen today for evaluation of left foot.  Prescribed wound care regimen: Iodosorb Gel dressing daily.  Patient is using Darco shoe with PegAssist.  Patient relates wound is unchanged.  Patient The patient is having no constitutional symptoms, denying fever, chills, anorexia, or weight loss..  Current Outpatient Medications on File Prior to Visit  Medication Sig Dispense Refill   acetaminophen-codeine (TYLENOL #3) 300-30 MG per tablet Take 1 tablet by mouth every 4 (four) hours as needed for moderate pain.      ALPRAZolam (XANAX) 0.25 MG tablet Take 0.125 mg by mouth daily as needed for anxiety.      amLODipine (NORVASC) 2.5 MG tablet      amoxicillin (AMOXIL) 500 MG capsule Take 500 mg by mouth 3 (three) times daily.     betamethasone dipropionate (DIPROLENE) 0.05 % cream      Calcium Citrate-Vitamin D (CITRACAL + D PO) Take 1 tablet by mouth daily.     cephALEXin (KEFLEX) 500 MG capsule Take 500 mg by mouth 2 (two) times daily.     cholecalciferol (VITAMIN D) 1000 UNITS tablet Take 1,000 Units by mouth daily.     clobetasol cream (TEMOVATE) 0.05 %      cyclobenzaprine (FLEXERIL) 10 MG tablet TK 1/2 TO 1 T PO Q 8 H PRF MUSCLE SPASM     denosumab (PROLIA) 60 MG/ML SOLN injection Inject 60 mg into the skin every 6 (six) months. Administer in upper arm, thigh, or abdomen     diclofenac Sodium (VOLTAREN) 1 % GEL      dicyclomine (BENTYL) 10 MG capsule Take 10 mg by mouth daily as needed for spasms.      fish oil-omega-3 fatty acids 1000 MG capsule Take 1,200 mg by mouth daily.      fluorouracil (EFUDEX) 5 % cream Apply topically.     folic acid (FOLVITE) 1 MG tablet Take 2 mg by mouth daily.      hydrocortisone 2.5 % cream hydrocortisone 2.5 % topical cream     IBUPROFEN PO ibuprofen     levothyroxine (SYNTHROID, LEVOTHROID) 150 MCG tablet Take 150 mcg by mouth daily before breakfast.      loratadine (CLARITIN) 10 MG tablet Take by mouth.     Melatonin 2.5 MG CAPS Take 2.5 mg by mouth at bedtime as needed.      methotrexate (50 MG/ML) 1 G injection Inject 0.8 mg into the vein once a week. Sunday     Methotrexate Sodium (METHOTREXATE, PF,) 50 MG/2ML injection      Multiple Vitamin (MULTIVITAMIN) capsule Take 1 capsule by mouth daily.     naproxen (NAPRELAN) 500 MG 24 hr tablet Take 500 mg by mouth daily with breakfast.      naproxen (NAPROSYN) 500 MG tablet naproxen 500 mg tablet     neomycin-polymyxin-dexameth (MAXITROL) 0.1 % OINT neomycin 3.5 mg/g-polymyxin B 10,000 unit/g-dexameth 0.1 % eye oint  APPLY TO LIDS TWICE A DAY FOR 10 DAYS     Polyethylene Glycol 3350 (MIRALAX PO) Take by mouth See admin instructions. 3/4 a capful once daily     potassium chloride (K-DUR) 10 MEQ tablet Take by mouth.     potassium chloride SA (K-DUR,KLOR-CON) 20 MEQ tablet Take 20 mEq by mouth daily.     predniSONE (STERAPRED UNI-PAK 21 TAB) 5 MG (21) TBPK tablet Take by mouth.     ranitidine (ZANTAC)  75 MG tablet Take 75 mg by mouth as needed for heartburn.     simvastatin (ZOCOR) 20 MG tablet Take 20 mg by mouth every evening.     simvastatin (ZOCOR) 40 MG tablet      sodium chloride (OCEAN) 0.65 % nasal spray      triamcinolone cream (KENALOG) 0.5 % Apply topically.     triamterene-hydrochlorothiazide (MAXZIDE-25) 37.5-25 MG per tablet Take 1 tablet by mouth daily.     vitamin B-12 (CYANOCOBALAMIN) 500 MCG tablet Take by mouth.     No current facility-administered medications on file prior to visit.     Allergies  Allergen Reactions   Dilaudid [Hydromorphone Hcl] Nausea And Vomiting   Alendronate Other (See Comments)    Other reaction(s): Severe heartburn Other reaction(s): Severe heartburn   Azithromycin     Other reaction(s): made her feel bad   Dilaudid [Hydromorphone]     Other reaction(s): severe N V   Morphine And Related    Parathyroid Hormone (Recomb)     Other  reaction(s): (radiation therapy for breast cancer)   Phenergan [Promethazine Hcl]    Promethazine     Other reaction(s): hallucinations Other reaction(s): hallucinations   Tamiflu [Oseltamivir Phosphate]     rash   Taxol [Paclitaxel]     rash   Tetanus Immune Globulin    Tetanus Toxoid, Adsorbed     Other reaction(s): localized reaction Other reaction(s): localized reaction   Tetanus Toxoids    Tramadol Other (See Comments)   Oseltamivir Rash    Other reaction(s): Rash Other reaction(s): Rash     Objective:  Physical Exam: There were no vitals filed for this visit.   [name] is a pleasant 87 y.o. Caucasian female WD, WN in NAD. AAO x 3.  Vascular Examination: CFT immediate b/l LE. Palpable DP/PT pulses b/l LE. Digital hair sparse b/l. Skin temperature gradient WNL b/l. No pain with calf compression b/l. No edema noted b/l. Lower extremity skin temperature gradient within normal limits.  Dermatological Examination: Pedal skin is warm and supple b/l LE. Toenails recently debrided.  No images are attached to the encounter.  Wound Location: submet head 2 left foot There is a minimal amount of devitalized tissue present in the wound. Predebridement Wound Measurement:  0.2  x 0.2 cm  Postdebridement Wound Measurement: 0.3 x 0.3 x 0.1 cm to level of subcutaneous tissue. Wound Base: Granular/Healthy Peri-wound: Normal Exudate: None: wound tissue dry Blood Loss during debridement: 0 cc('s). Material in wound which inhibits healing/promotes adjacent tissue breakdown:  nonviable hyperkeratosis. Description of tissue removed from ulceration today:  nonviable hyperkeratosis. Sign(s) of clinical bacterial infection: no clinical signs of infection noted on examination today.  Musculoskeletal Examination: Normal muscle strength 5/5 to all lower extremity muscle groups bilaterally. Plantar fat pad atrophy of forefoot area b/l feet.  Neurological Examination: Protective  sensation intact 5/5 intact bilaterally with 10g monofilament b/l. Vibratory sensation intact b/l.  Assessment:   1. Skin ulcer of left foot, limited to breakdown of skin ([place])     Plan:  -Patient was evaluated and treated and all questions answered.  -Patient/POA/Family member educated on diagnosis and treatment plan of routine ulcer debridement/wound care.  -Ulceration debridement achieved utilizing sharp excisional debridement with sterile scalpel blade and sterile currette.. Type/amount of devitalized tissue removed: nonviable hyperkeratosis -Today's ulcer size post-debridement: 0.3 x 0.3 x 0.1 cm. -Ulceration cleansed with wound cleanser. Iodosorb Gel applied to base of ulceration and secured with light dressing. She is to  continue Iodosorb Gel dressing changes daily. Continue offloaded Darco shoe daily. Call office if she has any problems.  -Wound responded well to today's debridement. -Patient risk factors affecting healing of ulcer: foot deformity, immunosuppressive medications, recurrence -Patient/POA to call should there be question/concern in the interim.  Return in about 5 weeks (around 09/27/2021) for follow up and nail care.  Marzetta Board, DPM

## 2021-08-30 ENCOUNTER — Other Ambulatory Visit: Payer: Self-pay

## 2021-08-30 ENCOUNTER — Ambulatory Visit
Admission: RE | Admit: 2021-08-30 | Discharge: 2021-08-30 | Disposition: A | Discharge status: Home / Self Care | Payer: Medicare Other | Source: Ambulatory Visit | Attending: Family Medicine | Admitting: Family Medicine

## 2021-08-30 ENCOUNTER — Other Ambulatory Visit: Payer: Self-pay | Admitting: Family Medicine

## 2021-08-30 DIAGNOSIS — N6310 Unspecified lump in the right breast, unspecified quadrant: Secondary | ICD-10-CM

## 2021-08-30 DIAGNOSIS — R922 Inconclusive mammogram: Secondary | ICD-10-CM | POA: Diagnosis not present

## 2021-09-10 DIAGNOSIS — H903 Sensorineural hearing loss, bilateral: Secondary | ICD-10-CM | POA: Diagnosis not present

## 2021-09-27 ENCOUNTER — Other Ambulatory Visit: Payer: Self-pay

## 2021-09-27 ENCOUNTER — Encounter: Payer: Self-pay | Admitting: Podiatry

## 2021-09-27 ENCOUNTER — Ambulatory Visit (INDEPENDENT_AMBULATORY_CARE_PROVIDER_SITE_OTHER): Payer: Medicare Other | Admitting: Podiatry

## 2021-09-27 DIAGNOSIS — M05771 Rheumatoid arthritis with rheumatoid factor of right ankle and foot without organ or systems involvement: Secondary | ICD-10-CM | POA: Diagnosis not present

## 2021-09-27 DIAGNOSIS — B351 Tinea unguium: Secondary | ICD-10-CM

## 2021-09-27 DIAGNOSIS — M79674 Pain in right toe(s): Secondary | ICD-10-CM | POA: Diagnosis not present

## 2021-09-27 DIAGNOSIS — M79675 Pain in left toe(s): Secondary | ICD-10-CM | POA: Diagnosis not present

## 2021-09-27 DIAGNOSIS — M05772 Rheumatoid arthritis with rheumatoid factor of left ankle and foot without organ or systems involvement: Secondary | ICD-10-CM

## 2021-09-27 DIAGNOSIS — Z872 Personal history of diseases of the skin and subcutaneous tissue: Secondary | ICD-10-CM | POA: Diagnosis not present

## 2021-09-27 DIAGNOSIS — L84 Corns and callosities: Secondary | ICD-10-CM

## 2021-09-27 NOTE — Progress Notes (Signed)
Subjective:  Patient ID: [name], female    DOB: 10/12/1934,  MRN: [id number]  [name] presents to clinic today for at risk foot care. Patient has history of chronic ulcer plantar aspect left forefoot. Patient is also on chronic immunosuppressive therapy for rheumatoid arthritis. She states left foot is tender today. She also relates Peg Assist has developed a hole in the off-loaded area and Dr. Posey Pronto placed a felt metatarsal pad on her last visit with him. She denies any redness, drainage or swelling. No fever, chills, night sweats, nausea or vomiting.  PCP is Jonathon Jordan, MD , and last visit was 09/20/2021.  Allergies  Allergen Reactions   Dilaudid [Hydromorphone Hcl] Nausea And Vomiting   Alendronate Other (See Comments)    Other reaction(s): Severe heartburn Other reaction(s): Severe heartburn   Azithromycin     Other reaction(s): made her feel bad   Dilaudid [Hydromorphone]     Other reaction(s): severe N V   Morphine And Related    Parathyroid Hormone (Recomb)     Other reaction(s): (radiation therapy for breast cancer)   Phenergan [Promethazine Hcl]    Promethazine     Other reaction(s): hallucinations Other reaction(s): hallucinations   Tamiflu [Oseltamivir Phosphate]     rash   Taxol [Paclitaxel]     rash   Tetanus Immune Globulin    Tetanus Toxoid, Adsorbed     Other reaction(s): localized reaction Other reaction(s): localized reaction   Tetanus Toxoids    Tramadol Other (See Comments)   Oseltamivir Rash    Other reaction(s): Rash Other reaction(s): Rash    Review of Systems: Negative except as noted in the HPI. Objective:   Constitutional [name] is a pleasant 87 y.o. Caucasian female, WD, WN in NAD. AAO x 3.   Vascular CFT <3 seconds b/l LE. Palpable DP pulse(s) b/l LE. Palpable PT pulse(s) b/l LE. Pedal hair sparse. No pain with calf compression b/l. No edema noted b/l LE. No cyanosis or clubbing noted b/l LE.  Neurologic Normal  speech. Oriented to person, place, and time. Protective sensation intact 5/5 intact bilaterally with 10g monofilament b/l. Vibratory sensation intact b/l.  Dermatologic Pedal integument with normal turgor, texture and tone BLE. Toenails 1-5 b/l elongated, discolored, dystrophic, thickened, crumbly with subungual debris and tenderness to dorsal palpation. Hyperkeratotic lesion(s) submet head 2 right foot.  No erythema, no edema, no drainage, no fluctuance. Preulcerative lesion noted submet head 2 left foot. There is visible subdermal hemorrhage. There is no surrounding erythema, no edema, no drainage, no odor, no fluctuance.  Orthopedic: Muscle strength 5/5 to all lower extremity muscle groups bilaterally. Wearing offloaded Darco shoe left foot. Severe hammertoe deformity noted 2-5 bilaterally.   Radiographs: None  Assessment:   1. Pain due to onychomycosis of toenails of both feet   2. Pre-ulcerative calluses   3. Callus   4. History of foot ulcer   5. Rheumatoid arthritis involving both feet with positive rheumatoid factor ([place])    Plan:  Patient was evaluated and treated and all questions answered. Consent given for treatment as described below: -Examined patient. -Mycotic toenails 1-5 bilaterally were debrided in length and girth with sterile nail nippers and dremel without incident. -Callus(es) submet head 2 right foot pared utilizing sterile scalpel blade without complication or incident. Total number debrided =1. -Preulcerative lesion pared submet head 2 left foot. Total number pared=1. -Patient to schedule appointment with Pedorthist Aaron Edelman Little for modification of Darco shoe left foot. I have also  asked her to bring in her sneakers and orthotics for modification as well to see if we can get her offloaded more on symptomatic lesions. -Patient/POA to call should there be question/concern in the interim.  Return in about 4 weeks (around 10/25/2021).  Marzetta Board, DPM

## 2021-10-04 ENCOUNTER — Ambulatory Visit: Payer: Medicare Other

## 2021-10-04 ENCOUNTER — Other Ambulatory Visit: Payer: Self-pay

## 2021-10-04 DIAGNOSIS — L97511 Non-pressure chronic ulcer of other part of right foot limited to breakdown of skin: Secondary | ICD-10-CM

## 2021-10-08 NOTE — Progress Notes (Signed)
SITUATION Reason for Consult: Follow-up with pegassist and wound healing sandal Patient / Caregiver Report: Patient has walked a hole in the pegassist  OBJECTIVE DATA History / Diagnosis:    ICD-10-CM   1. Skin ulcer of foot, right, limited to breakdown of skin ([place])  L97.511       Change in Pathology: None  ACTIONS PERFORMED Patient's equipment was checked for structural stability and fit. Replaced damaged pegassist with manual offloading of wound healing shoe. Power went out to the clinic during the appointment and offloading could not be fully completed. Recommended patient call and reschedule for offloading adjustment. Device(s) intact and fit is excellent. All questions answered and concerns addressed.  PLAN Follow-up at patient discretion due to power loss today. Plan of care discussed with and agreed upon by patient / caregiver.

## 2021-10-10 DIAGNOSIS — H903 Sensorineural hearing loss, bilateral: Secondary | ICD-10-CM | POA: Diagnosis not present

## 2021-10-10 DIAGNOSIS — H6123 Impacted cerumen, bilateral: Secondary | ICD-10-CM | POA: Diagnosis not present

## 2021-10-15 ENCOUNTER — Ambulatory Visit (INDEPENDENT_AMBULATORY_CARE_PROVIDER_SITE_OTHER): Payer: Medicare Other

## 2021-10-15 ENCOUNTER — Other Ambulatory Visit: Payer: Self-pay

## 2021-10-15 ENCOUNTER — Ambulatory Visit: Payer: Medicare Other

## 2021-10-15 ENCOUNTER — Other Ambulatory Visit: Payer: Self-pay | Admitting: Podiatry

## 2021-10-15 ENCOUNTER — Ambulatory Visit (INDEPENDENT_AMBULATORY_CARE_PROVIDER_SITE_OTHER): Payer: Medicare Other | Admitting: Podiatry

## 2021-10-15 ENCOUNTER — Encounter: Payer: Self-pay | Admitting: Podiatry

## 2021-10-15 DIAGNOSIS — M2550 Pain in unspecified joint: Secondary | ICD-10-CM | POA: Insufficient documentation

## 2021-10-15 DIAGNOSIS — L97529 Non-pressure chronic ulcer of other part of left foot with unspecified severity: Secondary | ICD-10-CM | POA: Diagnosis not present

## 2021-10-15 DIAGNOSIS — M79609 Pain in unspecified limb: Secondary | ICD-10-CM | POA: Insufficient documentation

## 2021-10-15 DIAGNOSIS — Z79899 Other long term (current) drug therapy: Secondary | ICD-10-CM | POA: Insufficient documentation

## 2021-10-15 DIAGNOSIS — S76302A Unspecified injury of muscle, fascia and tendon of the posterior muscle group at thigh level, left thigh, initial encounter: Secondary | ICD-10-CM | POA: Insufficient documentation

## 2021-10-15 DIAGNOSIS — M1991 Primary osteoarthritis, unspecified site: Secondary | ICD-10-CM | POA: Insufficient documentation

## 2021-10-15 DIAGNOSIS — M869 Osteomyelitis, unspecified: Secondary | ICD-10-CM | POA: Diagnosis not present

## 2021-10-15 DIAGNOSIS — L97522 Non-pressure chronic ulcer of other part of left foot with fat layer exposed: Secondary | ICD-10-CM

## 2021-10-15 MED ORDER — DOXYCYCLINE HYCLATE 100 MG PO CAPS: 100.0000 mg | ORAL_CAPSULE | Freq: Two times a day (BID) | ORAL | 0 refills | Status: AC

## 2021-10-16 DIAGNOSIS — Z0000 Encounter for general adult medical examination without abnormal findings: Secondary | ICD-10-CM | POA: Diagnosis not present

## 2021-10-16 DIAGNOSIS — E039 Hypothyroidism, unspecified: Secondary | ICD-10-CM | POA: Diagnosis not present

## 2021-10-16 DIAGNOSIS — I2510 Atherosclerotic heart disease of native coronary artery without angina pectoris: Secondary | ICD-10-CM | POA: Diagnosis not present

## 2021-10-16 DIAGNOSIS — I10 Essential (primary) hypertension: Secondary | ICD-10-CM | POA: Diagnosis not present

## 2021-10-16 DIAGNOSIS — D3002 Benign neoplasm of left kidney: Secondary | ICD-10-CM | POA: Diagnosis not present

## 2021-10-16 DIAGNOSIS — E7800 Pure hypercholesterolemia, unspecified: Secondary | ICD-10-CM | POA: Diagnosis not present

## 2021-10-16 DIAGNOSIS — M0579 Rheumatoid arthritis with rheumatoid factor of multiple sites without organ or systems involvement: Secondary | ICD-10-CM | POA: Diagnosis not present

## 2021-10-16 DIAGNOSIS — F330 Major depressive disorder, recurrent, mild: Secondary | ICD-10-CM | POA: Diagnosis not present

## 2021-10-16 DIAGNOSIS — K219 Gastro-esophageal reflux disease without esophagitis: Secondary | ICD-10-CM | POA: Diagnosis not present

## 2021-10-16 DIAGNOSIS — R7303 Prediabetes: Secondary | ICD-10-CM | POA: Diagnosis not present

## 2021-10-16 DIAGNOSIS — E2839 Other primary ovarian failure: Secondary | ICD-10-CM | POA: Diagnosis not present

## 2021-10-16 DIAGNOSIS — M48061 Spinal stenosis, lumbar region without neurogenic claudication: Secondary | ICD-10-CM | POA: Diagnosis not present

## 2021-10-17 NOTE — Progress Notes (Signed)
Subjective:  Patient ID: [name], female    DOB: Dec 25, 1933,  MRN: [id number]  88 y.o. female is seen today for evaluation of  chronic ulcer plantar aspect of left foot for several months. She has h/o rheumatoid arthritis and is on immunosuppressive therapy. This ulcer waxes and wanes. She has seen several physicians here who have suggested metatarsal head resection due to chronicity. Last visit, the insole on her Darco shoe was offloaded. She felt this was uncomfortable. She relates increased tenderness on plantar aspect of left foot. Patient does admit to using razor to trim some of the callus. She states she sterilized blade with alcohol and it did not bleed after she trimmed it. Patient feels she has developed a blister. She denies drainage, but feels the forefoot is slightly swollen. She denies any fever, chills, night sweats, nausea or vomiting.  Prescribed wound care regimen: Iodosorb Gel dressing daily.  Patient is using Darco shoe with PegAssist.  Patient relates wound has worsened.  Patient The patient is having no constitutional symptoms, denying fever, chills, anorexia, or weight loss..  Current Outpatient Medications on File Prior to Visit  Medication Sig Dispense Refill   acetaminophen-codeine (TYLENOL #3) 300-30 MG tablet 1 tablet as needed     ALPRAZolam (XANAX) 0.25 MG tablet Take 0.125 mg by mouth daily as needed for anxiety.      amLODipine (NORVASC) 2.5 MG tablet      amLODipine (NORVASC) 2.5 MG tablet Take 1 tablet by mouth daily.     amoxicillin (AMOXIL) 500 MG capsule Take 500 mg by mouth 3 (three) times daily.     betamethasone dipropionate (DIPROLENE) 0.05 % cream      Calcium Citrate-Vitamin D (CITRACAL + D PO) Take 1 tablet by mouth daily.     cephALEXin (KEFLEX) 500 MG capsule Take 500 mg by mouth 2 (two) times daily.     cholecalciferol (VITAMIN D) 1000 UNITS tablet Take 1,000 Units by mouth daily.     clobetasol cream (TEMOVATE) 0.05 %       cyclobenzaprine (FLEXERIL) 10 MG tablet TK 1/2 TO 1 T PO Q 8 H PRF MUSCLE SPASM     denosumab (PROLIA) 60 MG/ML SOLN injection Inject 60 mg into the skin every 6 (six) months. Administer in upper arm, thigh, or abdomen     diclofenac Sodium (VOLTAREN) 1 % GEL      diclofenac Sodium (VOLTAREN) 1 % GEL See admin instructions.     dicyclomine (BENTYL) 10 MG capsule Take 10 mg by mouth daily as needed for spasms.      fexofenadine (ALLEGRA ALLERGY) 180 MG tablet 1 tablet as needed     fish oil-omega-3 fatty acids 1000 MG capsule Take 1,200 mg by mouth daily.      fluorouracil (EFUDEX) 5 % cream Apply topically.     folic acid (FOLVITE) 1 MG tablet Take 2 mg by mouth daily.      hydrocortisone 2.5 % cream hydrocortisone 2.5 % topical cream     IBUPROFEN PO ibuprofen     levothyroxine (SYNTHROID) 150 MCG tablet 1 tablet on an empty stomach in the morning     levothyroxine (SYNTHROID, LEVOTHROID) 150 MCG tablet Take 150 mcg by mouth daily before breakfast.     loratadine (CLARITIN) 10 MG tablet Take by mouth.     Melatonin 2.5 MG CAPS Take 2.5 mg by mouth at bedtime as needed.      methotrexate (50 MG/ML) 1 G injection Inject 0.8 mg into  the vein once a week. Sunday     Methotrexate Sodium (METHOTREXATE, PF,) 50 MG/2ML injection      Multiple Vitamin (MULTIVITAMIN) capsule Take 1 capsule by mouth daily.     Multiple Vitamins-Minerals (MULTI FOR HER) TABS      naproxen (NAPRELAN) 500 MG 24 hr tablet Take 500 mg by mouth daily with breakfast.      naproxen (NAPROSYN) 500 MG tablet naproxen 500 mg tablet     neomycin-polymyxin-dexameth (MAXITROL) 0.1 % OINT neomycin 3.5 mg/g-polymyxin B 10,000 unit/g-dexameth 0.1 % eye oint  APPLY TO LIDS TWICE A DAY FOR 10 DAYS     Polyethylene Glycol 3350 (MIRALAX PO) Take by mouth See admin instructions. 3/4 a capful once daily     potassium chloride (K-DUR) 10 MEQ tablet Take by mouth.     potassium chloride SA (K-DUR,KLOR-CON) 20 MEQ tablet Take 20 mEq by mouth  daily.     predniSONE (STERAPRED UNI-PAK 21 TAB) 5 MG (21) TBPK tablet Take by mouth.     ranitidine (ZANTAC) 75 MG tablet Take 75 mg by mouth as needed for heartburn.     simvastatin (ZOCOR) 20 MG tablet Take 20 mg by mouth every evening.     simvastatin (ZOCOR) 40 MG tablet      simvastatin (ZOCOR) 40 MG tablet Take 0.5 tablets by mouth daily.     sodium chloride (OCEAN) 0.65 % nasal spray      triamcinolone cream (KENALOG) 0.5 % Apply topically.     triamterene-hydrochlorothiazide (MAXZIDE-25) 37.5-25 MG per tablet Take 1 tablet by mouth daily.     vitamin B-12 (CYANOCOBALAMIN) 500 MCG tablet Take by mouth.     vitamin B-12 (CYANOCOBALAMIN) 500 MCG tablet 1 tablet     No current facility-administered medications on file prior to visit.     Allergies  Allergen Reactions   Dilaudid [Hydromorphone Hcl] Nausea And Vomiting   Alendronate Other (See Comments)    Other reaction(s): Severe heartburn Other reaction(s): Severe heartburn   Azithromycin     Other reaction(s): made her feel bad   Dilaudid [Hydromorphone]     Other reaction(s): severe N V   Morphine And Related    Parathyroid Hormone (Recomb)     Other reaction(s): (radiation therapy for breast cancer)   Phenergan [Promethazine Hcl]    Promethazine     Other reaction(s): hallucinations Other reaction(s): hallucinations   Tamiflu [Oseltamivir Phosphate]     rash   Taxol [Paclitaxel]     rash   Tetanus Immune Globulin    Tetanus Toxoid, Adsorbed     Other reaction(s): localized reaction Other reaction(s): localized reaction   Tetanus Toxoids    Tramadol Other (See Comments)   Oseltamivir Rash    Other reaction(s): Rash Other reaction(s): Rash     Objective:  Physical Exam: There were no vitals filed for this visit.   [name] is a pleasant 88 y.o. Caucasian female WD, WN in NAD. AAO x 3.  Vascular Examination: Capillary refill time to digits immediate b/l. Palpable pedal pulses b/l LE. Pedal hair  sparse. No pain with calf compression b/l. Trace edema noted dorsally left forefoot.  Dermatological Examination: Pedal skin is warm and supple b/l LE. No interdigital macerations noted b/l LE. Toenails recently debrided.      Wound Location: submet head 2 left foot with fluctuant blister noted at proximal aspect of lesion. This is tender to palpation. There is no warmth.  There is a moderate amount of devitalized tissue  present in the wound. Predebridement Wound Measurement:  0.5  x 1.0 cm. Postdebridement Wound Measurement: 2.5 x 1.2 x 0.2 cm. Wound Base: Granular/Healthy Peri-wound: Normal Exudate: Scant/small amount seropurulent exudate with no odor. Blood Loss during debridement: less than 1 cc('s). Material in wound which inhibits healing/promotes adjacent tissue breakdown:  necrotic tissue. Description of tissue removed from ulceration today:  necrotic tissue. Sign(s) of clinical bacterial infection: seropurulent drainage  Musculoskeletal Examination: Muscle strength 5/5 to all lower extremity muscle groups bilaterally. Severe hammertoe deformity noted 2-5 bilaterally.  Neurological Examination: Protective sensation intact 5/5 intact bilaterally with 10g monofilament b/l. Vibratory sensation intact b/l.  Radiographs left foot:  No gas in tissues left foot. Bone erosion noted at site of ulceration left 2nd MPJ with flattening of base of proximal phalanx. No foreign body evident left foot. Decreased joint space noted left 2nd-4th MPJ.   Assessment:  1. Ulcer of left foot, unspecified ulcer stage ([facility]) - DG Foot Complete Left; Future - doxycycline (VIBRAMYCIN) 100 MG capsule; Take 1 capsule (100 mg total) by mouth 2 (two) times daily for 7 days.  Dispense: 14 capsule; Refill: 0 - WOUND CULTURE  2. Osteomyelitis of left foot, unspecified type ([place]) -Suspected osteomyelitis left 2nd metatarsal phalangeal joint given chronicity of ulcer and chronic immunosuppression. - MR  FOOT LEFT WO CONTRAST; Future   Plan:  -Patient was evaluated and treated and all questions answered.  -Patient/POA/Family member educated on diagnosis and treatment plan of routine ulcer debridement/wound care.  -Ulceration debridement achieved utilizing sharp excisional debridement  to healthy bleeding tissue with sterile scalpel blade.. Type/amount of devitalized tissue removed: necrotic tissue. -Today's ulcer size post-debridement: 2.5 x 1.2 x 0.2 cm. -Ulceration cleansed with wound cleanser. Iodosorb Gel applied to base of ulceration and secured with light dressing. -Wound responded well to today's debridement. -Patient risk factors affecting healing of ulcer: foot deformity, immunosuppressive medications, recurrent ulceration same location left foot. -[name] given written instructions on daily wound care for left foot ulceration. -Rx for Doxycyline 100 mg, #14, to be taken one capsule twice daily for 7 days. -Frequency of debridements needed to achieve healing:  -Wound culture and sensitivity ordered today for left foot ulcer. -Consultations ordered today:  -Radiology ordered today: X-Ray: left foot in office and MRI: left foot -Pedorthist adjusted insert for Darco shoe on today's visit. -Patient scheduled to see Dr. Lorenda Peck in one week for follow up of left foot ulcer with suspected osteomyelitis. -Patient/POA to call should there be question/concern in the interim. Return in about 5 weeks (around 11/19/2021).  Marzetta Board, DPM

## 2021-10-18 ENCOUNTER — Telehealth: Payer: Self-pay

## 2021-10-18 LAB — WOUND CULTURE
MICRO NUMBER:: 12821971
SPECIMEN QUALITY:: ADEQUATE

## 2021-10-18 LAB — HOUSE ACCOUNT TRACKING

## 2021-10-21 DIAGNOSIS — Z923 Personal history of irradiation: Secondary | ICD-10-CM | POA: Diagnosis not present

## 2021-10-21 DIAGNOSIS — M810 Age-related osteoporosis without current pathological fracture: Secondary | ICD-10-CM | POA: Diagnosis not present

## 2021-10-21 DIAGNOSIS — K219 Gastro-esophageal reflux disease without esophagitis: Secondary | ICD-10-CM | POA: Diagnosis not present

## 2021-10-21 DIAGNOSIS — Z8489 Family history of other specified conditions: Secondary | ICD-10-CM | POA: Diagnosis not present

## 2021-10-21 NOTE — Progress Notes (Signed)
SITUATION Reason for Consult: Follow-up with protective foot orthotics Patient / Caregiver Report: Patient reports feeling unbalanced in foot orthotics  OBJECTIVE DATA History / Diagnosis:    ICD-10-CM   1. Ulcer of left foot with fat layer exposed ([place])  L97.522       Change in Pathology: None  ACTIONS PERFORMED Patient's equipment was checked for structural stability and fit. Adjusted heel angle of foot orthotics and patient reported increase in comfort. Device(s) intact and fit is excellent. All questions answered and concerns addressed.  PLAN Follow-up as needed (PRN). Plan of care discussed with and agreed upon by patient / caregiver.

## 2021-10-22 ENCOUNTER — Other Ambulatory Visit: Payer: Self-pay

## 2021-10-22 ENCOUNTER — Ambulatory Visit (INDEPENDENT_AMBULATORY_CARE_PROVIDER_SITE_OTHER): Payer: Medicare Other | Admitting: Podiatry

## 2021-10-22 ENCOUNTER — Encounter: Payer: Self-pay | Admitting: Podiatry

## 2021-10-22 ENCOUNTER — Ambulatory Visit
Admission: RE | Admit: 2021-10-22 | Discharge: 2021-10-22 | Disposition: A | Discharge status: Home / Self Care | Payer: Medicare Other | Source: Ambulatory Visit | Attending: Podiatry | Admitting: Podiatry

## 2021-10-22 DIAGNOSIS — M2012 Hallux valgus (acquired), left foot: Secondary | ICD-10-CM | POA: Diagnosis not present

## 2021-10-22 DIAGNOSIS — M869 Osteomyelitis, unspecified: Secondary | ICD-10-CM

## 2021-10-22 DIAGNOSIS — M216X9 Other acquired deformities of unspecified foot: Secondary | ICD-10-CM

## 2021-10-22 DIAGNOSIS — L97521 Non-pressure chronic ulcer of other part of left foot limited to breakdown of skin: Secondary | ICD-10-CM

## 2021-10-22 DIAGNOSIS — M7989 Other specified soft tissue disorders: Secondary | ICD-10-CM | POA: Diagnosis not present

## 2021-10-22 DIAGNOSIS — M19072 Primary osteoarthritis, left ankle and foot: Secondary | ICD-10-CM | POA: Diagnosis not present

## 2021-10-22 DIAGNOSIS — M069 Rheumatoid arthritis, unspecified: Secondary | ICD-10-CM

## 2021-10-22 NOTE — Progress Notes (Signed)
°  Subjective:  Patient ID: [name], female    DOB: [birth year],   MRN: [id number]  Chief Complaint  Patient presents with   Callouses    Pt is following up on callus- Dr. Elisha Ponder has been treating callus- there seems to be improvement no more puss or bleeding- has 1 more tablet left of antibiotics- overall needing further evaluation     88 y.o. female presents for follow-up of infected callus that was referred to me by Dr. Elisha Ponder. Relates she has been doing well and dressing with iodosorb everyday. Relates she has one more day of antibiotics. Doing well. Has MRI this afternoon scheduled . Denies any other pedal complaints. Denies n/v/f/c.   Past Medical History:  Diagnosis Date   Abnormal cardiovascular stress test 12/2013   s/p cath with mild nonobstructive CAD normal EF - managed medically   Breast cancer ([place]) 2008   RT, chemo, lumpectomy   Diverticulitis    DVT (deep vein thrombosis) in pregnancy    site of PICC catheter   HTN (hypertension)    Hyperlipidemia    Hypothyroidism    Kidney tumor 2010   RFA   Personal history of chemotherapy    Personal history of radiation therapy    Prediabetes    Rheumatoid arthritis(714.0)    UTI (lower urinary tract infection)     Objective:  Physical Exam: Vascular: DP/PT pulses 2/4 bilateral. CFT <3 seconds. Normal hair growth on digits. No edema.  Skin. No lacerations or abrasions bilateral feet. Hyperkeratotic tissue noted sub 3rd metatarsal with small pinpoint area still open about 0.1 cm x 0.1 cm. Healthy granular base. No erythema edema or signs of infection.  Musculoskeletal: MMT 5/5 bilateral lower extremities in DF, PF, Inversion and Eversion. Deceased ROM in DF of ankle joint.  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  Neurological: Sensation intact to light touch.   Assessment:   1. Skin ulcer of left foot, limited to breakdown of skin ([place])   2. Plantar flexed metatarsal,  unspecified laterality   3. Rheumatoid arthritis of left foot, unspecified whether rheumatoid factor present [facility])      Plan:  Patient was evaluated and treated and all questions answered. Ulcer left plantar foot with limited to breakdown of skin. Nearly healed.   -Debridement as below. -Dressed with iodosorb, DSD. -Off-loading with surgical shoe. -No abx indicated.  -MRI this afternoon.  -Discussed glucose control and proper protein-rich diet.  -Discussed if any worsening redness, pain, fever or chills to call or may need to report to the emergency room. Patient expressed understanding.   Procedure: Excisional Debridement of Wound Rationale: Removal of non-viable soft tissue from the wound to promote healing.  Anesthesia: none Pre-Debridement Wound Measurements: Overlying callus  Post-Debridement Wound Measurements: 0.1 cm x 0.1 cm x 0.1 cm  Type of Debridement: Sharp Excisional Tissue Removed: Non-viable soft tissue Depth of Debridement: subcutaneous tissue. Technique: Sharp excisional debridement to bleeding, viable wound base.  Dressing: Dry, sterile, compression dressing. Disposition: Patient tolerated procedure well. Patient to return in 2 week for follow-up.  No follow-ups on file.   Lorenda Peck, DPM

## 2021-10-25 ENCOUNTER — Ambulatory Visit: Payer: Medicare Other | Admitting: Podiatry

## 2021-10-30 ENCOUNTER — Other Ambulatory Visit: Payer: Self-pay | Admitting: Family Medicine

## 2021-10-30 DIAGNOSIS — E2839 Other primary ovarian failure: Secondary | ICD-10-CM

## 2021-11-04 DIAGNOSIS — Z79899 Other long term (current) drug therapy: Secondary | ICD-10-CM | POA: Diagnosis not present

## 2021-11-04 DIAGNOSIS — E039 Hypothyroidism, unspecified: Secondary | ICD-10-CM | POA: Diagnosis not present

## 2021-11-04 DIAGNOSIS — I10 Essential (primary) hypertension: Secondary | ICD-10-CM | POA: Diagnosis not present

## 2021-11-04 DIAGNOSIS — R7303 Prediabetes: Secondary | ICD-10-CM | POA: Diagnosis not present

## 2021-11-04 DIAGNOSIS — M810 Age-related osteoporosis without current pathological fracture: Secondary | ICD-10-CM | POA: Diagnosis not present

## 2021-11-05 ENCOUNTER — Other Ambulatory Visit: Payer: Self-pay

## 2021-11-05 ENCOUNTER — Ambulatory Visit (INDEPENDENT_AMBULATORY_CARE_PROVIDER_SITE_OTHER): Payer: Medicare Other | Admitting: Podiatry

## 2021-11-05 ENCOUNTER — Encounter: Payer: Self-pay | Admitting: Podiatry

## 2021-11-05 DIAGNOSIS — L84 Corns and callosities: Secondary | ICD-10-CM | POA: Diagnosis not present

## 2021-11-05 DIAGNOSIS — M216X9 Other acquired deformities of unspecified foot: Secondary | ICD-10-CM | POA: Diagnosis not present

## 2021-11-05 DIAGNOSIS — Z872 Personal history of diseases of the skin and subcutaneous tissue: Secondary | ICD-10-CM | POA: Diagnosis not present

## 2021-11-05 NOTE — Progress Notes (Signed)
°  Subjective:  Patient ID: [name], female    DOB: [birth year],   MRN: [id number]  Chief Complaint  Patient presents with   Foot Ulcer    Pt is here to f/u ulcer on left planter foot/ Much better per pt.    88 y.o. female presents for follow-up  infected callus and to review MRI.  Relates she has been doing well and dressing with iodosorb everyday. Relates she feels it is healed.  Denies any other pedal complaints. Denies n/v/f/c.   Past Medical History:  Diagnosis Date   Abnormal cardiovascular stress test 12/2013   s/p cath with mild nonobstructive CAD normal EF - managed medically   Breast cancer ([place]) 2008   RT, chemo, lumpectomy   Diverticulitis    DVT (deep vein thrombosis) in pregnancy    site of PICC catheter   HTN (hypertension)    Hyperlipidemia    Hypothyroidism    Kidney tumor 2010   RFA   Personal history of chemotherapy    Personal history of radiation therapy    Prediabetes    Rheumatoid arthritis(714.0)    UTI (lower urinary tract infection)     Objective:  Physical Exam: Vascular: DP/PT pulses 2/4 bilateral. CFT <3 seconds. Normal hair growth on digits. No edema.  Skin. No lacerations or abrasions bilateral feet. Hyperkeratotic tissue noted sub 3rd metatarsal  Healthy granular base. No erythema edema or signs of infection.  Musculoskeletal: MMT 5/5 bilateral lower extremities in DF, PF, Inversion and Eversion. Deceased ROM in DF of ankle joint.  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  Neurological: Sensation intact to light touch.   Assessment:   1. Healed ulcer of left foot   2. Plantar flexed metatarsal, unspecified laterality   3. Pre-ulcerative calluses       Plan:  Patient was evaluated and treated and all questions answered. Ulcer left plantar foot with limited to breakdown of skin.-Healed  --Overylying hyperkeratotic tissue debrided. Pre-ulcerative callus  -Off-loading with surgical shoe. May  transition back to regular shoe.  -No abx indicated.  -MRI reviewed and showed no osteomyelitis.  -Discussed glucose control and proper protein-rich diet.  -Discussed if any worsening redness, pain, fever or chills to call or may need to report to the emergency room. Patient expressed understanding.  -Follow-up with Dr. Elisha Ponder in a couple weeks.   Return if symptoms worsen or fail to improve.   Lorenda Peck, DPM

## 2021-11-05 NOTE — Telephone Encounter (Signed)
See encounter below.  

## 2021-11-06 DIAGNOSIS — Z6822 Body mass index (BMI) 22.0-22.9, adult: Secondary | ICD-10-CM | POA: Diagnosis not present

## 2021-11-06 DIAGNOSIS — M150 Primary generalized (osteo)arthritis: Secondary | ICD-10-CM | POA: Diagnosis not present

## 2021-11-06 DIAGNOSIS — Z79899 Other long term (current) drug therapy: Secondary | ICD-10-CM | POA: Diagnosis not present

## 2021-11-06 DIAGNOSIS — M2550 Pain in unspecified joint: Secondary | ICD-10-CM | POA: Diagnosis not present

## 2021-11-06 DIAGNOSIS — M0579 Rheumatoid arthritis with rheumatoid factor of multiple sites without organ or systems involvement: Secondary | ICD-10-CM | POA: Diagnosis not present

## 2021-11-11 DIAGNOSIS — Z85828 Personal history of other malignant neoplasm of skin: Secondary | ICD-10-CM | POA: Diagnosis not present

## 2021-11-11 DIAGNOSIS — L578 Other skin changes due to chronic exposure to nonionizing radiation: Secondary | ICD-10-CM | POA: Diagnosis not present

## 2021-11-11 DIAGNOSIS — L723 Sebaceous cyst: Secondary | ICD-10-CM | POA: Diagnosis not present

## 2021-11-11 DIAGNOSIS — L570 Actinic keratosis: Secondary | ICD-10-CM | POA: Diagnosis not present

## 2021-11-19 ENCOUNTER — Other Ambulatory Visit: Payer: Self-pay

## 2021-11-19 ENCOUNTER — Ambulatory Visit (INDEPENDENT_AMBULATORY_CARE_PROVIDER_SITE_OTHER): Payer: Medicare Other | Admitting: Podiatry

## 2021-11-19 ENCOUNTER — Encounter: Payer: Self-pay | Admitting: Podiatry

## 2021-11-19 DIAGNOSIS — M069 Rheumatoid arthritis, unspecified: Secondary | ICD-10-CM

## 2021-11-19 DIAGNOSIS — Z8673 Personal history of transient ischemic attack (TIA), and cerebral infarction without residual deficits: Secondary | ICD-10-CM | POA: Insufficient documentation

## 2021-11-19 DIAGNOSIS — M439 Deforming dorsopathy, unspecified: Secondary | ICD-10-CM | POA: Insufficient documentation

## 2021-11-19 DIAGNOSIS — L97521 Non-pressure chronic ulcer of other part of left foot limited to breakdown of skin: Secondary | ICD-10-CM | POA: Diagnosis not present

## 2021-11-24 NOTE — Progress Notes (Signed)
Subjective: [name] is a 88 y.o. female patient seen today for follow up ulcer plantar aspect of left foot .   Patient states her foot really feels good now. Patient had her MRI study done, has completed oral antibiotics and followed up with Dr.Sikora. MRI results were negative for osteomyelitis of the left foot..  New problem(s)/concern(s) today: None    PCP is Jonathon Jordan, MD. Last visit was: 08/19/2021.  Allergies  Allergen Reactions   Dilaudid [Hydromorphone Hcl] Nausea And Vomiting   Alendronate Other (See Comments)    Other reaction(s): Severe heartburn Other reaction(s): Severe heartburn   Alendronate Sodium     Other reaction(s): Severe heartburn   Azithromycin     Other reaction(s): made her feel bad   Hydromorphone     Other reaction(s): severe N V Other reaction(s): severe N V   Morphine And Related    Paclitaxel     rash Other reaction(s): rash   Parathyroid Hormone (Recomb)     Other reaction(s): (radiation therapy for breast cancer)   Promethazine     Other reaction(s): hallucinations Other reaction(s): hallucinations   Promethazine Hcl     Other reaction(s): hallucinations   Tamiflu [Oseltamivir Phosphate]     rash   Tetanus Immune Globulin    Tetanus Toxoid     Other reaction(s): localized reaction   Tetanus Toxoid, Adsorbed     Other reaction(s): localized reaction Other reaction(s): localized reaction   Tetanus Toxoids    Tramadol Other (See Comments)   Oseltamivir Rash    Other reaction(s): Rash Other reaction(s): Rash Other reaction(s): Rash    Objective: Physical Exam  General: Patient is a pleasant 88 y.o. Caucasian female WD, WN in NAD. AAO x 3.   Neurovascular Examination: CFT immediate b/l LE. Palpable DP/PT pulses b/l LE. Digital hair sparse b/l. Skin temperature gradient WNL b/l. No pain with calf compression b/l. No edema noted b/l. No cyanosis or clubbing noted b/l LE.  Protective sensation intact 5/5 intact bilaterally  with 10g monofilament b/l. Vibratory sensation intact b/l.  Dermatological:  Pedal integument with normal turgor, texture and tone BLE. No open wounds b/l LE. No interdigital macerations noted b/l LE. Toenails recently debrided. Preulcerative lesion noted submet head 3 left foot. There is visible subdermal hemorrhage. There is no surrounding erythema, no edema, no drainage, no odor, no fluctuance.  Musculoskeletal:  Normal muscle strength 5/5 to all lower extremity muscle groups bilaterally. Severe hammertoe deformity noted 2-5 bilaterally.. No pain, crepitus or joint limitation noted with ROM b/l LE.  Patient ambulates independently without assistive aids.  Assessment: 1. Skin ulcer of left foot, limited to breakdown of skin ([place])   2. Rheumatoid arthritis of left foot, unspecified whether rheumatoid factor present [facility])    Plan: Patient was evaluated and treated and all questions answered. Consent given for treatment as described below: -Patient with h/o RA, chronic immunosuppression, chronic left foot ulceration which waxes and wanes, She has had recent MRI study of left foot negative for osteomyelitis. Continue offloading and monthly visits to prevent recurrence. -Preulcerative lesion pared submet head 2 left foot. Total number pared=1. -Patient/POA to call should there be question/concern in the interim.  Return in about 3 weeks (around 12/10/2021).  Marzetta Board, DPM

## 2021-12-02 DIAGNOSIS — H6123 Impacted cerumen, bilateral: Secondary | ICD-10-CM | POA: Diagnosis not present

## 2021-12-03 ENCOUNTER — Other Ambulatory Visit: Payer: Medicare Other

## 2021-12-05 DIAGNOSIS — N6320 Unspecified lump in the left breast, unspecified quadrant: Secondary | ICD-10-CM | POA: Diagnosis not present

## 2021-12-11 ENCOUNTER — Other Ambulatory Visit: Payer: Self-pay

## 2021-12-11 ENCOUNTER — Other Ambulatory Visit: Payer: Self-pay | Admitting: Family Medicine

## 2021-12-11 ENCOUNTER — Encounter: Payer: Self-pay | Admitting: Podiatry

## 2021-12-11 ENCOUNTER — Ambulatory Visit (INDEPENDENT_AMBULATORY_CARE_PROVIDER_SITE_OTHER): Payer: Medicare Other | Admitting: Podiatry

## 2021-12-11 DIAGNOSIS — M216X9 Other acquired deformities of unspecified foot: Secondary | ICD-10-CM | POA: Diagnosis not present

## 2021-12-11 DIAGNOSIS — Z872 Personal history of diseases of the skin and subcutaneous tissue: Secondary | ICD-10-CM | POA: Diagnosis not present

## 2021-12-11 DIAGNOSIS — M069 Rheumatoid arthritis, unspecified: Secondary | ICD-10-CM

## 2021-12-11 DIAGNOSIS — N6310 Unspecified lump in the right breast, unspecified quadrant: Secondary | ICD-10-CM

## 2021-12-16 NOTE — Progress Notes (Signed)
Subjective: ?[name] is a 88 y.o. female patient seen today for follow up ulcer plantar aspect of left foot . Patient states her foot is a little tender today. Has not noticed any redness, drainage, swelling or breakdown. ? ?Patient with h/o RA, chronic immunosuppression, chronic left foot ulceration which waxes and wanes, She has had recent MRI study of left foot negative for osteomyelitis.  ? ?New problem(s)/concern(s) today: None   ? ?PCP is Jonathon Jordan, MD. Last visit was: 12/05/2021. ? ?Allergies  ?Allergen Reactions  ? Dilaudid [Hydromorphone Hcl] Nausea And Vomiting  ? Alendronate Other (See Comments)  ?  Other reaction(s): Severe heartburn ?Other reaction(s): Severe heartburn  ? Alendronate Sodium   ?  Other reaction(s): Severe heartburn  ? Azithromycin   ?  Other reaction(s): made her feel bad  ? Hydromorphone   ?  Other reaction(s): severe N V ?Other reaction(s): severe N V  ? Morphine And Related   ? Paclitaxel   ?  rash ?Other reaction(s): rash  ? Parathyroid Hormone (Recomb)   ?  Other reaction(s): (radiation therapy for breast cancer)  ? Promethazine   ?  Other reaction(s): hallucinations ?Other reaction(s): hallucinations  ? Promethazine Hcl   ?  Other reaction(s): hallucinations  ? Tamiflu [Oseltamivir Phosphate]   ?  rash  ? Tetanus Immune Globulin   ? Tetanus Toxoid   ?  Other reaction(s): localized reaction  ? Tetanus Toxoid, Adsorbed   ?  Other reaction(s): localized reaction ?Other reaction(s): localized reaction  ? Tetanus Toxoids   ? Tramadol Other (See Comments)  ? Oseltamivir Rash  ?  Other reaction(s): Rash ?Other reaction(s): Rash ?Other reaction(s): Rash  ? ? ?Objective: ?Physical Exam ? ?General: Patient is a pleasant 88 y.o. Caucasian female WD, WN in NAD. AAO x 3.  ? ?Neurovascular Examination: ?CFT immediate b/l LE. Palpable DP/PT pulses b/l LE. Digital hair sparse b/l. Skin temperature gradient WNL b/l. No pain with calf compression b/l. No edema noted b/l. No cyanosis or  clubbing noted b/l LE. ? ?Protective sensation intact 5/5 intact bilaterally with 10g monofilament b/l. Vibratory sensation intact b/l. ? ?Dermatological:  ?Pedal integument with normal turgor, texture and tone BLE. No open wounds b/l LE. No interdigital macerations noted b/l LE. Toenails recently debrided. Preulcerative lesion noted submet head 3 left foot. There is visible subdermal hemorrhage, but now decreased compared to previous visits. There is no surrounding erythema, no edema, no drainage, no odor, no fluctuance. Slight tenderness to palpation. ? ?Musculoskeletal:  ?Normal muscle strength 5/5 to all lower extremity muscle groups bilaterally. Severe hammertoe deformity noted 2-5 bilaterally.. No pain, crepitus or joint limitation noted with ROM b/l LE.  Patient ambulates independently without assistive aids. ? ?Assessment: ?1. Healed ulcer of left foot   ?2. Rheumatoid arthritis of left foot, unspecified whether rheumatoid factor present ([place])   ?3. Plantar flexed metatarsal, unspecified laterality   ? ?Plan: ?Patient was evaluated and treated and all questions answered. ?Consent given for treatment as described below: ?-Gentle debridement of left foot. Continue offloading of lesion left foot. Call if there are any changes in the foot. ?-Patient/POA to call should there be question/concern in the interim. ? ?Return in about 2 weeks (around 12/25/2021). ? ?Marzetta Board, DPM ?

## 2021-12-20 ENCOUNTER — Ambulatory Visit
Admission: RE | Admit: 2021-12-20 | Discharge: 2021-12-20 | Disposition: A | Discharge status: Home / Self Care | Payer: Medicare Other | Source: Ambulatory Visit | Attending: Family Medicine | Admitting: Family Medicine

## 2021-12-20 ENCOUNTER — Other Ambulatory Visit: Payer: Self-pay | Admitting: Family Medicine

## 2021-12-20 ENCOUNTER — Other Ambulatory Visit: Payer: Self-pay

## 2021-12-20 DIAGNOSIS — R922 Inconclusive mammogram: Secondary | ICD-10-CM | POA: Diagnosis not present

## 2021-12-20 DIAGNOSIS — N6321 Unspecified lump in the left breast, upper outer quadrant: Secondary | ICD-10-CM | POA: Diagnosis not present

## 2021-12-20 DIAGNOSIS — N6310 Unspecified lump in the right breast, unspecified quadrant: Secondary | ICD-10-CM

## 2021-12-20 DIAGNOSIS — M810 Age-related osteoporosis without current pathological fracture: Secondary | ICD-10-CM | POA: Diagnosis not present

## 2021-12-25 ENCOUNTER — Ambulatory Visit (INDEPENDENT_AMBULATORY_CARE_PROVIDER_SITE_OTHER): Payer: Medicare Other | Admitting: Podiatry

## 2021-12-25 ENCOUNTER — Encounter: Payer: Self-pay | Admitting: Podiatry

## 2021-12-25 ENCOUNTER — Other Ambulatory Visit: Payer: Self-pay

## 2021-12-25 DIAGNOSIS — L97521 Non-pressure chronic ulcer of other part of left foot limited to breakdown of skin: Secondary | ICD-10-CM | POA: Diagnosis not present

## 2021-12-30 NOTE — Progress Notes (Signed)
?  Subjective:  ?Patient ID: [name], female    DOB: 1934-08-28,  MRN: [id number] ? ?[name] presents to clinic today for follow up of ulcer plantar aspect left foot. Patient has h/o RA, and is on immunosuppressive medication. ? ?Patient states her left foot is a little sore yesterday. States she wore thinner socks with different shoes. She was on her feet quite a bit. Today, she is wearing her Darco shoe. ? ?New problem(s): None.  ? ?PCP is Jonathon Jordan, MD , and last visit was December 05, 2021. ? ?Allergies  ?Allergen Reactions  ? Dilaudid [Hydromorphone Hcl] Nausea And Vomiting  ? Alendronate Other (See Comments)  ?  Other reaction(s): Severe heartburn ?Other reaction(s): Severe heartburn  ? Alendronate Sodium   ?  Other reaction(s): Severe heartburn  ? Azithromycin   ?  Other reaction(s): made her feel bad  ? Hydromorphone   ?  Other reaction(s): severe N V ?Other reaction(s): severe N V  ? Morphine And Related   ? Paclitaxel   ?  rash ?Other reaction(s): rash  ? Parathyroid Hormone (Recomb)   ?  Other reaction(s): (radiation therapy for breast cancer)  ? Promethazine   ?  Other reaction(s): hallucinations ?Other reaction(s): hallucinations  ? Promethazine Hcl   ?  Other reaction(s): hallucinations  ? Tamiflu [Oseltamivir Phosphate]   ?  rash  ? Tetanus Immune Globulin   ? Tetanus Toxoid   ?  Other reaction(s): localized reaction  ? Tetanus Toxoid, Adsorbed   ?  Other reaction(s): localized reaction ?Other reaction(s): localized reaction  ? Tetanus Toxoids   ? Tramadol Other (See Comments)  ? Oseltamivir Rash  ?  Other reaction(s): Rash ?Other reaction(s): Rash ?Other reaction(s): Rash  ?Review of Systems: Negative except as noted in the HPI. ? ?Objective: General: Patient is a pleasant 88 y.o. Caucasian female WD, WN in NAD. AAO x 3.  ? ?Neurovascular Examination: ?CFT immediate b/l LE. Palpable DP/PT pulses b/l LE. Digital hair sparse b/l. Skin temperature gradient WNL b/l. No pain with calf  compression b/l. No edema noted b/l. No cyanosis or clubbing noted b/l LE. ? ?Protective sensation intact 5/5 intact bilaterally with 10g monofilament b/l. Vibratory sensation intact b/l. ? ?Dermatological:  ?Pedal integument with normal turgor, texture and tone BLE. No open wounds b/l LE. No interdigital macerations noted b/l LE. Toenails recently debrided. Preulcerative lesion noted submet head 3 left foot. There is visible subdermal hemorrhage. Post debridement reveals partial thickness ulcer. There is no surrounding erythema, no edema, no drainage, no odor, no fluctuance. Slight tenderness to palpation. ? ?Musculoskeletal:  ?Normal muscle strength 5/5 to all lower extremity muscle groups bilaterally. Severe hammertoe deformity noted 2-5 bilaterally.. No pain, crepitus or joint limitation noted with ROM b/l LE.  Patient ambulates independently without assistive aids. ? ?Assessment/Plan: ?1. Skin ulcer of left foot, limited to breakdown of skin ([place])   ?2. Rheumatoid arthritis of left foot, unspecified whether rheumatoid factor present ([place])   ?  ?-Partial thickness ulcer pared submet head 3 left foot. Ulcer cleansed with wound cleanser. Iodosorb Gel and light dressing applied. Continue Iodosorb Gel dressing changes daily. Continue Darco shoe.. ?-Patient/POA to call should there be question/concern in the interim.  ? ?Return in about 3 weeks (around 01/15/2022). ? ?Marzetta Board, DPM  ?

## 2022-01-15 ENCOUNTER — Encounter: Payer: Self-pay | Admitting: Podiatry

## 2022-01-15 ENCOUNTER — Ambulatory Visit (INDEPENDENT_AMBULATORY_CARE_PROVIDER_SITE_OTHER): Payer: Medicare Other

## 2022-01-15 ENCOUNTER — Ambulatory Visit (INDEPENDENT_AMBULATORY_CARE_PROVIDER_SITE_OTHER): Payer: Medicare Other | Admitting: Podiatry

## 2022-01-15 ENCOUNTER — Telehealth (INDEPENDENT_AMBULATORY_CARE_PROVIDER_SITE_OTHER): Payer: Medicare Other | Admitting: Podiatry

## 2022-01-15 DIAGNOSIS — M069 Rheumatoid arthritis, unspecified: Secondary | ICD-10-CM

## 2022-01-15 DIAGNOSIS — L97522 Non-pressure chronic ulcer of other part of left foot with fat layer exposed: Secondary | ICD-10-CM

## 2022-01-15 MED ORDER — DOXYCYCLINE HYCLATE 100 MG PO CAPS: 100.0000 mg | ORAL_CAPSULE | Freq: Two times a day (BID) | ORAL | 0 refills | Status: AC

## 2022-01-15 NOTE — Progress Notes (Signed)
?Subjective:  ?Patient ID: [name], female    DOB: 1934-07-23,  MRN: [id number] ? ?AMA MCMASTER presents to clinic today for at risk foot care. Patient has history of rheumatoid arthritis and chronic lesion plantar aspect of left foot which at times develops an ulceration. She has had an MRI of the left foot which was negative for osteomyelitis   At home, she often wears an offloaded Darco shoe on the left foot. ? ?She has seen several doctors in our office.  ? ?New problem(s): Left foot is painful today. She denies any redness. She thinks the area is swollen. She has not noticed any drainage.  ? ?PCP is Jonathon Jordan, MD , and last visit was December 05, 2021. ? ?Allergies  ?Allergen Reactions  ? Dilaudid [Hydromorphone Hcl] Nausea And Vomiting  ? Alendronate Other (See Comments)  ?  Other reaction(s): Severe heartburn ?Other reaction(s): Severe heartburn  ? Alendronate Sodium   ?  Other reaction(s): Severe heartburn  ? Azithromycin   ?  Other reaction(s): made her feel bad ?Other reaction(s): made her feel bad  ? Hydromorphone   ?  Other reaction(s): severe N V ?Other reaction(s): severe N V  ? Morphine And Related   ? Morphine Sulfate   ?  Other reaction(s): vomitting  ? Paclitaxel   ?  rash ?Other reaction(s): rash  ? Parathyroid Hormone (Recomb)   ?  Other reaction(s): (radiation therapy for breast cancer)  ? Promethazine   ?  Other reaction(s): hallucinations ?Other reaction(s): hallucinations  ? Promethazine Hcl   ?  Other reaction(s): hallucinations  ? Tamiflu [Oseltamivir Phosphate]   ?  rash  ? Tetanus Immune Globulin   ? Tetanus Toxoid   ?  Other reaction(s): localized reaction  ? Tetanus Toxoid, Adsorbed   ?  Other reaction(s): localized reaction ?Other reaction(s): localized reaction  ? Tetanus Toxoids   ? Tramadol Other (See Comments)  ? Oseltamivir Rash  ?  Other reaction(s): Rash ?Other reaction(s): Rash ?Other reaction(s): Rash  ? ? ?Review of Systems: Negative except as noted in the  HPI. ? ?Objective: No changes noted in today's physical examination. ?Objective: General: Patient is a pleasant 88 y.o. Caucasian female WD, WN in NAD. AAO x 3.  ? ?Neurovascular Examination: ?CFT immediate b/l LE. Palpable DP/PT pulses b/l LE. Digital hair sparse b/l. Skin temperature gradient WNL b/l. No pain with calf compression b/l. No edema noted b/l. No cyanosis or clubbing noted b/l LE. ? ?Protective sensation intact 5/5 intact bilaterally with 10g monofilament b/l. Vibratory sensation intact b/l. ? ?Dermatological:  ? ? ?Pedal integument with thin and atrophied skin. No open wounds b/l LE. No interdigital macerations noted b/l LE. Toenails recently debrided.  ? ?Wound Location: submet head 3 left foot with fluctuant blister noted at proximal aspect of lesion. This is tender to palpation. There is no warmth.  ?There is a minor amount of devitalized tissue present in the wound. ?Predebridement Wound Measurement:  0.3  x 0.5 cm with subdermal fluctuance. ?Postdebridement Wound Measurement: 0.5 x 0.7 x 0.2 cm to level of subcutaneous tissue. ?Wound Base: Granular/Healthy ?Peri-wound: Normal ?Exudate: Scant/small amount clear, serous fluid <1/2 cc ?Blood Loss during debridement: None ?Material in wound which inhibits healing/promotes adjacent tissue breakdown:  necrotic tissue. ?Description of tissue removed from ulceration today:  necrotic tissue. ?Sign(s) of clinical bacterial infection: None ? ?Musculoskeletal:  ?Normal muscle strength 5/5 to all lower extremity muscle groups bilaterally. Severe hammertoe deformity noted 2-5 bilaterally.. No pain, crepitus  or joint limitation noted with ROM b/l LE.  Patient ambulates independently without assistive aids. ? ?Xray findings left foot: compared to xray October 15, 2021. ?No gas in tissues left foot. ?-No change in 3rd metatarsal compared to January 3rd left foot xray. ?No bone erosion noted at location of ulceration left foot. ?No evidence of fracture left  foot. ?No foreign body evident left foot. ?-Fibular deviation of digits 1-5 left foot with MPJ.  ? ?Assessment/Plan: ?1. Ulcer of left foot with fat layer exposed ([place])   ?2. Rheumatoid arthritis of left foot, unspecified whether rheumatoid factor present ([place])   ?-Patient/POA to call should there be question/concern in the interim.  ?Plan: ?-Patient was evaluated and treated and all questions answered.  ?-Patient/POA/Family member educated on diagnosis and treatment plan of routine ulcer debridement/wound care.  ?-Ulceration debridement achieved utilizing sharp excisional debridement with sterile scalpel blade.. Type/amount of devitalized tissue removed: necrotic tissue ?-Today's ulcer size post-debridement: 0.5 x 0.7 x 0.2 cm. ?-Ulceration cleansed with wound cleanser. Iodosorb Gel applied to base of ulceration and secured with light dressing. ?-Wound responded well to today's debridement. ?-Patient risk factors affecting healing of ulcer: history of foot/leg ulcer, foot deformity, RA, immunosuppressive medications ?-[name] given written instructions on daily wound care for left foot ulceration. ?-Rx for Doxycyline 100 mg, #20, to be taken one capsule twice daily  for 10 days for coverage since she is on immunosuppressive therapy for RA. ?-Frequency of debridements needed to achieve healing: ?-Radiology ordered today: X-Ray: left foot. No change noted when compared to January 3rd xray. ?-We did have a discussion regarding she consider surgical correction of her left rheumatoid foot. She is apprehensive as her husband is in [facility] and she needs to be available if the facility calls. She has two children. Her son lives out of state and her daughter lives out of the country. I have asked her to discuss with her family as chronic ulcerations with immunosuppressive therapy places her at risk for osteomyelitis. She related understanding. Her last MRI was negative for osteomyelitis. ?-Patient  scheduled to see Dr. Lorenda Peck in one week for follow up of left foot ulcer. ?-Patient/POA to call should there be question/concern in the interim. ? ?Return in about 1 week (around 01/22/2022). ? ?Marzetta Board, DPM  ?

## 2022-01-15 NOTE — Telephone Encounter (Signed)
Phoned [name] to inform her left foot xray appears unchanged when compared to October 15, 2021 xray. She has dressing change instructions and Rx for doxycycline. She will follow up with Dr. Blenda Mounts in one week for ulcer plantar aspect of the left foot. She related understanding. ?

## 2022-01-15 NOTE — Patient Instructions (Signed)
DRESSING CHANGES left foot:  ? ?PHARMACY SHOPPING LIST: ?Saline or Wound Cleanser for cleaning wound ?2 x 2 inch sterile gauze for cleaning wound ?Iodosorb Gel ? ?A. IF DISPENSED, WEAR SURGICAL SHOE OR WALKING BOOT AT ALL TIMES. ? ?B. IF PRESCRIBED ORAL ANTIBIOTICS, TAKE ALL MEDICATION AS PRESCRIBED UNTIL ALL ARE GONE. ? ?C. IF DOCTOR HAS DESIGNATED NONWEIGHTBEARING STATUS, PLEASE ADHERE TO INSTRUCTIONS. ? ?1. KEEP left foot DRY AT ALL TIMES!!!! ? ?2. CLEANSE ULCER WITH SALINE OR WOUND CLEANSER. ? ?3. DAB DRY WITH GAUZE SPONGE. ? ?4. APPLY A LIGHT AMOUNT OF Iodosorb Gel TO BASE OF ULCER. ? ?5. APPLY OUTER DRESSING AS INSTRUCTED. ? ?6. WEAR SURGICAL SHOE/BOOT DAILY AT ALL TIMES. IF SUPPLIED, WEAR HEEL PROTECTORS AT ALL TIMES WHEN IN BED. ? ?7. DO NOT WALK BAREFOOT!!! ? ?8.  IF YOU EXPERIENCE ANY FEVER, CHILLS, NIGHTSWEATS, NAUSEA OR VOMITING, ELEVATED OR LOW BLOOD SUGARS, REPORT TO EMERGENCY ROOM. ? ?9. IF YOU EXPERIENCE INCREASED REDNESS, PAIN, SWELLING, DISCOLORATION, ODOR, PUS, DRAINAGE OR WARMTH OF YOUR FOOT, REPORT TO EMERGENCY ROOM.  ?

## 2022-01-21 ENCOUNTER — Ambulatory Visit (INDEPENDENT_AMBULATORY_CARE_PROVIDER_SITE_OTHER): Payer: Medicare Other | Admitting: Podiatry

## 2022-01-21 ENCOUNTER — Encounter: Payer: Self-pay | Admitting: Podiatry

## 2022-01-21 DIAGNOSIS — L97521 Non-pressure chronic ulcer of other part of left foot limited to breakdown of skin: Secondary | ICD-10-CM

## 2022-01-21 DIAGNOSIS — M216X9 Other acquired deformities of unspecified foot: Secondary | ICD-10-CM

## 2022-01-21 DIAGNOSIS — M069 Rheumatoid arthritis, unspecified: Secondary | ICD-10-CM | POA: Diagnosis not present

## 2022-01-21 NOTE — Progress Notes (Signed)
?  Subjective:  ?Patient ID: [name], female    DOB: 1934/04/04,   MRN: [id number] ? ?Chief Complaint  ?Patient presents with  ? Foot Ulcer  ? ? ?88 y.o. female presents for follow-up of wound that was referred to me by Dr. Elisha Ponder. Relates she has been doing well and dressing with iodosorb everyday. Has finished antibotics. . Doing well.. Denies any other pedal complaints. Denies n/v/f/c.  ? ?Past Medical History:  ?Diagnosis Date  ? Abnormal cardiovascular stress test 12/2013  ? s/p cath with mild nonobstructive CAD normal EF - managed medically  ? Breast cancer ([place]) 2008  ? RT, chemo, lumpectomy  ? Diverticulitis   ? DVT (deep vein thrombosis) in pregnancy   ? site of PICC catheter  ? HTN (hypertension)   ? Hyperlipidemia   ? Hypothyroidism   ? Kidney tumor 2010  ? RFA  ? Personal history of chemotherapy   ? Personal history of radiation therapy   ? Prediabetes   ? Rheumatoid arthritis(714.0)   ? UTI (lower urinary tract infection)   ? ? ?Objective:  ?Physical Exam: ?Vascular: DP/PT pulses 2/4 bilateral. CFT <3 seconds. Normal hair growth on digits. No edema.  ?Skin. No lacerations or abrasions bilateral feet. Hyperkeratotic tissue noted sub 3rd metatarsal with small pinpoint area still open about 0.2 cm x 0.1 cm. Healthy granular base. No erythema edema or signs of infection.  ?Musculoskeletal: MMT 5/5 bilateral lower extremities in DF, PF, Inversion and Eversion. Deceased ROM in DF of ankle joint.  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  ?Neurological: Sensation intact to light touch.  ? ?Assessment:  ? ?1. Skin ulcer of left foot, limited to breakdown of skin ([employer])   ?2. Rheumatoid arthritis of left foot, unspecified whether rheumatoid factor present ([place])   ?3. Plantar flexed metatarsal, unspecified laterality   ? ? ? ? ?Plan:  ?Patient was evaluated and treated and all questions answered. ?Ulcer left plantar foot with limited to breakdown of skin. Nearly  healed.   ?-Debridement as below. ?-Dressed withbetadine, DSD. ?-Off-loading with surgical shoe. ?-No abx indicated. .  ?-Discussed glucose control and proper protein-rich diet.  ?-Discussed if any worsening redness, pain, fever or chills to call or may need to report to the emergency room. Patient expressed understanding.  ? ?Procedure: Excisional Debridement of Wound ?Rationale: Removal of non-viable soft tissue from the wound to promote healing.  ?Anesthesia: none ?Pre-Debridement Wound Measurements: Overlying callus  ?Post-Debridement Wound Measurements: 0.2 cm x 0.1 cm x 0.1 cm  ?Type of Debridement: Sharp Excisional ?Tissue Removed: Non-viable soft tissue ?Depth of Debridement: subcutaneous tissue. ?Technique: Sharp excisional debridement to bleeding, viable wound base.  ?Dressing: Dry, sterile, compression dressing. ?Disposition: Patient tolerated procedure well. Patient to return in 2 week for follow-up. ? ?Return in about 2 weeks (around 02/04/2022) for wound check. ? ? ?Lorenda Peck, DPM  ? ? ?

## 2022-01-22 ENCOUNTER — Ambulatory Visit: Payer: Medicare Other | Admitting: Podiatry

## 2022-01-22 DIAGNOSIS — M5451 Vertebrogenic low back pain: Secondary | ICD-10-CM | POA: Diagnosis not present

## 2022-01-24 DIAGNOSIS — R5383 Other fatigue: Secondary | ICD-10-CM | POA: Diagnosis not present

## 2022-01-24 DIAGNOSIS — R6883 Chills (without fever): Secondary | ICD-10-CM | POA: Diagnosis not present

## 2022-01-24 DIAGNOSIS — Z03818 Encounter for observation for suspected exposure to other biological agents ruled out: Secondary | ICD-10-CM | POA: Diagnosis not present

## 2022-01-24 DIAGNOSIS — R519 Headache, unspecified: Secondary | ICD-10-CM | POA: Diagnosis not present

## 2022-01-24 DIAGNOSIS — R110 Nausea: Secondary | ICD-10-CM | POA: Diagnosis not present

## 2022-01-24 DIAGNOSIS — B349 Viral infection, unspecified: Secondary | ICD-10-CM | POA: Diagnosis not present

## 2022-01-28 DIAGNOSIS — M5416 Radiculopathy, lumbar region: Secondary | ICD-10-CM | POA: Diagnosis not present

## 2022-01-31 DIAGNOSIS — M5451 Vertebrogenic low back pain: Secondary | ICD-10-CM | POA: Diagnosis not present

## 2022-02-04 ENCOUNTER — Ambulatory Visit: Payer: Medicare Other | Admitting: Podiatry

## 2022-02-05 ENCOUNTER — Ambulatory Visit (INDEPENDENT_AMBULATORY_CARE_PROVIDER_SITE_OTHER): Payer: Medicare Other | Admitting: Podiatry

## 2022-02-05 ENCOUNTER — Encounter: Payer: Self-pay | Admitting: Podiatry

## 2022-02-05 DIAGNOSIS — L84 Corns and callosities: Secondary | ICD-10-CM | POA: Diagnosis not present

## 2022-02-05 DIAGNOSIS — L97521 Non-pressure chronic ulcer of other part of left foot limited to breakdown of skin: Secondary | ICD-10-CM

## 2022-02-05 DIAGNOSIS — M216X9 Other acquired deformities of unspecified foot: Secondary | ICD-10-CM

## 2022-02-05 DIAGNOSIS — M069 Rheumatoid arthritis, unspecified: Secondary | ICD-10-CM

## 2022-02-05 NOTE — Progress Notes (Signed)
?  Subjective:  ?Patient ID: [name], female    DOB: [birth year],   MRN: [id number] ? ?Chief Complaint  ?Patient presents with  ? Wound Check  ?  Left foot wound check  ? ? ?88 y.o. female presents for follow-up of wound that was referred to me by Dr. Elisha Ponder. Relates she has been doing well and dressing with iodosorb everyday. States it has healed. Doing well.. Denies any other pedal complaints. Denies n/v/f/c.  ? ?Past Medical History:  ?Diagnosis Date  ? Abnormal cardiovascular stress test 12/2013  ? s/p cath with mild nonobstructive CAD normal EF - managed medically  ? Breast cancer ([place]) 2008  ? RT, chemo, lumpectomy  ? Diverticulitis   ? DVT (deep vein thrombosis) in pregnancy   ? site of PICC catheter  ? HTN (hypertension)   ? Hyperlipidemia   ? Hypothyroidism   ? Kidney tumor 2010  ? RFA  ? Personal history of chemotherapy   ? Personal history of radiation therapy   ? Prediabetes   ? Rheumatoid arthritis(714.0)   ? UTI (lower urinary tract infection)   ? ? ?Objective:  ?Physical Exam: ?Vascular: DP/PT pulses 2/4 bilateral. CFT <3 seconds. Normal hair growth on digits. No edema.  ?Skin. No lacerations or abrasions bilateral feet. Hyperkeratotic tissue noted sub 3rd metatarsal ulcer has healed.  ?Musculoskeletal: MMT 5/5 bilateral lower extremities in DF, PF, Inversion and Eversion. Deceased ROM in DF of ankle joint.  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  ?Neurological: Sensation intact to light touch.  ? ?Assessment:  ? ?1. Pre-ulcerative calluses   ?2. Rheumatoid arthritis of left foot, unspecified whether rheumatoid factor present ([place])   ?3. Plantar flexed metatarsal, unspecified laterality   ? ? ? ? ?Plan:  ?Patient was evaluated and treated and all questions answered. ?Ulcer left plantar foot with limited to breakdown of skin. Healed ?-Debridement of hyperkeratotic tissue.  ?-Dressed with bandaid ?-No abx indicated. .  ?-Discussed glucose control and proper  protein-rich diet.  ?-Discussed if any worsening redness, pain, fever or chills to call or may need to report to the emergency room. Patient expressed understanding.  ?Follow-up with Dr. Elisha Ponder for rfc.  ? ? ?Return if symptoms worsen or fail to improve. ? ? ?Lorenda Peck, DPM  ? ? ?

## 2022-02-06 DIAGNOSIS — M0579 Rheumatoid arthritis with rheumatoid factor of multiple sites without organ or systems involvement: Secondary | ICD-10-CM | POA: Diagnosis not present

## 2022-02-07 DIAGNOSIS — M5451 Vertebrogenic low back pain: Secondary | ICD-10-CM | POA: Diagnosis not present

## 2022-02-12 ENCOUNTER — Ambulatory Visit (INDEPENDENT_AMBULATORY_CARE_PROVIDER_SITE_OTHER): Payer: Medicare Other | Admitting: Podiatry

## 2022-02-12 ENCOUNTER — Encounter: Payer: Self-pay | Admitting: Podiatry

## 2022-02-12 DIAGNOSIS — Z872 Personal history of diseases of the skin and subcutaneous tissue: Secondary | ICD-10-CM

## 2022-02-14 DIAGNOSIS — M5451 Vertebrogenic low back pain: Secondary | ICD-10-CM | POA: Diagnosis not present

## 2022-02-20 NOTE — Progress Notes (Signed)
?  Subjective:  ?Patient ID: [name], female    DOB: 1934-01-25,  MRN: [id number] ? ?[name] presents to clinic today for follow up of ulceration to the {left foot ? ?Patient states she has been wearing her Darco shoe most of the time. She does have some  ? ?New problem(s): None.  ? ?PCP is Jonathon Jordan, MD , and last visit was December 05, 2021. ? ?Allergies  ?Allergen Reactions  ? Dilaudid [Hydromorphone Hcl] Nausea And Vomiting  ? Alendronate Other (See Comments)  ?  Other reaction(s): Severe heartburn ?Other reaction(s): Severe heartburn  ? Alendronate Sodium   ?  Other reaction(s): Severe heartburn  ? Azithromycin   ?  Other reaction(s): made her feel bad ?Other reaction(s): made her feel bad  ? Gabapentin   ?  Other reaction(s): Unknown  ? Hydromorphone   ?  Other reaction(s): severe N V ?Other reaction(s): severe N V  ? Morphine And Related   ? Morphine Sulfate   ?  Other reaction(s): vomitting  ? Paclitaxel   ?  rash ?Other reaction(s): rash  ? Parathyroid Hormone ([place])   ?  Other reaction(s): (radiation therapy for breast cancer)  ? Promethazine   ?  Other reaction(s): hallucinations ?Other reaction(s): hallucinations  ? Promethazine Hcl   ?  Other reaction(s): hallucinations  ? Sulfasalazine   ?  Other reaction(s): Unknown  ? Tamiflu [Oseltamivir Phosphate]   ?  rash  ? Tetanus Immune Globulin   ? Tetanus Toxoid   ?  Other reaction(s): localized reaction  ? Tetanus Toxoid, Adsorbed   ?  Other reaction(s): localized reaction ?Other reaction(s): localized reaction  ? Tetanus Toxoids   ? Tramadol Other (See Comments)  ? Oseltamivir Rash  ?  Other reaction(s): Rash ?Other reaction(s): Rash ?Other reaction(s): Rash  ? ? ?Review of Systems: Negative except as noted in the HPI. ? ?Objective: ?General: Patient is a pleasant 88 y.o. Caucasian female WD, WN in NAD. AAO x 3.  ? ?Neurovascular Examination: ?CFT immediate b/l LE. Palpable DP/PT pulses b/l LE. Digital hair sparse b/l. Skin temperature  gradient WNL b/l. No pain with calf compression b/l. No edema noted b/l. No cyanosis or clubbing noted b/l LE. ? ?Protective sensation intact 5/5 intact bilaterally with 10g monofilament b/l. Vibratory sensation intact b/l. ? ?Dermatological:  ?Pedal integument with normal turgor, texture and tone BLE. No open wounds b/l LE. No interdigital macerations noted b/l LE. Toenails recently debrided. Preulcerative lesion noted submet head 3 left foot. There is visible subdermal hemorrhage, but now decreased compared to previous visits. There is no surrounding erythema, no edema, no drainage, no odor, no fluctuance. Slight tenderness to palpation. Postdebridement, ulcer is completely epithelialized. ? ?Musculoskeletal:  ?Normal muscle strength 5/5 to all lower extremity muscle groups bilaterally. Severe hammertoe deformity noted 2-5 bilaterally.. No pain, crepitus or joint limitation noted with ROM b/l LE.  Patient ambulates independently without assistive aids. ? ?Assessment/Plan: ?1. Healed ulcer of left foot   ?-Patient was evaluated and treated. All patient's and/or POA's questions/concerns answered on today's visit. ?-Ulcer is completely epithelialized. She is to continue her offloaded Darco shoe when at home. She may wear her sneaker with offloaded insole when going out.. ?-Patient/POA to call should there be question/concern in the interim.  ? ?Return in about 3 weeks (around 03/05/2022) for routine foot care. ? ?Marzetta Board, DPM  ?

## 2022-02-21 DIAGNOSIS — M5451 Vertebrogenic low back pain: Secondary | ICD-10-CM | POA: Diagnosis not present

## 2022-02-28 DIAGNOSIS — M5451 Vertebrogenic low back pain: Secondary | ICD-10-CM | POA: Diagnosis not present

## 2022-03-04 ENCOUNTER — Ambulatory Visit (INDEPENDENT_AMBULATORY_CARE_PROVIDER_SITE_OTHER): Payer: Medicare Other | Admitting: Podiatry

## 2022-03-04 ENCOUNTER — Encounter: Payer: Self-pay | Admitting: Podiatry

## 2022-03-04 DIAGNOSIS — L97521 Non-pressure chronic ulcer of other part of left foot limited to breakdown of skin: Secondary | ICD-10-CM

## 2022-03-04 DIAGNOSIS — M069 Rheumatoid arthritis, unspecified: Secondary | ICD-10-CM

## 2022-03-05 ENCOUNTER — Ambulatory Visit: Payer: Medicare Other | Admitting: Podiatry

## 2022-03-07 DIAGNOSIS — M5451 Vertebrogenic low back pain: Secondary | ICD-10-CM | POA: Diagnosis not present

## 2022-03-10 NOTE — Progress Notes (Signed)
Subjective:  Patient ID: [name], female    DOB: 04-May-1934,  MRN: [id number]  [name] presents to clinic today for follow up h/o recurrent ulcer plantar aspect left midfoot  Patient states foot is a little sore today. She denies any redness, drainage or swelling. She has continued to wear her Darco shoe at home. Denies any fever, chills, night sweats, nausea or vomiting.  New problem(s): None.   PCP is Jonathon Jordan, MD , and last visit was December 05, 2021.  Allergies  Allergen Reactions   Dilaudid [Hydromorphone Hcl] Nausea And Vomiting   Alendronate Other (See Comments)    Other reaction(s): Severe heartburn Other reaction(s): Severe heartburn   Alendronate Sodium     Other reaction(s): Severe heartburn   Azithromycin     Other reaction(s): made her feel bad Other reaction(s): made her feel bad   Gabapentin     Other reaction(s): Unknown   Hydromorphone     Other reaction(s): severe N V Other reaction(s): severe N V   Morphine And Related    Morphine Sulfate     Other reaction(s): vomitting   Paclitaxel     rash Other reaction(s): rash   Parathyroid Hormone ([place])     Other reaction(s): (radiation therapy for breast cancer)   Promethazine     Other reaction(s): hallucinations Other reaction(s): hallucinations   Promethazine Hcl     Other reaction(s): hallucinations   Sulfasalazine     Other reaction(s): Unknown   Tamiflu [Oseltamivir Phosphate]     rash   Tetanus Immune Globulin    Tetanus Toxoid     Other reaction(s): localized reaction   Tetanus Toxoid, Adsorbed     Other reaction(s): localized reaction Other reaction(s): localized reaction   Tetanus Toxoids    Tramadol Other (See Comments)   Oseltamivir Rash    Other reaction(s): Rash Other reaction(s): Rash Other reaction(s): Rash    Review of Systems: Negative except as noted in the HPI.  Objective: No changes noted in today's physical examination. General: Patient is a  pleasant 88 y.o. Caucasian female WD, WN in NAD. AAO x 3.   Neurovascular Examination: CFT immediate b/l LE. Palpable DP/PT pulses b/l LE. Digital hair sparse b/l. Skin temperature gradient WNL b/l. No pain with calf compression b/l. No edema noted b/l. No cyanosis or clubbing noted b/l LE.  Protective sensation intact 5/5 intact bilaterally with 10g monofilament b/l. Vibratory sensation intact b/l.  Dermatological:  Pedal integument with normal turgor, texture and tone BLE. No open wounds b/l LE. No interdigital macerations noted b/l LE. Toenails with adequate length and well maintained.  Preulcerative lesion noted submet head 3 left foot 1.0 x 1.0 cm hyperkeratotic roof. There is visible subdermal hemorrhage. Post debridement reveals partial thickness ulcer. There is no surrounding erythema, no edema, no drainage, no odor, no fluctuance. Slight tenderness to palpation. Post-debridement, there is a 0.5 x 0.5 x 0.1 cm partial thickness ulcer with granular base. No erythema, no edema, no drainage, viable wound edges. No evidence of deep space infection.  Musculoskeletal:  Normal muscle strength 5/5 to all lower extremity muscle groups bilaterally. Severe hammertoe deformity noted 2-5 bilaterally.. No pain, crepitus or joint limitation noted with ROM b/l LE.  Patient ambulates independently without assistive aids.  Assessment/Plan: 1. Skin ulcer of left foot, limited to breakdown of skin ([place])   2. Rheumatoid arthritis of left foot, unspecified whether rheumatoid factor present [facility])     Plan: -Patient was evaluated and treated and  all questions answered.  -Patient/POA/Family member educated on diagnosis and treatment plan of routine ulcer debridement/wound care.  -Ulceration debridement achieved utilizing sharp excisional debridement with sterile scalpel blade.. Type/amount of devitalized tissue removed: nonviable hyperkeratosis -Today's ulcer size post-debridement: 0.5 x 0.5 x 0.1  cm. -Ulceration cleansed with wound cleanser. Betadine ointment applied to base of ulceration and secured with light dressing. -Wound responded well to today's debridement. -Patient risk factors affecting healing of ulcer: history of foot/leg ulcer, foot deformity, RA, immunosuppressive medications -Patient to apply Iodosorb Gel once daily. Continue Darco shoe daily.  --Patient/POA to call should there be question/concern in the interim.   Return in about 3 weeks (around 03/25/2022).  Marzetta Board, DPM

## 2022-03-14 DIAGNOSIS — M5451 Vertebrogenic low back pain: Secondary | ICD-10-CM | POA: Diagnosis not present

## 2022-03-20 DIAGNOSIS — L03113 Cellulitis of right upper limb: Secondary | ICD-10-CM | POA: Diagnosis not present

## 2022-03-21 DIAGNOSIS — M5451 Vertebrogenic low back pain: Secondary | ICD-10-CM | POA: Diagnosis not present

## 2022-03-21 NOTE — Patient Outreach (Signed)
[facility]) Care Management  03/21/2022  ALDENE HENDON 05-07-1934 [id number]   Received referral for Care Management from Insurance plan. Assigned patient to Valente David, RN Care Coordinator for follow up  [place] Management Assistant [phone]

## 2022-03-25 ENCOUNTER — Other Ambulatory Visit: Payer: Self-pay | Admitting: *Deleted

## 2022-03-25 NOTE — Patient Outreach (Signed)
[facility]) Care Management  03/25/2022  [name] 28-Apr-1934 [id number]   Referral Date: 6/9 Referral Source: Insurance Referral Reason: Chronic care management Insurance: [employer]   Outreach attempt #1, successful however she state she is very hard of hearing and unable to understand this care manager.  Denies having someone in the home to speak with, state daughter is at a doctor's appointment and her husband is in ALF.    Plan: RN CM will send outreach letter and follow up within the next 3-4 business days in hopes of speaking with daughter.  Valente David, RN, MSN, [place] Manager [phone]

## 2022-03-26 ENCOUNTER — Encounter: Payer: Self-pay | Admitting: Podiatry

## 2022-03-26 ENCOUNTER — Ambulatory Visit (INDEPENDENT_AMBULATORY_CARE_PROVIDER_SITE_OTHER): Payer: Medicare Other | Admitting: Podiatry

## 2022-03-26 DIAGNOSIS — M05771 Rheumatoid arthritis with rheumatoid factor of right ankle and foot without organ or systems involvement: Secondary | ICD-10-CM | POA: Diagnosis not present

## 2022-03-26 DIAGNOSIS — L84 Corns and callosities: Secondary | ICD-10-CM | POA: Diagnosis not present

## 2022-03-26 DIAGNOSIS — M05772 Rheumatoid arthritis with rheumatoid factor of left ankle and foot without organ or systems involvement: Secondary | ICD-10-CM

## 2022-03-28 DIAGNOSIS — M5451 Vertebrogenic low back pain: Secondary | ICD-10-CM | POA: Diagnosis not present

## 2022-03-31 ENCOUNTER — Other Ambulatory Visit: Payer: Self-pay | Admitting: *Deleted

## 2022-03-31 NOTE — Patient Outreach (Signed)
[facility]) Care Management  03/31/2022  [name] 1933-11-26 [id number]   Referral Date: 6/9 Referral Source: Insurance Referral Reason: Chronic care management Insurance: [employer]   Outreach attempt #2, successful to member however unable to complete screening assessment as member has hearing problems.  Immediately state no one is home with her to help with call.  Call placed to son, no answer, HIPAA compliant voice message left.    Plan: RN CM will send outreach letter and follow up with son within the next 5-7 business days.  Valente David, RN, MSN, [place] Manager [phone]

## 2022-03-31 NOTE — Progress Notes (Signed)
Subjective:  Patient ID: [name], female    DOB: 1934/01/10,  MRN: [id number]  [name] presents to clinic today for at risk foot care with h/o RA on immunosuppressive therapy and preulcerative lesion(s) b/l lower extremities. Pain prevent(s) comfortable ambulation. Aggravating factor is weightbearing with and without shoegear.  She states her left foot is sore. She has attempted trimming with her blade, but states only minimal trimming without any trauma to area.  PCP is Jonathon Jordan, MD , and last visit was December 05, 2021.  Allergies  Allergen Reactions   Dilaudid [Hydromorphone Hcl] Nausea And Vomiting   Alendronate Other (See Comments)    Other reaction(s): Severe heartburn Other reaction(s): Severe heartburn   Alendronate Sodium     Other reaction(s): Severe heartburn   Azithromycin     Other reaction(s): made her feel bad Other reaction(s): made her feel bad   Gabapentin     Other reaction(s): Unknown   Hydromorphone     Other reaction(s): severe N V Other reaction(s): severe N V   Morphine And Related    Morphine Sulfate     Other reaction(s): vomitting   Paclitaxel     rash Other reaction(s): rash   Parathyroid Hormone ([place])     Other reaction(s): (radiation therapy for breast cancer)   Promethazine     Other reaction(s): hallucinations Other reaction(s): hallucinations   Promethazine Hcl     Other reaction(s): hallucinations   Sulfasalazine     Other reaction(s): Unknown   Tamiflu [Oseltamivir Phosphate]     rash   Tetanus Immune Globulin    Tetanus Toxoid     Other reaction(s): localized reaction   Tetanus Toxoid, Adsorbed     Other reaction(s): localized reaction Other reaction(s): localized reaction   Tetanus Toxoids    Tramadol Other (See Comments)   Oseltamivir Rash    Other reaction(s): Rash Other reaction(s): Rash Other reaction(s): Rash    Review of Systems: Negative except as noted in the HPI.  Objective:  General:  Patient is a pleasant 88 y.o. Caucasian female WD, WN in NAD. AAO x 3.   Neurovascular Examination: CFT immediate b/l LE. Palpable DP/PT pulses b/l LE. Digital hair sparse b/l. Skin temperature gradient WNL b/l. No pain with calf compression b/l. No edema noted b/l. No cyanosis or clubbing noted b/l LE.  Protective sensation intact 5/5 intact bilaterally with 10g monofilament b/l. Vibratory sensation intact b/l.  Dermatological:  Pedal integument with normal turgor, texture and tone BLE. No open wounds b/l LE. No interdigital macerations noted b/l LE. Toenails with adequate length and well maintained.  Preulcerative lesion noted submet head 3 left foot 1.0 x 1.0 cm hyperkeratotic roof. There is visible subdermal hemorrhage. Post debridement reveals partial thickness ulcer. There is no surrounding erythema, no edema, no drainage, no odor, no fluctuance. Slight tenderness to palpation. No underlying wound noted. Hyperkeratotic lesion submet head 3 right foot with no erythema, no edema, no drainage, no fluctuance.  Musculoskeletal:  Normal muscle strength 5/5 to all lower extremity muscle groups bilaterally. Severe hammertoe deformity noted 2-5 bilaterally. Severely plantarflexed metatarsals submet head 3 bilaterally with tenderness to palpation. Patient ambulates independently without assistive aids.  Assessment/Plan: 1. Pre-ulcerative calluses   2. Callus   3. Rheumatoid arthritis involving both feet with positive rheumatoid factor ([place])     -Patient was evaluated and treated. All patient's and/or POA's questions/concerns answered on today's visit. -Continue offloaded Darco shoe prn when at home. -Medicare ABN signed. Patient consents  for services of paring of corn(s)/callus(es)/porokeratos(es) today. Copy in patient chart. -Patient to continue soft, supportive shoe gear daily. -Callus(es) submet head 3 right foot pared utilizing sterile scalpel blade without complication or incident. Total  number debrided =1. -Preulcerative lesion pared submet head 3 left foot. Total number pared=1. -Patient/POA to call should there be question/concern in the interim.   Return in about 4 weeks (around 04/23/2022).  Marzetta Board, DPM

## 2022-04-04 DIAGNOSIS — M5451 Vertebrogenic low back pain: Secondary | ICD-10-CM | POA: Diagnosis not present

## 2022-04-07 ENCOUNTER — Other Ambulatory Visit: Payer: Self-pay | Admitting: *Deleted

## 2022-04-11 DIAGNOSIS — M5451 Vertebrogenic low back pain: Secondary | ICD-10-CM | POA: Diagnosis not present

## 2022-04-16 DIAGNOSIS — M48061 Spinal stenosis, lumbar region without neurogenic claudication: Secondary | ICD-10-CM | POA: Diagnosis not present

## 2022-04-16 DIAGNOSIS — F330 Major depressive disorder, recurrent, mild: Secondary | ICD-10-CM | POA: Diagnosis not present

## 2022-04-23 ENCOUNTER — Encounter: Payer: Self-pay | Admitting: Podiatry

## 2022-04-23 ENCOUNTER — Ambulatory Visit (INDEPENDENT_AMBULATORY_CARE_PROVIDER_SITE_OTHER): Payer: Medicare Other | Admitting: Podiatry

## 2022-04-23 DIAGNOSIS — M05771 Rheumatoid arthritis with rheumatoid factor of right ankle and foot without organ or systems involvement: Secondary | ICD-10-CM

## 2022-04-23 DIAGNOSIS — L84 Corns and callosities: Secondary | ICD-10-CM

## 2022-04-23 DIAGNOSIS — M79674 Pain in right toe(s): Secondary | ICD-10-CM | POA: Diagnosis not present

## 2022-04-23 DIAGNOSIS — M79675 Pain in left toe(s): Secondary | ICD-10-CM

## 2022-04-23 DIAGNOSIS — B351 Tinea unguium: Secondary | ICD-10-CM | POA: Diagnosis not present

## 2022-04-23 DIAGNOSIS — M05772 Rheumatoid arthritis with rheumatoid factor of left ankle and foot without organ or systems involvement: Secondary | ICD-10-CM

## 2022-04-27 NOTE — Progress Notes (Signed)
Subjective:  Patient ID: [name], female    DOB: July 07, 1934,  MRN: [id number]  [name] presents to clinic today for at risk foot care with h/o RA on immunosuppressive therapy and preulcerative lesion(s) b/l lower extremities and painful mycotic toenails that limit ambulation. Painful toenails interfere with ambulation. Aggravating factors include wearing enclosed shoe gear. Pain is relieved with periodic professional debridement. Painful porokeratotic lesions are aggravated when weightbearing with and without shoegear. Pain is relieved with periodic professional debridement.  Patient states her left foot was painful after last visit. She has purchased a new pair of Rolena Infante sneakers and states her feet feel better.  New problem(s): None.   PCP is Jonathon Jordan, MD , and last visit was  April 16, 2022  Allergies  Allergen Reactions   Dilaudid [Hydromorphone Hcl] Nausea And Vomiting   Alendronate Other (See Comments)    Other reaction(s): Severe heartburn Other reaction(s): Severe heartburn   Alendronate Sodium     Other reaction(s): Severe heartburn   Azithromycin     Other reaction(s): made her feel bad Other reaction(s): made her feel bad   Gabapentin     Other reaction(s): Unknown   Hydromorphone     Other reaction(s): severe N V Other reaction(s): severe N V   Morphine And Related    Morphine Sulfate     Other reaction(s): vomitting   Paclitaxel     rash Other reaction(s): rash   Parathyroid Hormone ([place]comb)     Other reaction(s): (radiation therapy for breast cancer)   Promethazine     Other reaction(s): hallucinations Other reaction(s): hallucinations   Promethazine Hcl     Other reaction(s): hallucinations   Sulfasalazine     Other reaction(s): Unknown   Tamiflu [Oseltamivir Phosphate]     rash   Tetanus Immune Globulin    Tetanus Toxoid     Other reaction(s): localized reaction   Tetanus Toxoid, Adsorbed     Other reaction(s): localized  reaction Other reaction(s): localized reaction   Tetanus Toxoids    Tramadol Other (See Comments)   Oseltamivir Rash    Other reaction(s): Rash Other reaction(s): Rash Other reaction(s): Rash    Review of Systems: Negative except as noted in the HPI.  Objective:   General: Patient is a pleasant 88 y.o. Caucasian female WD, WN in NAD. AAO x 3.   Neurovascular Examination: CFT immediate b/l LE. Palpable DP/PT pulses b/l LE. Digital hair sparse b/l. Skin temperature gradient WNL b/l. No pain with calf compression b/l. No edema noted b/l. No cyanosis or clubbing noted b/l LE.  Protective sensation intact 5/5 intact bilaterally with 10g monofilament b/l. Vibratory sensation intact b/l.  Dermatological:  Pedal integument with normal turgor, texture and tone BLE. No open wounds b/l LE. No interdigital macerations noted b/l LE. Toenails 1-5 b/l elongated, discolored, dystrophic, thickened, crumbly with subungual debris and tenderness to dorsal palpation.   Preulcerative lesion noted submet head 3 left foot 1.0 x 1.0 cm hyperkeratotic roof. There is visible subdermal hemorrhage. Hyperkeratotic lesion submet head 3 right foot with no erythema, no edema, no drainage, no fluctuance.  Musculoskeletal:  Normal muscle strength 5/5 to all lower extremity muscle groups bilaterally. Severe hammertoe deformity noted 2-5 bilaterally. Severely plantarflexed metatarsals submet head 3 bilaterally with tenderness to palpation. Patient ambulates independently without assistive aids.  Assessment/Plan: 1. Pain due to onychomycosis of toenails of both feet   2. Pre-ulcerative calluses   3. Rheumatoid arthritis involving both feet with positive rheumatoid factor ([facility])      -  Patient was evaluated and treated. All patient's and/or POA's questions/concerns answered on today's visit. -Medicare ABN signed for services of paring of corn(s)/callus(es)/porokeratos(es). Copy in patient chart. -Patient to continue  soft, supportive shoe gear daily. -Toenails 1-5 b/l were debrided in length and girth with sterile nail nippers and dremel without iatrogenic bleeding.  -Callus(es) submet head 3 right foot pared utilizing sterile scalpel blade without complication or incident. Total number debrided =1. -Preulcerative lesion pared submet head 3 left foot. Total number pared=1. -Patient/POA to call should there be question/concern in the interim.   Return in about 3 weeks (around 05/14/2022).  Marzetta Board, DPM

## 2022-05-01 DIAGNOSIS — Z961 Presence of intraocular lens: Secondary | ICD-10-CM | POA: Diagnosis not present

## 2022-05-01 DIAGNOSIS — H52203 Unspecified astigmatism, bilateral: Secondary | ICD-10-CM | POA: Diagnosis not present

## 2022-05-12 DIAGNOSIS — I8391 Asymptomatic varicose veins of right lower extremity: Secondary | ICD-10-CM | POA: Diagnosis not present

## 2022-05-12 DIAGNOSIS — L570 Actinic keratosis: Secondary | ICD-10-CM | POA: Diagnosis not present

## 2022-05-12 DIAGNOSIS — Z85828 Personal history of other malignant neoplasm of skin: Secondary | ICD-10-CM | POA: Diagnosis not present

## 2022-05-14 ENCOUNTER — Encounter: Payer: Self-pay | Admitting: Podiatry

## 2022-05-14 ENCOUNTER — Ambulatory Visit (INDEPENDENT_AMBULATORY_CARE_PROVIDER_SITE_OTHER): Payer: Medicare Other | Admitting: Podiatry

## 2022-05-14 DIAGNOSIS — M05772 Rheumatoid arthritis with rheumatoid factor of left ankle and foot without organ or systems involvement: Secondary | ICD-10-CM | POA: Diagnosis not present

## 2022-05-14 DIAGNOSIS — M05771 Rheumatoid arthritis with rheumatoid factor of right ankle and foot without organ or systems involvement: Secondary | ICD-10-CM

## 2022-05-14 DIAGNOSIS — L97522 Non-pressure chronic ulcer of other part of left foot with fat layer exposed: Secondary | ICD-10-CM

## 2022-05-15 DIAGNOSIS — M5459 Other low back pain: Secondary | ICD-10-CM | POA: Diagnosis not present

## 2022-05-15 DIAGNOSIS — M545 Low back pain, unspecified: Secondary | ICD-10-CM | POA: Diagnosis not present

## 2022-05-15 NOTE — Progress Notes (Signed)
Subjective:   [name] presents today with cc of painful left foot. This has been a chronic issue for her waxing and waning due to her rheumatoid arthritis with plantar fat pad atrophy. She has seen several physicians in our office for this. Pan met head resection was presented to her as an option and she is hesitant due to being primary caregiver of her husband who resides at [facility]. Her son lives in [place] and flies in to assist them. Daughter lives out of the country and flies in on occasion to assist them as well. Patient states foot has been bothering her for a few days. She states it is sore with weightbearing.  Patient denies fever, chills, nightsweats, nausea or vomiting.  Patient also states she is having pain when wearing her Darco shoe.  Current Outpatient Medications on File Prior to Visit  Medication Sig Dispense Refill   acetaminophen-codeine (TYLENOL #3) 300-30 MG tablet 1 tablet as needed     ALPRAZolam (XANAX) 0.25 MG tablet Take 0.125 mg by mouth daily as needed for anxiety.      amLODipine (NORVASC) 2.5 MG tablet      amLODipine (NORVASC) 2.5 MG tablet Take 1 tablet by mouth daily.     amoxicillin-clavulanate (AUGMENTIN) 875-125 MG tablet Take 1 tablet by mouth 2 (two) times daily.     B-D TB SYRINGE 1CC/27GX1/2" 27G X 1/2" 1 ML MISC Inject into the skin once a week.     BD DISP NEEDLES 27G X 1/2" MISC Inject into the skin.     betamethasone dipropionate (DIPROLENE) 0.05 % cream      Calcium Citrate-Vitamin D (CITRACAL + D PO) Take 1 tablet by mouth daily.     cholecalciferol (VITAMIN D) 1000 UNITS tablet Take 1,000 Units by mouth daily.     Cholecalciferol (VITAMIN D3) 25 MCG (1000 UT) CAPS 1 capsule     clobetasol cream (TEMOVATE) 0.05 %      cyclobenzaprine (FLEXERIL) 10 MG tablet TK 1/2 TO 1 T PO Q 8 H PRF MUSCLE SPASM     denosumab (PROLIA) 60 MG/ML SOSY injection 60 mg     diclofenac Sodium (VOLTAREN) 1 % GEL      diclofenac Sodium  (VOLTAREN) 1 % GEL See admin instructions.     dicyclomine (BENTYL) 10 MG capsule Take 10 mg by mouth daily as needed for spasms.      Dicyclomine HCl (BENTYL IM)      fexofenadine (ALLEGRA ALLERGY) 180 MG tablet 1 tablet as needed     fish oil-omega-3 fatty acids 1000 MG capsule Take 1,200 mg by mouth daily.      fluorouracil (EFUDEX) 5 % cream Apply topically.     folic acid (FOLVITE) 1 MG tablet Take 2 mg by mouth daily.      hydrocortisone 2.5 % cream hydrocortisone 2.5 % topical cream     hydrocortisone cream 0.5 % 1 application     IBUPROFEN PO ibuprofen     levothyroxine (SYNTHROID, LEVOTHROID) 150 MCG tablet Take 150 mcg by mouth daily before breakfast.     loratadine (CLARITIN) 10 MG tablet Take by mouth.     Melatonin 2.5 MG CAPS Take 2.5 mg by mouth at bedtime as needed.      melatonin 3 MG TABS tablet 2.25mg **1 tablet     Methotrexate Sodium (METHOTREXATE, PF,) 50 MG/2ML injection      Multiple Vitamin (MULTIVITAMIN) capsule Take 1 capsule by mouth daily.  Multiple Vitamins-Minerals (MULTI FOR HER) TABS      naproxen (NAPRELAN) 500 MG 24 hr tablet Take 500 mg by mouth daily with breakfast.      naproxen (NAPROSYN) 500 MG tablet      Polyethylene Glycol 3350 (MIRALAX PO) Take by mouth See admin instructions. 3/4 a capful once daily     potassium chloride (K-DUR) 10 MEQ tablet Take by mouth.     potassium chloride SA (K-DUR,KLOR-CON) 20 MEQ tablet Take 20 mEq by mouth daily.     predniSONE (STERAPRED UNI-PAK 21 TAB) 5 MG (21) TBPK tablet Take by mouth.     ranitidine (ZANTAC) 75 MG tablet Take 75 mg by mouth as needed for heartburn.     simvastatin (ZOCOR) 20 MG tablet 1 tablet in the evening     simvastatin (ZOCOR) 40 MG tablet 1/2 tablet     sodium chloride (OCEAN) 0.65 % nasal spray      triamcinolone cream (KENALOG) 0.5 % Apply topically.     triamterene-hydrochlorothiazide (MAXZIDE-25) 37.5-25 MG per tablet Take 1 tablet by mouth daily.     vitamin B-12  (CYANOCOBALAMIN) 500 MCG tablet 1 tablet     No current facility-administered medications on file prior to visit.     Allergies  Allergen Reactions   Dilaudid [Hydromorphone Hcl] Nausea And Vomiting   Alendronate Other (See Comments)    Other reaction(s): Severe heartburn Other reaction(s): Severe heartburn   Alendronate Sodium     Other reaction(s): Severe heartburn   Azithromycin     Other reaction(s): made her feel bad Other reaction(s): made her feel bad   Gabapentin     Other reaction(s): Unknown   Hydromorphone     Other reaction(s): severe N V Other reaction(s): severe N V   Morphine And Related    Morphine Sulfate     Other reaction(s): vomitting   Paclitaxel     rash Other reaction(s): rash   Parathyroid Hormone (Recomb)     Other reaction(s): (radiation therapy for breast cancer)   Promethazine     Other reaction(s): hallucinations Other reaction(s): hallucinations   Promethazine Hcl     Other reaction(s): hallucinations   Sulfasalazine     Other reaction(s): Unknown   Tamiflu [Oseltamivir Phosphate]     rash   Tetanus Immune Globulin    Tetanus Toxoid     Other reaction(s): localized reaction   Tetanus Toxoid, Adsorbed     Other reaction(s): localized reaction Other reaction(s): localized reaction   Tetanus Toxoids    Tramadol Other (See Comments)   Oseltamivir Rash    Other reaction(s): Rash Other reaction(s): Rash Other reaction(s): Rash     Objective:   There were no vitals filed for this visit.   Vascular Examination: CFT immediate b/l LE. Palpable DP/PT pulses b/l LE. Digital hair sparse b/l. Skin temperature gradient WNL b/l. No pain with calf compression b/l. No edema noted b/l. No cyanosis or clubbing noted b/l LE.  Dermatological Examination:  Pedal skin is warm and supple b/l LE. No interdigital macerations noted b/l LE. Toenails recently debrided. Wound Location: submet head 3 left foot There is a minimal amount of devitalized tissue  present in the wound. Predebridement Wound Measurement:  1.0  x 1.0 cm with hyperkeratotic roof and visible subcutaneous hemorrhage. Postdebridement Wound Measurement: 1.5 x 1.0 x 0.2 cm to level of subcutaneous tissue. Wound Base: Granular/Healthy Peri-wound: Normal Exudate: Scant/small amount Serous exudate Blood Loss during debridement: 0 cc('s). Material in wound which inhibits healing/promotes  adjacent tissue breakdown:  nonviable hyperkeratosis and necrotic tissue. Description of tissue removed from ulceration today:  nonviable hyperkeratosis and necrotic tissue. Sign(s) of clinical bacterial infection: no clinical signs of infection noted on examination today.   Musculoskeletal Examination: Muscle strength 5/5 to all lower extremity muscle groups bilaterally. Severely plantarflexed metatarsals 1-5 b/l. Severe hammertoe deformity noted 1-5 bilaterally. Patient ambulates independent of any assistive aids. Wearing appropriate fitting shoe gear.  Neurological Examination: Protective sensation intact 5/5 intact bilaterally with 10g monofilament b/l. Vibratory sensation intact b/l. Proprioception intact bilaterally.  Assessment:   1. Ulcer of left foot with fat layer exposed ([place])  2. Rheumatoid arthritis involving both feet with positive rheumatoid factor ([place])   Plan: -Patient was evaluated and treated and all questions answered.  -No xray taken today. No clinical signs/symptoms or suspicion of infection on today. -We did discuss the waxing and waning of her ulceration of the left foot. We revisited the pan met head resection as a definitive treatment for her chronic ulceration. She voiced she is not ready to proceed with this option. I have asked her to bring in her Darco shoe when she comes to see Dr. Blenda Mounts to see if it needs modifying. She related understanding. -Patient/POA/Family member educated on diagnosis and treatment plan of routine ulcer debridement/wound care.  -Ulceration  debridement achieved utilizing sharp excisional debridement with sterile scalpel blade and sterile currette.. Type/amount of devitalized tissue removed: nonviable hyperkeratosis and necrotic tissue -Today's ulcer size post-debridement: 1.5 x 1.0 x 0.2 cm. -Ulceration cleansed with wound cleanser. Iodosorb Gel applied to base of ulceration and secured with light dressing. -Wound responded well to today's debridement. -Patient risk factors affecting healing of ulcer: history of foot/leg ulcer, foot deformity, RA, immunosuppressive medications -[name] is aware of  instructions on daily wound care for submet head 3 left foot ulceration. -Frequency of debridements needed to achieve healing: weekly to biweekly -Wound culture and sensitivity ordered today. --Patient scheduled to see Dr. Lorenda Peck per patient's request in one to two weeks for follow up of ulceration submet head 3 left foot. -Patient/POA to call should there be question/concern in the interim.  Return in about 4 weeks (around 06/11/2022).  Marzetta Board, DPM

## 2022-05-21 ENCOUNTER — Encounter: Payer: Self-pay | Admitting: Podiatry

## 2022-05-21 ENCOUNTER — Ambulatory Visit (INDEPENDENT_AMBULATORY_CARE_PROVIDER_SITE_OTHER): Payer: Medicare Other | Admitting: Podiatry

## 2022-05-21 DIAGNOSIS — L97522 Non-pressure chronic ulcer of other part of left foot with fat layer exposed: Secondary | ICD-10-CM

## 2022-05-21 DIAGNOSIS — M05771 Rheumatoid arthritis with rheumatoid factor of right ankle and foot without organ or systems involvement: Secondary | ICD-10-CM

## 2022-05-21 DIAGNOSIS — M05772 Rheumatoid arthritis with rheumatoid factor of left ankle and foot without organ or systems involvement: Secondary | ICD-10-CM

## 2022-05-21 NOTE — Progress Notes (Signed)
  Subjective:  Patient ID: [name], female    DOB: Nov 26, 1933,   MRN: [id number]  Chief Complaint  Patient presents with   Foot Pain    Left foot is better since the last visit. Some pain not much , callus seems to be covering ulcer.    88 y.o. female presents for follow-up of wound that has reopened on her left foot. She has been following with Dr. Elisha Ponder and has been wearing Darco shoe. Keeping the area dressed as instructed. . . Denies any other pedal complaints. Denies n/v/f/c.   Past Medical History:  Diagnosis Date   Abnormal cardiovascular stress test 12/2013   s/p cath with mild nonobstructive CAD normal EF - managed medically   Breast cancer ([place]) 2008   RT, chemo, lumpectomy   Diverticulitis    DVT (deep vein thrombosis) in pregnancy    site of PICC catheter   HTN (hypertension)    Hyperlipidemia    Hypothyroidism    Kidney tumor 2010   RFA   Personal history of chemotherapy    Personal history of radiation therapy    Prediabetes    Rheumatoid arthritis(714.0)    UTI (lower urinary tract infection)     Objective:  Physical Exam: Vascular: DP/PT pulses 2/4 bilateral. CFT <3 seconds. Normal hair growth on digits. No edema.  Skin. No lacerations or abrasions bilateral feet. Hyperkeratotic tissue noted sub 3rd metatarsal ulcer wound measuring 0.1 cm x 0.1 cmx 0.1cm with granular base. No erythema edema or purulence noted.  Musculoskeletal: MMT 5/5 bilateral lower extremities in DF, PF, Inversion and Eversion. Deceased ROM in DF of ankle joint.  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  Neurological: Sensation intact to light touch.   Assessment:   1. Ulcer of left foot with fat layer exposed ([place])   2. Rheumatoid arthritis involving both feet with positive rheumatoid factor ([place])        Plan:  Patient was evaluated and treated and all questions answered. Ulcer left plantar foot with limited to breakdown of skin.   -Debridement as below. -Dressed with iodosorb, DSD. -Off-loading with surgical shoe. Adjusted inserts on tennis shoes as well.  -No abx indicated.  -Discussed glucose control and proper protein-rich diet.  -Discussed if any worsening redness, pain, fever or chills to call or may need to report to the emergency room. Patient expressed understanding.   Procedure: Excisional Debridement of Wound Rationale: Removal of non-viable soft tissue from the wound to promote healing.  Anesthesia: none Pre-Debridement Wound Measurements: Overlying callus  Post-Debridement Wound Measurements: 0.1 cm x 0.1 cm x 0.1 cm  Type of Debridement: Sharp Excisional Tissue Removed: Non-viable soft tissue Depth of Debridement: subcutaneous tissue. Technique: Sharp excisional debridement to bleeding, viable wound base.  Dressing: Dry, sterile, compression dressing. Disposition: Patient tolerated procedure well. Patient to return in 2 week for follow-up.  No follow-ups on file.    No follow-ups on file.   Lorenda Peck, DPM

## 2022-05-24 DIAGNOSIS — M545 Low back pain, unspecified: Secondary | ICD-10-CM | POA: Diagnosis not present

## 2022-05-24 DIAGNOSIS — M5459 Other low back pain: Secondary | ICD-10-CM | POA: Diagnosis not present

## 2022-05-26 DIAGNOSIS — M1991 Primary osteoarthritis, unspecified site: Secondary | ICD-10-CM | POA: Diagnosis not present

## 2022-05-26 DIAGNOSIS — Z79899 Other long term (current) drug therapy: Secondary | ICD-10-CM | POA: Diagnosis not present

## 2022-05-26 DIAGNOSIS — M0579 Rheumatoid arthritis with rheumatoid factor of multiple sites without organ or systems involvement: Secondary | ICD-10-CM | POA: Diagnosis not present

## 2022-05-26 DIAGNOSIS — M2550 Pain in unspecified joint: Secondary | ICD-10-CM | POA: Diagnosis not present

## 2022-05-26 DIAGNOSIS — Z6823 Body mass index (BMI) 23.0-23.9, adult: Secondary | ICD-10-CM | POA: Diagnosis not present

## 2022-05-30 DIAGNOSIS — J029 Acute pharyngitis, unspecified: Secondary | ICD-10-CM | POA: Diagnosis not present

## 2022-06-04 DIAGNOSIS — M5451 Vertebrogenic low back pain: Secondary | ICD-10-CM | POA: Diagnosis not present

## 2022-06-10 ENCOUNTER — Ambulatory Visit: Payer: Medicare Other | Admitting: Podiatry

## 2022-06-10 ENCOUNTER — Ambulatory Visit (INDEPENDENT_AMBULATORY_CARE_PROVIDER_SITE_OTHER): Payer: Medicare Other | Admitting: Podiatry

## 2022-06-10 ENCOUNTER — Encounter: Payer: Self-pay | Admitting: Podiatry

## 2022-06-10 ENCOUNTER — Telehealth: Payer: Self-pay | Admitting: Podiatry

## 2022-06-10 DIAGNOSIS — M05771 Rheumatoid arthritis with rheumatoid factor of right ankle and foot without organ or systems involvement: Secondary | ICD-10-CM

## 2022-06-10 DIAGNOSIS — M05772 Rheumatoid arthritis with rheumatoid factor of left ankle and foot without organ or systems involvement: Secondary | ICD-10-CM

## 2022-06-10 DIAGNOSIS — L84 Corns and callosities: Secondary | ICD-10-CM

## 2022-06-10 NOTE — Telephone Encounter (Signed)
Thank you :)

## 2022-06-10 NOTE — Progress Notes (Signed)
  Subjective:  Patient ID: [name], female    DOB: Dec 21, 1933,   MRN: [id number]  Chief Complaint  Patient presents with   Wound Check    2 week follow wound check    88 y.o. female presents for follow-up of wound that  had reopened.  She has been following with Dr. Elisha Ponder and has been wearing Darco shoe. Keeping the area dressed as instructed. . . Denies any other pedal complaints. Denies n/v/f/c.   Past Medical History:  Diagnosis Date   Abnormal cardiovascular stress test 12/2013   s/p cath with mild nonobstructive CAD normal EF - managed medically   Breast cancer ([place]) 2008   RT, chemo, lumpectomy   Diverticulitis    DVT (deep vein thrombosis) in pregnancy    site of PICC catheter   HTN (hypertension)    Hyperlipidemia    Hypothyroidism    Kidney tumor 2010   RFA   Personal history of chemotherapy    Personal history of radiation therapy    Prediabetes    Rheumatoid arthritis(714.0)    UTI (lower urinary tract infection)     Objective:  Physical Exam: Vascular: DP/PT pulses 2/4 bilateral. CFT <3 seconds. Normal hair growth on digits. No edema.  Skin. No lacerations or abrasions bilateral feet. Hyperkeratotic tissue noted sub 3rd metatarsal with underlying wound healed. . No erythema edema or purulence noted.  Musculoskeletal: MMT 5/5 bilateral lower extremities in DF, PF, Inversion and Eversion. Deceased ROM in DF of ankle joint.  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  Neurological: Sensation intact to light touch.   Assessment:   1. Pre-ulcerative calluses   2. Rheumatoid arthritis involving both feet with positive rheumatoid factor ([place])         Plan:  Patient was evaluated and treated and all questions answered. Ulcer left plantar foot -healed -Debridement of hyperkeratotic tissue without incident.  -Dressed with iodosorb, DSD. -Off-loading with adjusted padding in shoe Adjusted inserts on tennis shoes as  well.  -No abx indicated.  -Discussed glucose control and proper protein-rich diet.  -Discussed if any worsening redness, pain, fever or chills to call or may need to report to the emergency room. Patient expressed understanding.    Return in about 4 weeks (around 07/08/2022) for rfc Dr Elisha Ponder .    Return in about 4 weeks (around 07/08/2022) for rfc Dr Elisha Ponder .   Lorenda Peck, DPM

## 2022-06-10 NOTE — Telephone Encounter (Signed)
Pt called as she was just seen today and thought Dr Blenda Mounts told her to follow up in 2 wks but the after visit summary stated 4 weeks. I did explain that insurance does not cover routine foot care but every 61 days and her appts are only 4 weeks apart and she will be responsible for the bill if she comes in before the 61 day mark. She chose to keep the appts and is aware she may receive a bill for the appts.

## 2022-06-10 NOTE — Progress Notes (Signed)
W

## 2022-07-03 ENCOUNTER — Inpatient Hospital Stay: Admission: RE | Admit: 2022-07-03 | Payer: Medicare Other | Source: Ambulatory Visit

## 2022-07-07 ENCOUNTER — Other Ambulatory Visit: Payer: Self-pay | Admitting: Family Medicine

## 2022-07-07 DIAGNOSIS — E2839 Other primary ovarian failure: Secondary | ICD-10-CM

## 2022-07-09 DIAGNOSIS — M810 Age-related osteoporosis without current pathological fracture: Secondary | ICD-10-CM | POA: Diagnosis not present

## 2022-07-15 ENCOUNTER — Ambulatory Visit (INDEPENDENT_AMBULATORY_CARE_PROVIDER_SITE_OTHER): Payer: Medicare Other | Admitting: Podiatry

## 2022-07-15 ENCOUNTER — Encounter: Payer: Self-pay | Admitting: Podiatry

## 2022-07-15 DIAGNOSIS — M05772 Rheumatoid arthritis with rheumatoid factor of left ankle and foot without organ or systems involvement: Secondary | ICD-10-CM | POA: Diagnosis not present

## 2022-07-15 DIAGNOSIS — L84 Corns and callosities: Secondary | ICD-10-CM | POA: Diagnosis not present

## 2022-07-15 DIAGNOSIS — M5416 Radiculopathy, lumbar region: Secondary | ICD-10-CM | POA: Diagnosis not present

## 2022-07-15 DIAGNOSIS — M05771 Rheumatoid arthritis with rheumatoid factor of right ankle and foot without organ or systems involvement: Secondary | ICD-10-CM | POA: Diagnosis not present

## 2022-07-15 DIAGNOSIS — M216X9 Other acquired deformities of unspecified foot: Secondary | ICD-10-CM

## 2022-07-15 NOTE — Progress Notes (Signed)
This patient presents to the office with painful callus both feet.  She says there is pain in her feet walking and wearing her shoes.  She says she is wearing a pad to help reduce pain left forefoot.  She presents to the office for evaluation and treatment.  Patient has been diagnosed with RA.    Vascular  Dorsalis pedis and posterior tibial pulses are palpable  B/L.  Capillary return  WNL.  Temperature gradient is  WNL.  Skin turgor  WNL  Sensorium  Senn Weinstein monofilament wire  WNL. Normal tactile sensation.  Nail Exam  Patient has normal nails with no evidence of bacterial or fungal infection.  Orthopedic  Exam  Muscle tone and muscle strength  WNL.  No limitations of motion feet  B/L.  No crepitus or joint effusion noted.  Foot type is unremarkable and digits show no abnormalities. Prominent metatarsals noted left greater than right forefoot.  Plantar flexed fourth metatarsal right foot.  HAV  B/L with hammer toes  B/L.  Skin  No open lesions.  Normal skin texture and turgor.   Callus secondary plantar flexed metatarsals  B/L.  ROV.  Padding added to her left shoe.  RTC 10 weeks.   Gardiner Barefoot DPM

## 2022-08-05 DIAGNOSIS — Z23 Encounter for immunization: Secondary | ICD-10-CM | POA: Diagnosis not present

## 2022-08-19 ENCOUNTER — Ambulatory Visit (INDEPENDENT_AMBULATORY_CARE_PROVIDER_SITE_OTHER): Payer: Medicare Other | Admitting: Podiatry

## 2022-08-19 ENCOUNTER — Encounter: Payer: Self-pay | Admitting: Podiatry

## 2022-08-19 DIAGNOSIS — M79672 Pain in left foot: Secondary | ICD-10-CM | POA: Diagnosis not present

## 2022-08-19 DIAGNOSIS — M069 Rheumatoid arthritis, unspecified: Secondary | ICD-10-CM | POA: Diagnosis not present

## 2022-08-19 DIAGNOSIS — M79671 Pain in right foot: Secondary | ICD-10-CM

## 2022-08-19 DIAGNOSIS — L84 Corns and callosities: Secondary | ICD-10-CM

## 2022-08-19 NOTE — Progress Notes (Signed)
Subjective:  Patient ID: [name], female    DOB: July 08, 1934,  MRN: [id number]  [name] presents to clinic today for preulcerative lesion(s) right lower extremity. Pain prevent(s) comfortable ambulation. Aggravating factor is weightbearing with and without shoegear.  Chief Complaint  Patient presents with   Callouses    Calluses ONLY. Bilateral ball of foot.       PCP is Jonathon Jordan, MD , and last visit was December 05, 2021.  Allergies  Allergen Reactions   Dilaudid [Hydromorphone Hcl] Nausea And Vomiting   Alendronate Other (See Comments)    Other reaction(s): Severe heartburn Other reaction(s): Severe heartburn   Alendronate Sodium     Other reaction(s): Severe heartburn   Azithromycin     Other reaction(s): made her feel bad Other reaction(s): made her feel bad   Gabapentin     Other reaction(s): Unknown   Hydromorphone     Other reaction(s): severe N V Other reaction(s): severe N V   Morphine And Related    Morphine Sulfate     Other reaction(s): vomitting   Paclitaxel     rash Other reaction(s): rash   Parathyroid Hormone (Recomb)     Other reaction(s): (radiation therapy for breast cancer)   Promethazine     Other reaction(s): hallucinations Other reaction(s): hallucinations   Promethazine Hcl     Other reaction(s): hallucinations   Sulfasalazine     Other reaction(s): Unknown   Tamiflu [Oseltamivir Phosphate]     rash   Tetanus Immune Globulin    Tetanus Toxoid     Other reaction(s): localized reaction   Tetanus Toxoid, Adsorbed     Other reaction(s): localized reaction Other reaction(s): localized reaction   Tetanus Toxoids    Tramadol Other (See Comments)   Oseltamivir Rash    Other reaction(s): Rash Other reaction(s): Rash Other reaction(s): Rash    Review of Systems: Negative except as noted in the HPI.  Objective: No changes noted in today's physical examination.  [name] is a pleasant 88 y.o. female WD, WN in NAD.  AAO x 3.  Vascular Examination: CFT immediate b/l LE. Palpable DP/PT pulses b/l LE. Digital hair sparse b/l. Skin temperature gradient WNL b/l. No pain with calf compression b/l. No edema noted b/l. No cyanosis or clubbing noted b/l LE.  Dermatological Examination: Pedal skin is warm and supple b/l LE. No interdigital macerations noted b/l LE. Toenails recently debrided.  Preulcerative lesion noted submet head 3 b/l. There is visible subdermal hemorrhage. There is no surrounding erythema, no edema, no drainage, no odor, no fluctuance.       Musculoskeletal Examination: Muscle strength 5/5 to all lower extremity muscle groups bilaterally. Severely plantarflexed metatarsals 1-5 b/l. Severe hammertoe deformity noted 1-5 bilaterally. Patient ambulates independent of any assistive aids. Wearing appropriate fitting shoe gear.  Neurological Examination: Protective sensation intact 5/5 intact bilaterally with 10g monofilament b/l. Vibratory sensation intact b/l. Proprioception intact bilaterally.  Assessment/Plan: 1. Pre-ulcerative calluses   2. Rheumatoid arthritis of left foot, unspecified whether rheumatoid factor present ([facility])   3. Pain in both feet     No orders of the defined types were placed in this encounter.   -Examined patient. -Medicare ABN on file for paring of corn(s)/callus(es)/porokeratos(es). Copy in patient chart. -Preulcerative lesion pared submet head 3 b/l utilizing sterile scalpel blade. Total number pared=2. -Continue offloading pads to both lesions daily. -Patient/POA to call should there be question/concern in the interim.   Return in about 4 weeks (around 09/16/2022).  Marzetta Board, DPM

## 2022-08-28 DIAGNOSIS — M0579 Rheumatoid arthritis with rheumatoid factor of multiple sites without organ or systems involvement: Secondary | ICD-10-CM | POA: Diagnosis not present

## 2022-09-16 ENCOUNTER — Ambulatory Visit (INDEPENDENT_AMBULATORY_CARE_PROVIDER_SITE_OTHER): Payer: Medicare Other | Admitting: Podiatry

## 2022-09-16 VITALS — BP 160/82

## 2022-09-16 DIAGNOSIS — M79671 Pain in right foot: Secondary | ICD-10-CM | POA: Diagnosis not present

## 2022-09-16 DIAGNOSIS — B351 Tinea unguium: Secondary | ICD-10-CM

## 2022-09-16 DIAGNOSIS — L84 Corns and callosities: Secondary | ICD-10-CM | POA: Diagnosis not present

## 2022-09-16 DIAGNOSIS — M79672 Pain in left foot: Secondary | ICD-10-CM

## 2022-09-16 NOTE — Progress Notes (Addendum)
Subjective:  Patient ID: [name], female    DOB: 1933/11/30,  MRN: [id number]  [name] presents to clinic today for preulcerative lesion(s) b/l lower extremities. Pain prevent(s) comfortable ambulation. Aggravating factor is weightbearing with and without shoegear.  Chief Complaint  Patient presents with   Callouses    [place] PCP-[place] PCP [facility]-Last year   Patient states her husband has been moved to new facility and is now in skilled nursing. Her daughter and son have been assisting her with care of her husband.  New problem(s): None.   PCP is Jonathon Jordan, MD.  Allergies  Allergen Reactions   Dilaudid [Hydromorphone Hcl] Nausea And Vomiting   Alendronate Other (See Comments)    Other reaction(s): Severe heartburn Other reaction(s): Severe heartburn   Alendronate Sodium     Other reaction(s): Severe heartburn   Azithromycin     Other reaction(s): made her feel bad Other reaction(s): made her feel bad   Gabapentin     Other reaction(s): Unknown   Hydromorphone     Other reaction(s): severe N V Other reaction(s): severe N V   Morphine And Related    Morphine Sulfate     Other reaction(s): vomitting   Paclitaxel     rash Other reaction(s): rash   Parathyroid Hormone (Recomb)     Other reaction(s): (radiation therapy for breast cancer)   Promethazine     Other reaction(s): hallucinations Other reaction(s): hallucinations   Promethazine Hcl     Other reaction(s): hallucinations   Sulfasalazine     Other reaction(s): Unknown   Tamiflu [Oseltamivir Phosphate]     rash   Tetanus Immune Globulin    Tetanus Toxoid     Other reaction(s): localized reaction   Tetanus Toxoid, Adsorbed     Other reaction(s): localized reaction Other reaction(s): localized reaction   Tetanus Toxoids    Tramadol Other (See Comments)   Oseltamivir Rash    Other reaction(s): Rash Other reaction(s): Rash Other reaction(s): Rash   Review of Systems: Negative except as noted  in the HPI.  Objective: No changes noted in today's physical examination. Vitals:   09/16/22 0808  BP: (!) 160/82   SANGEETA YOUSE is a pleasant 88 y.o. female WD, WN in NAD. AAO x 3.  Vascular Examination: CFT immediate b/l LE. Palpable DP/PT pulses b/l LE. Digital hair sparse b/l. Skin temperature gradient WNL b/l. No pain with calf compression b/l. No edema noted b/l. No cyanosis or clubbing noted b/l LE.  Dermatological Examination: Pedal skin is warm and supple b/l LE. No interdigital macerations noted b/l LE. Pedal skin is warm and supple b/l LE. No open wounds b/l LE. No interdigital macerations noted b/l LE. Toenails recently debrided.  Preulcerative lesion noted submet head 3 b/l. There is visible subdermal hemorrhage. There is no surrounding erythema, no edema, no drainage, no odor, no fluctuance.  Musculoskeletal Examination: Muscle strength 5/5 to all lower extremity muscle groups bilaterally. Severely plantarflexed metatarsals 1-5 b/l. Severe hammertoe deformity noted 1-5 bilaterally. Patient ambulates independent of any assistive aids. Wearing appropriate fitting shoe gear.  Neurological Examination: Protective sensation intact 5/5 intact bilaterally with 10g monofilament b/l. Vibratory sensation intact b/l. Proprioception intact bilaterally.  Assessment/Plan: 1. Pre-ulcerative calluses   2. Pain in both feet     No orders of the defined types were placed in this encounter.   -Consent given for treatment as described below: -Examined patient. -Continue supportive shoe gear daily. -Preulcerative lesion pared submet head 3 b/l utilizing sterile  scalpel blade. Total number pared=2. -Patient/POA to call should there be question/concern in the interim.   Return in about 3 months (around 12/16/2022).  Marzetta Board, DPM

## 2022-09-18 DIAGNOSIS — M5135 Other intervertebral disc degeneration, thoracolumbar region: Secondary | ICD-10-CM | POA: Diagnosis not present

## 2022-09-18 DIAGNOSIS — M0579 Rheumatoid arthritis with rheumatoid factor of multiple sites without organ or systems involvement: Secondary | ICD-10-CM | POA: Diagnosis not present

## 2022-09-21 ENCOUNTER — Encounter: Payer: Self-pay | Admitting: Podiatry

## 2022-10-09 ENCOUNTER — Ambulatory Visit: Payer: Medicare Other | Admitting: Podiatry

## 2022-10-10 DIAGNOSIS — L718 Other rosacea: Secondary | ICD-10-CM | POA: Diagnosis not present

## 2022-10-10 DIAGNOSIS — Z85828 Personal history of other malignant neoplasm of skin: Secondary | ICD-10-CM | POA: Diagnosis not present

## 2022-10-20 DIAGNOSIS — Z79899 Other long term (current) drug therapy: Secondary | ICD-10-CM | POA: Diagnosis not present

## 2022-10-20 DIAGNOSIS — D692 Other nonthrombocytopenic purpura: Secondary | ICD-10-CM | POA: Diagnosis not present

## 2022-10-20 DIAGNOSIS — E039 Hypothyroidism, unspecified: Secondary | ICD-10-CM | POA: Diagnosis not present

## 2022-10-20 DIAGNOSIS — K219 Gastro-esophageal reflux disease without esophagitis: Secondary | ICD-10-CM | POA: Diagnosis not present

## 2022-10-20 DIAGNOSIS — M810 Age-related osteoporosis without current pathological fracture: Secondary | ICD-10-CM | POA: Diagnosis not present

## 2022-10-20 DIAGNOSIS — E2839 Other primary ovarian failure: Secondary | ICD-10-CM | POA: Diagnosis not present

## 2022-10-20 DIAGNOSIS — E7800 Pure hypercholesterolemia, unspecified: Secondary | ICD-10-CM | POA: Diagnosis not present

## 2022-10-20 DIAGNOSIS — I10 Essential (primary) hypertension: Secondary | ICD-10-CM | POA: Diagnosis not present

## 2022-10-20 DIAGNOSIS — I2510 Atherosclerotic heart disease of native coronary artery without angina pectoris: Secondary | ICD-10-CM | POA: Diagnosis not present

## 2022-10-20 DIAGNOSIS — Z0000 Encounter for general adult medical examination without abnormal findings: Secondary | ICD-10-CM | POA: Diagnosis not present

## 2022-10-20 DIAGNOSIS — F330 Major depressive disorder, recurrent, mild: Secondary | ICD-10-CM | POA: Diagnosis not present

## 2022-10-20 DIAGNOSIS — R7303 Prediabetes: Secondary | ICD-10-CM | POA: Diagnosis not present

## 2022-10-22 DIAGNOSIS — M810 Age-related osteoporosis without current pathological fracture: Secondary | ICD-10-CM | POA: Diagnosis not present

## 2022-10-22 DIAGNOSIS — Z8489 Family history of other specified conditions: Secondary | ICD-10-CM | POA: Diagnosis not present

## 2022-10-22 DIAGNOSIS — K219 Gastro-esophageal reflux disease without esophagitis: Secondary | ICD-10-CM | POA: Diagnosis not present

## 2022-10-22 DIAGNOSIS — Z923 Personal history of irradiation: Secondary | ICD-10-CM | POA: Diagnosis not present

## 2022-10-23 DIAGNOSIS — E039 Hypothyroidism, unspecified: Secondary | ICD-10-CM | POA: Diagnosis not present

## 2022-10-23 DIAGNOSIS — E7800 Pure hypercholesterolemia, unspecified: Secondary | ICD-10-CM | POA: Diagnosis not present

## 2022-10-23 DIAGNOSIS — M810 Age-related osteoporosis without current pathological fracture: Secondary | ICD-10-CM | POA: Diagnosis not present

## 2022-10-23 DIAGNOSIS — R7303 Prediabetes: Secondary | ICD-10-CM | POA: Diagnosis not present

## 2022-10-29 ENCOUNTER — Encounter: Payer: Self-pay | Admitting: Podiatry

## 2022-10-29 ENCOUNTER — Ambulatory Visit (INDEPENDENT_AMBULATORY_CARE_PROVIDER_SITE_OTHER): Payer: Medicare Other | Admitting: Podiatry

## 2022-10-29 VITALS — BP 152/83

## 2022-10-29 DIAGNOSIS — M79672 Pain in left foot: Secondary | ICD-10-CM

## 2022-10-29 DIAGNOSIS — L84 Corns and callosities: Secondary | ICD-10-CM | POA: Diagnosis not present

## 2022-10-29 DIAGNOSIS — M069 Rheumatoid arthritis, unspecified: Secondary | ICD-10-CM | POA: Diagnosis not present

## 2022-10-29 DIAGNOSIS — M79671 Pain in right foot: Secondary | ICD-10-CM

## 2022-10-29 NOTE — Progress Notes (Signed)
Subjective:  Patient ID: [name], female    DOB: 1933-12-27,  MRN: [id number]  [name] presents to clinic today for preulcerative lesion(s) left lower extremity. Pain prevent(s) comfortable ambulation. Aggravating factor is weightbearing with and without shoegear.  Chief Complaint  Patient presents with   Nail Problem    RFC PCP-[facility] PCP VST-10/20/2022   New problem(s): None.   Patient states her husband's Parkinson's is getting worse. He is now in a wheelchair.  PCP is Jonathon Jordan, MD.  Allergies  Allergen Reactions   Dilaudid [Hydromorphone Hcl] Nausea And Vomiting   Alendronate Other (See Comments)    Other reaction(s): Severe heartburn Other reaction(s): Severe heartburn   Alendronate Sodium     Other reaction(s): Severe heartburn   Azithromycin     Other reaction(s): made her feel bad Other reaction(s): made her feel bad   Gabapentin     Other reaction(s): Unknown   Hydromorphone     Other reaction(s): severe N V Other reaction(s): severe N V   Morphine And Related    Morphine Sulfate     Other reaction(s): vomitting   Paclitaxel     rash Other reaction(s): rash   Parathyroid Hormone (Recomb)     Other reaction(s): (radiation therapy for breast cancer)   Promethazine     Other reaction(s): hallucinations Other reaction(s): hallucinations   Promethazine Hcl     Other reaction(s): hallucinations   Sulfasalazine     Other reaction(s): Unknown   Tamiflu [Oseltamivir Phosphate]     rash   Tetanus Immune Globulin    Tetanus Toxoid     Other reaction(s): localized reaction   Tetanus Toxoid, Adsorbed     Other reaction(s): localized reaction Other reaction(s): localized reaction   Tetanus Toxoids    Tramadol Other (See Comments)   Oseltamivir Rash    Other reaction(s): Rash Other reaction(s): Rash Other reaction(s): Rash    Review of Systems: Negative except as noted in the HPI.  Objective: No changes noted in today's physical  examination. Vitals:   10/29/22 0825 10/29/22 0834  BP: (!) 156/70 (!) 152/83   [name] is a pleasant 89 y.o. female WD, WN in NAD. AAO x 3.  Vascular Examination: CFT immediate b/l LE. Palpable DP/PT pulses b/l LE. Digital hair sparse b/l. Skin temperature gradient WNL b/l. No pain with calf compression b/l. No edema noted b/l. No cyanosis or clubbing noted b/l LE.  Dermatological Examination: Pedal skin is warm and supple b/l LE. No open wounds b/l LE. No interdigital macerations noted b/l LE.   Toenails are of adequate length.  Preulcerative lesion noted submet head 3 b/l. There is visible subdermal hemorrhage. There is no surrounding erythema, no edema, no drainage, no odor, no fluctuance.  Musculoskeletal Examination: Muscle strength 5/5 to all lower extremity muscle groups bilaterally. Severely plantarflexed metatarsals 1-5 b/l. Severe hammertoe deformity noted 1-5 bilaterally.   Patient ambulates independent of any assistive aids. Wearing appropriate fitting shoe gear.  Neurological Examination: Protective sensation intact 5/5 intact bilaterally with 10g monofilament b/l. Vibratory sensation intact b/l. Proprioception intact bilaterally.  Assessment/Plan: 1. Pre-ulcerative calluses   2. Pain in both feet   3. Rheumatoid arthritis of left foot, unspecified whether rheumatoid factor present ([place])     No orders of the defined types were placed in this encounter.  -Patient was evaluated and treated. All patient's and/or POA's questions/concerns answered on today's visit. -Continue offloaded insoles daily. -Patient to continue soft, supportive shoe gear daily. -Preulcerative lesion  pared submet head 3 left foot and submet head 3 right foot utilizing sterile scalpel blade. Total number pared=2. -Patient/POA to call should there be question/concern in the interim.   Return in about 6 weeks (around 12/10/2022).  Marzetta Board, DPM

## 2022-11-25 DIAGNOSIS — Z6824 Body mass index (BMI) 24.0-24.9, adult: Secondary | ICD-10-CM | POA: Diagnosis not present

## 2022-11-25 DIAGNOSIS — M0579 Rheumatoid arthritis with rheumatoid factor of multiple sites without organ or systems involvement: Secondary | ICD-10-CM | POA: Diagnosis not present

## 2022-11-25 DIAGNOSIS — Z79899 Other long term (current) drug therapy: Secondary | ICD-10-CM | POA: Diagnosis not present

## 2022-11-25 DIAGNOSIS — M1991 Primary osteoarthritis, unspecified site: Secondary | ICD-10-CM | POA: Diagnosis not present

## 2022-12-08 ENCOUNTER — Ambulatory Visit (INDEPENDENT_AMBULATORY_CARE_PROVIDER_SITE_OTHER): Payer: Medicare Other | Admitting: Podiatry

## 2022-12-08 VITALS — BP 153/76

## 2022-12-08 DIAGNOSIS — M79671 Pain in right foot: Secondary | ICD-10-CM

## 2022-12-08 DIAGNOSIS — L84 Corns and callosities: Secondary | ICD-10-CM | POA: Diagnosis not present

## 2022-12-08 DIAGNOSIS — M79672 Pain in left foot: Secondary | ICD-10-CM

## 2022-12-08 DIAGNOSIS — M069 Rheumatoid arthritis, unspecified: Secondary | ICD-10-CM | POA: Diagnosis not present

## 2022-12-08 NOTE — Progress Notes (Unsigned)
  Subjective:  Patient ID: [name], female    DOB: July 05, 1934,  MRN: [id number]  [name] presents to clinic today for {jgcomplaint:23593}  Chief Complaint  Patient presents with   Nail Problem    RFC PCP-[facility]ters PCP VST-Can not remember    New problem(s): None. {jgcomplaint:23593}  PCP is Jonathon Jordan, MD.  Allergies  Allergen Reactions   Dilaudid [Hydromorphone Hcl] Nausea And Vomiting   Alendronate Other (See Comments)    Other reaction(s): Severe heartburn Other reaction(s): Severe heartburn   Alendronate Sodium     Other reaction(s): Severe heartburn   Azithromycin     Other reaction(s): made her feel bad Other reaction(s): made her feel bad   Gabapentin     Other reaction(s): Unknown   Hydromorphone     Other reaction(s): severe N V Other reaction(s): severe N V   Morphine And Related    Morphine Sulfate     Other reaction(s): vomitting   Paclitaxel     rash Other reaction(s): rash   Parathyroid Hormone (Recomb)     Other reaction(s): (radiation therapy for breast cancer)   Promethazine     Other reaction(s): hallucinations Other reaction(s): hallucinations   Promethazine Hcl     Other reaction(s): hallucinations   Sulfasalazine     Other reaction(s): Unknown   Tamiflu [Oseltamivir Phosphate]     rash   Tetanus Immune Globulin    Tetanus Toxoid     Other reaction(s): localized reaction   Tetanus Toxoid, Adsorbed     Other reaction(s): localized reaction Other reaction(s): localized reaction   Tetanus Toxoids    Tramadol Other (See Comments)   Oseltamivir Rash    Other reaction(s): Rash Other reaction(s): Rash Other reaction(s): Rash   Review of Systems: Negative except as noted in the HPI.  Objective: No changes noted in today's physical examination. There were no vitals filed for this visit. [name] is a pleasant 89 y.o. female {jgbodyhabitus:24098} AAO x 3.  Assessment/Plan: No diagnosis found.  No orders of the  defined types were placed in this encounter.   None {Jgplan:23602::"-Patient/POA to call should there be question/concern in the interim."}   No follow-ups on file.  Marzetta Board, DPM

## 2022-12-10 ENCOUNTER — Ambulatory Visit: Payer: Medicare Other | Admitting: Podiatry

## 2022-12-11 ENCOUNTER — Encounter: Payer: Self-pay | Admitting: Podiatry

## 2022-12-15 ENCOUNTER — Other Ambulatory Visit: Payer: Self-pay | Admitting: Family Medicine

## 2022-12-15 DIAGNOSIS — Z1231 Encounter for screening mammogram for malignant neoplasm of breast: Secondary | ICD-10-CM

## 2022-12-19 ENCOUNTER — Other Ambulatory Visit: Payer: Medicare Other

## 2022-12-25 DIAGNOSIS — M5135 Other intervertebral disc degeneration, thoracolumbar region: Secondary | ICD-10-CM | POA: Diagnosis not present

## 2022-12-25 DIAGNOSIS — M5412 Radiculopathy, cervical region: Secondary | ICD-10-CM | POA: Diagnosis not present

## 2023-01-14 ENCOUNTER — Ambulatory Visit (INDEPENDENT_AMBULATORY_CARE_PROVIDER_SITE_OTHER): Payer: Medicare Other | Admitting: Podiatry

## 2023-01-14 ENCOUNTER — Encounter: Payer: Self-pay | Admitting: Podiatry

## 2023-01-14 DIAGNOSIS — M05771 Rheumatoid arthritis with rheumatoid factor of right ankle and foot without organ or systems involvement: Secondary | ICD-10-CM | POA: Diagnosis not present

## 2023-01-14 DIAGNOSIS — M79671 Pain in right foot: Secondary | ICD-10-CM

## 2023-01-14 DIAGNOSIS — M05772 Rheumatoid arthritis with rheumatoid factor of left ankle and foot without organ or systems involvement: Secondary | ICD-10-CM

## 2023-01-14 DIAGNOSIS — L84 Corns and callosities: Secondary | ICD-10-CM

## 2023-01-14 DIAGNOSIS — M79672 Pain in left foot: Secondary | ICD-10-CM

## 2023-01-14 NOTE — Progress Notes (Signed)
Subjective:  Patient ID: [name], female    DOB: 03-13-1934,  MRN: [id number]  [name] presents to clinic today for preulcerative lesion(s) b/l feet left >right. Pain prevent(s) comfortable ambulation. Aggravating factor is weightbearing with and without shoegear.  Chief Complaint  Patient presents with   Painful preulcerative calluses in patient with metatarsal deformity and h/o RA    RFC   New problem(s): None.   Patient states she lost her husband one month ago. Her daughter is visiting from [place].  PCP is Jonathon Jordan, MD.  Allergies  Allergen Reactions   Dilaudid [Hydromorphone Hcl] Nausea And Vomiting   Alendronate Other (See Comments)    Other reaction(s): Severe heartburn Other reaction(s): Severe heartburn   Alendronate Sodium     Other reaction(s): Severe heartburn   Azithromycin     Other reaction(s): made her feel bad Other reaction(s): made her feel bad   Gabapentin     Other reaction(s): Unknown   Hydromorphone     Other reaction(s): severe N V Other reaction(s): severe N V   Morphine And Related    Morphine Sulfate     Other reaction(s): vomitting   Paclitaxel     rash Other reaction(s): rash   Parathyroid Hormone (Recomb)     Other reaction(s): (radiation therapy for breast cancer)   Promethazine     Other reaction(s): hallucinations Other reaction(s): hallucinations   Promethazine Hcl     Other reaction(s): hallucinations   Sulfasalazine     Other reaction(s): Unknown   Tamiflu [Oseltamivir Phosphate]     rash   Tetanus Immune Globulin    Tetanus Toxoid     Other reaction(s): localized reaction   Tetanus Toxoid, Adsorbed     Other reaction(s): localized reaction Other reaction(s): localized reaction   Tetanus Toxoids    Tramadol Other (See Comments)   Oseltamivir Rash    Other reaction(s): Rash Other reaction(s): Rash Other reaction(s): Rash    Review of Systems: Negative except as noted in the HPI.  Objective: No  changes noted in today's physical examination. There were no vitals filed for this visit.  MYRTH SHANN is a pleasant 89 y.o. female WD, WN in NAD. AAO x 3.  Neurovascular Examination: Neurovascular status unchanged: Pedal sensation intact b/l. CFT immediate b/l LE. Palpable DP/PT pulses b/l LE. Digital hair sparse b/l. Skin temperature gradient WNL b/l. No pain with calf compression b/l. No edema noted b/l. No cyanosis or clubbing noted b/l LE.  Dermatological Examination: Pedal skin is warm and supple b/l LE. No open wounds b/l LE. No interdigital macerations noted b/l LE.   Toenails are of adequate length.  Preulcerative lesion noted submet head 3 b/l. There is visible subdermal hemorrhage. There is no surrounding erythema, no edema, no drainage, no odor, no fluctuance.  Musculoskeletal Examination: Muscle strength 5/5 to all lower extremity muscle groups bilaterally. Severely plantarflexed metatarsals 1-5 b/l. Severe hammertoe deformity noted 1-5 bilaterally.   Patient ambulates independent of any assistive aids. Wearing appropriate fitting shoe gear.  Assessment/Plan: 1. Pre-ulcerative calluses   2. Pain in both feet   3. Rheumatoid arthritis involving both feet with positive rheumatoid factor     -Patient was evaluated and treated. All patient's and/or POA's questions/concerns answered on today's visit. -Medicare ABN on file for paring of corn(s)/callus(es)/porokeratos(es) due to frequency of visits. Copy in patient chart. -Continue supportive shoe gear daily. -Preulcerative lesion pared submet head 3 b/l utilizing sterile scalpel blade. Total number pared=2. -Patient/POA to call  should there be question/concern in the interim.   Return in about 5 weeks (around 02/18/2023).  Marzetta Board, DPM

## 2023-01-16 DIAGNOSIS — M810 Age-related osteoporosis without current pathological fracture: Secondary | ICD-10-CM | POA: Diagnosis not present

## 2023-01-30 ENCOUNTER — Ambulatory Visit
Admission: RE | Admit: 2023-01-30 | Discharge: 2023-01-30 | Disposition: A | Discharge status: Home / Self Care | Payer: Medicare Other | Source: Ambulatory Visit | Attending: Family Medicine | Admitting: Family Medicine

## 2023-01-30 DIAGNOSIS — Z1231 Encounter for screening mammogram for malignant neoplasm of breast: Secondary | ICD-10-CM

## 2023-02-17 DIAGNOSIS — L821 Other seborrheic keratosis: Secondary | ICD-10-CM | POA: Diagnosis not present

## 2023-02-17 DIAGNOSIS — L570 Actinic keratosis: Secondary | ICD-10-CM | POA: Diagnosis not present

## 2023-02-17 DIAGNOSIS — Z85828 Personal history of other malignant neoplasm of skin: Secondary | ICD-10-CM | POA: Diagnosis not present

## 2023-02-18 ENCOUNTER — Ambulatory Visit (INDEPENDENT_AMBULATORY_CARE_PROVIDER_SITE_OTHER): Payer: Medicare Other | Admitting: Podiatry

## 2023-02-18 ENCOUNTER — Encounter: Payer: Self-pay | Admitting: Podiatry

## 2023-02-18 VITALS — BP 149/96

## 2023-02-18 DIAGNOSIS — L97521 Non-pressure chronic ulcer of other part of left foot limited to breakdown of skin: Secondary | ICD-10-CM | POA: Diagnosis not present

## 2023-02-18 DIAGNOSIS — M79672 Pain in left foot: Secondary | ICD-10-CM

## 2023-02-18 DIAGNOSIS — L84 Corns and callosities: Secondary | ICD-10-CM

## 2023-02-18 DIAGNOSIS — M069 Rheumatoid arthritis, unspecified: Secondary | ICD-10-CM

## 2023-02-18 NOTE — Progress Notes (Signed)
Subjective:  Patient ID: [name], female    DOB: 12-25-1933,  MRN: [id number]  [name] presents to clinic today for follow up chronic preulcerative lesions bilaterally left>right; complicated by chronic immunosuppressive therapy for RA . Patient states left foot is sore which is her baseline. States son purchased razors and she gently trims it between visits. Denies any breaks in skin/trauma to area. Denies any fever, chills, nausea or vomiting. Chief Complaint  Patient presents with   Callouses    B/L feet,PCPs Type   Mila Palmer, MD General,last visit-02/11/23,not diabetic     New problem(s): None.   PCP is Mila Palmer, MD.  Allergies  Allergen Reactions   Dilaudid [Hydromorphone Hcl] Nausea And Vomiting   Hydromorphone Hcl Other (See Comments)   Alendronate Other (See Comments)    Other reaction(s): Severe heartburn Other reaction(s): Severe heartburn   Alendronate Sodium     Other reaction(s): Severe heartburn   Azithromycin     Other reaction(s): made her feel bad Other reaction(s): made her feel bad   Gabapentin Other (See Comments)    Other reaction(s): Unknown   Hydromorphone     Other reaction(s): severe N V Other reaction(s): severe N V   Morphine And Related    Morphine Sulfate Other (See Comments)    Other reaction(s): vomitting   Paclitaxel Other (See Comments)    rash  Other reaction(s): rash   Parathyroid Hormone (Recomb)     Other reaction(s): (radiation therapy for breast cancer)   Promethazine Other (See Comments)    Other reaction(s): hallucinations   Promethazine Hcl     Other reaction(s): hallucinations   Sulfasalazine Other (See Comments)    Other reaction(s): Unknown   Tamiflu [Oseltamivir Phosphate]     rash   Tetanus Immune Globulin    Tetanus Toxoid Swelling and Other (See Comments)    Other reaction(s): localized reaction   Tetanus Toxoid, Adsorbed     Other reaction(s): localized reaction Other reaction(s):  localized reaction   Tetanus Toxoids    Tramadol Other (See Comments)   Oseltamivir Rash and Other (See Comments)    Other reaction(s): Rash    Review of Systems: Negative except as noted in the HPI.  Objective: No changes noted in today's physical examination. Vitals:   02/18/23 0918  BP: (!) 149/96   [name] is a pleasant 89 y.o. female WD, WN in NAD. AAO x 3.  Neurovascular Examination: Neurovascular status unchanged: Pedal sensation intact b/l. CFT immediate b/l LE. Palpable DP/PT pulses b/l LE. Digital hair sparse b/l. Skin temperature gradient WNL b/l. No pain with calf compression b/l. No edema noted b/l. No cyanosis or clubbing noted b/l LE.  Dermatological Examination: Pedal skin is warm and supple b/l LE. No open wounds b/l LE. No interdigital macerations noted b/l LE.   Toenails are of adequate length.  Preulcerative lesion noted submet head left foot with tenderness to palpation. There is visible subdermal hemorrhage. There is no surrounding erythema, no edema, no drainage, no odor, no fluctuance. Postdebridement, there is a partial thickness ulceration noted measuring 0.2 x 0.1 x 0.1 cm. No underlying erythema, no edema, no drainage, no fluctuance, no tracking nor tunneling.  Musculoskeletal Examination: Muscle strength 5/5 to all lower extremity muscle groups bilaterally. Severely plantarflexed metatarsals 1-5 b/l. Severe hammertoe deformity noted 1-5 bilaterally.   Patient ambulates independent of any assistive aids. Wearing appropriate fitting shoe gear.  Assessment/Plan: 1. Skin ulcer of left foot, limited to breakdown of skin (  HCC)   2. Rheumatoid arthritis of left foot, unspecified whether rheumatoid factor present (HCC)     -Consent given for treatment as described below: -Examined patient. -Partial thickness ulcer submet head 3 debrided with sterile scalpel blade. Cleansed with wound cleanser. TAO and light dressing applied. She is to keep area clean  and apply Iodosorb Gel once daily. She will follow up with Dr. Ralene Cork in 2 weeks; sooner if any problems arise . -Offloaded left shoe insert with felt aperture pad. -Patient/POA to call should there be question/concern in the interim.   Return in about 5 weeks (around 03/25/2023).  Freddie Breech, DPM

## 2023-02-24 DIAGNOSIS — M0579 Rheumatoid arthritis with rheumatoid factor of multiple sites without organ or systems involvement: Secondary | ICD-10-CM | POA: Diagnosis not present

## 2023-03-04 ENCOUNTER — Encounter: Payer: Self-pay | Admitting: Podiatry

## 2023-03-04 ENCOUNTER — Ambulatory Visit (INDEPENDENT_AMBULATORY_CARE_PROVIDER_SITE_OTHER): Payer: Medicare Other | Admitting: Podiatry

## 2023-03-04 DIAGNOSIS — M069 Rheumatoid arthritis, unspecified: Secondary | ICD-10-CM

## 2023-03-04 DIAGNOSIS — L97521 Non-pressure chronic ulcer of other part of left foot limited to breakdown of skin: Secondary | ICD-10-CM | POA: Diagnosis not present

## 2023-03-04 DIAGNOSIS — M216X9 Other acquired deformities of unspecified foot: Secondary | ICD-10-CM

## 2023-03-04 NOTE — Progress Notes (Signed)
  Subjective:  Patient ID: [name], female    DOB: June 29, 1934,   MRN: [id number]  Chief Complaint  Patient presents with   Arthritis    Rm 24 Pt wants to come in office to ask Dr. Ralene Cork some questions her children needed answered regarding her arthritis of her feet left over right.    Callouses    Left foot callus    89 y.o. female presents for follow-up of wound that  had reopened.  She has been following with Dr. Eloy End and has been wearing regular shoe. Keeping the area dressed as instructed. Here to discuss questions from children as well. . . Denies any other pedal complaints. Denies n/v/f/c.   Past Medical History:  Diagnosis Date   Abnormal cardiovascular stress test 12/2013   s/p cath with mild nonobstructive CAD normal EF - managed medically   Breast cancer (HCC) 2008   RT, chemo, lumpectomy   Diverticulitis    DVT (deep vein thrombosis) in pregnancy    site of PICC catheter   HTN (hypertension)    Hyperlipidemia    Hypothyroidism    Kidney tumor 2010   RFA   Personal history of chemotherapy    Personal history of radiation therapy    Prediabetes    Rheumatoid arthritis(714.0)    UTI (lower urinary tract infection)     Objective:  Physical Exam: Vascular: DP/PT pulses 2/4 bilateral. CFT <3 seconds. Normal hair growth on digits. No edema.  Skin. No lacerations or abrasions bilateral feet. Hyperkeratotic tissue noted sub 3rd metatarsal with underlying wound about 0.1 cm x 0.2 cm x 0.1cm  . No erythema edema or purulence noted.  Musculoskeletal: MMT 5/5 bilateral lower extremities in DF, PF, Inversion and Eversion. Deceased ROM in DF of ankle joint.  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  Neurological: Sensation intact to light touch.   Assessment:   1. Skin ulcer of left foot, limited to breakdown of skin (HCC)   2. Rheumatoid arthritis of left foot, unspecified whether rheumatoid factor present (HCC)   3. Plantar  flexed metatarsal, unspecified laterality         Plan:  Patient was evaluated and treated and all questions answered. Ulcer left plantar foot  limited to breakdown of skin.  -Debridement as below. -Dressed with betadine, DSD. -Off-loading with surgical shoe. -No abx indicated.  -Discussed glucose control and proper protein-rich diet.  -Discussed if any worsening redness, pain, fever or chills to call or may need to report to the emergency room. Patient expressed understanding.  Discussed possible surgical intervention but patient concerned about anesthesia. Discussed trying CMO accomadative types to offload once ulceration healed.  All question answered.   Procedure: Excisional Debridement of Wound Rationale: Removal of non-viable soft tissue from the wound to promote healing.  Anesthesia: none Pre-Debridement Wound Measurements: Overlying callus  Post-Debridement Wound Measurements: 0.1 cm x 0.2 cm x 0.1 cm  Type of Debridement: Sharp Excisional Tissue Removed: Non-viable soft tissue Depth of Debridement: subcutaneous tissue. Technique: Sharp excisional debridement to bleeding, viable wound base.  Dressing: Dry, sterile, compression dressing. Disposition: Patient tolerated procedure well. Patient to return in 2 week for follow-up.  Return in about 2 weeks (around 03/18/2023) for wound check.    Return in about 2 weeks (around 03/18/2023) for wound check.    Return in about 2 weeks (around 03/18/2023) for wound check.   [name], DPM

## 2023-03-18 ENCOUNTER — Ambulatory Visit (INDEPENDENT_AMBULATORY_CARE_PROVIDER_SITE_OTHER): Payer: Medicare Other | Admitting: Podiatry

## 2023-03-18 ENCOUNTER — Encounter: Payer: Self-pay | Admitting: Podiatry

## 2023-03-18 DIAGNOSIS — L84 Corns and callosities: Secondary | ICD-10-CM

## 2023-03-18 DIAGNOSIS — L97521 Non-pressure chronic ulcer of other part of left foot limited to breakdown of skin: Secondary | ICD-10-CM

## 2023-03-18 DIAGNOSIS — M069 Rheumatoid arthritis, unspecified: Secondary | ICD-10-CM | POA: Diagnosis not present

## 2023-03-18 DIAGNOSIS — M216X9 Other acquired deformities of unspecified foot: Secondary | ICD-10-CM

## 2023-03-18 NOTE — Progress Notes (Signed)
  Subjective:  Patient ID: NIMCO MCHENRY, female    DOB: [birth year],   MRN: [id number]  Chief Complaint  Patient presents with   Wound Check    Wound check left foot     89 y.o. female presents for follow-up of wound. Keeping the area dressed as instructed.  She has been wearing orthotics from [employer]. Here today with her son. Denies any other pedal complaints. Denies n/v/f/c.   Past Medical History:  Diagnosis Date   Abnormal cardiovascular stress test 12/2013   s/p cath with mild nonobstructive CAD normal EF - managed medically   Breast cancer (HCC) 2008   RT, chemo, lumpectomy   Diverticulitis    DVT (deep vein thrombosis) in pregnancy    site of PICC catheter   HTN (hypertension)    Hyperlipidemia    Hypothyroidism    Kidney tumor 2010   RFA   Personal history of chemotherapy    Personal history of radiation therapy    Prediabetes    Rheumatoid arthritis(714.0)    UTI (lower urinary tract infection)     Objective:  Physical Exam: Vascular: DP/PT pulses 2/4 bilateral. CFT <3 seconds. Normal hair growth on digits. No edema.  Skin. No lacerations or abrasions bilateral feet. Hyperkeratotic tissue noted sub 3rd metatarsal with underlying wound about 0.1 cm x 0.2 cm x 0.1cm  . No erythema edema or purulence noted.  Musculoskeletal: MMT 5/5 bilateral lower extremities in DF, PF, Inversion and Eversion. Deceased ROM in DF of ankle joint.  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  Neurological: Sensation intact to light touch.   Assessment:   1. Pre-ulcerative calluses   2. Skin ulcer of left foot, limited to breakdown of skin (HCC)   3. Rheumatoid arthritis of left foot, unspecified whether rheumatoid factor present (HCC)   4. Plantar flexed metatarsal, unspecified laterality          Plan:  Patient was evaluated and treated and all questions answered. Ulcer left plantar foot healed.  -Debridement of hyperkeratotic tissue.   -Mechanically debrided all nails 1-5 bilateral using sterile nail nipper and filed with dremel without incident  -Measured today for custom orthotics to offload the areas of concern.  -Answered all patient questions -Patient to return  in 4 weeks for at risk foot care -Patient advised to call the office if any problems or questions arise in the meantime.   No follow-ups on file.    No follow-ups on file.    No follow-ups on file.   [name], DPM

## 2023-03-19 DIAGNOSIS — M545 Low back pain, unspecified: Secondary | ICD-10-CM | POA: Diagnosis not present

## 2023-03-19 DIAGNOSIS — M791 Myalgia, unspecified site: Secondary | ICD-10-CM | POA: Diagnosis not present

## 2023-03-25 ENCOUNTER — Ambulatory Visit: Payer: Medicare Other | Admitting: Podiatry

## 2023-03-31 DIAGNOSIS — L719 Rosacea, unspecified: Secondary | ICD-10-CM | POA: Diagnosis not present

## 2023-03-31 DIAGNOSIS — I10 Essential (primary) hypertension: Secondary | ICD-10-CM | POA: Diagnosis not present

## 2023-03-31 DIAGNOSIS — F330 Major depressive disorder, recurrent, mild: Secondary | ICD-10-CM | POA: Diagnosis not present

## 2023-03-31 DIAGNOSIS — L718 Other rosacea: Secondary | ICD-10-CM | POA: Diagnosis not present

## 2023-04-20 ENCOUNTER — Encounter: Payer: Self-pay | Admitting: Podiatry

## 2023-04-20 ENCOUNTER — Ambulatory Visit (INDEPENDENT_AMBULATORY_CARE_PROVIDER_SITE_OTHER): Payer: Medicare Other | Admitting: Podiatry

## 2023-04-20 DIAGNOSIS — L84 Corns and callosities: Secondary | ICD-10-CM

## 2023-04-20 DIAGNOSIS — M069 Rheumatoid arthritis, unspecified: Secondary | ICD-10-CM | POA: Diagnosis not present

## 2023-04-20 DIAGNOSIS — M216X9 Other acquired deformities of unspecified foot: Secondary | ICD-10-CM | POA: Diagnosis not present

## 2023-04-20 NOTE — Progress Notes (Signed)
  Subjective:  Patient ID: [name], female    DOB: 30-Nov-1933,   MRN: [id number]  Chief Complaint  Patient presents with   Wound Check    Wound check patient states wound is much better since the last visit    Nail Problem     Routine foot care/ callus trim     89 y.o. female presents for follow-up of wound. Relates still doing well and no issues She has been wearing orthotics from [employer].  Denies any other pedal complaints. Denies n/v/f/c.   Past Medical History:  Diagnosis Date   Abnormal cardiovascular stress test 12/2013   s/p cath with mild nonobstructive CAD normal EF - managed medically   Breast cancer (HCC) 2008   RT, chemo, lumpectomy   Diverticulitis    DVT (deep vein thrombosis) in pregnancy    site of PICC catheter   HTN (hypertension)    Hyperlipidemia    Hypothyroidism    Kidney tumor 2010   RFA   Personal history of chemotherapy    Personal history of radiation therapy    Prediabetes    Rheumatoid arthritis(714.0)    UTI (lower urinary tract infection)     Objective:  Physical Exam: Vascular: DP/PT pulses 2/4 bilateral. CFT <3 seconds. Normal hair growth on digits. No edema.  Skin. No lacerations or abrasions bilateral feet. Hyperkeratotic tissue noted sub 3rd metatarsal with underlying wound  healed. Hyperkeratotic tissue on right noted as well under third metatrsal Musculoskeletal: MMT 5/5 bilateral lower extremities in DF, PF, Inversion and Eversion. Deceased ROM in DF of ankle joint.  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  Neurological: Sensation intact to light touch.   Assessment:   1. Pre-ulcerative calluses   2. Rheumatoid arthritis of left foot, unspecified whether rheumatoid factor present (HCC)   3. Plantar flexed metatarsal, unspecified laterality           Plan:  Patient was evaluated and treated and all questions answered. Ulcer left plantar foot healed.  -Debridement of  hyperkeratotic tissue bilateral third metatarsals without incident using chisel.  Awaiting orthotics.  -Answered all patient questions -Patient to return  in 4 weeks for at risk foot care -Patient advised to call the office if any problems or questions arise in the meantime.   Return in about 4 weeks (around 05/18/2023) for rfc.    Return in about 4 weeks (around 05/18/2023) for rfc.    Return in about 4 weeks (around 05/18/2023) for rfc.   [name], DPM

## 2023-04-24 DIAGNOSIS — K120 Recurrent oral aphthae: Secondary | ICD-10-CM | POA: Diagnosis not present

## 2023-04-30 DIAGNOSIS — L570 Actinic keratosis: Secondary | ICD-10-CM | POA: Diagnosis not present

## 2023-04-30 DIAGNOSIS — L728 Other follicular cysts of the skin and subcutaneous tissue: Secondary | ICD-10-CM | POA: Diagnosis not present

## 2023-04-30 DIAGNOSIS — L718 Other rosacea: Secondary | ICD-10-CM | POA: Diagnosis not present

## 2023-05-13 DIAGNOSIS — H524 Presbyopia: Secondary | ICD-10-CM | POA: Diagnosis not present

## 2023-05-13 DIAGNOSIS — Z961 Presence of intraocular lens: Secondary | ICD-10-CM | POA: Diagnosis not present

## 2023-05-13 DIAGNOSIS — H10413 Chronic giant papillary conjunctivitis, bilateral: Secondary | ICD-10-CM | POA: Diagnosis not present

## 2023-05-18 ENCOUNTER — Encounter: Payer: Self-pay | Admitting: Podiatry

## 2023-05-18 ENCOUNTER — Ambulatory Visit (INDEPENDENT_AMBULATORY_CARE_PROVIDER_SITE_OTHER): Payer: Medicare Other | Admitting: Podiatry

## 2023-05-18 VITALS — BP 133/74 | HR 82 | Ht 61.0 in | Wt 134.0 lb

## 2023-05-18 DIAGNOSIS — L84 Corns and callosities: Secondary | ICD-10-CM | POA: Diagnosis not present

## 2023-05-18 DIAGNOSIS — M069 Rheumatoid arthritis, unspecified: Secondary | ICD-10-CM

## 2023-05-18 DIAGNOSIS — M216X9 Other acquired deformities of unspecified foot: Secondary | ICD-10-CM

## 2023-05-18 NOTE — Progress Notes (Signed)
  Subjective:  Patient ID: [name], female    DOB: 12/15/1933,   MRN: [id number]  Chief Complaint  Patient presents with   Callouses    Trim callouses today    Foot Orthotics    Here for Orthotics as well states they should be ready today     89 y.o. female presents for follow-up of pre-ulcerative calluses Relates still doing well and no issues She has been wearing new brooks shoes which help  Denies any other pedal complaints. Denies n/v/f/c.   Past Medical History:  Diagnosis Date   Abnormal cardiovascular stress test 12/2013   s/p cath with mild nonobstructive CAD normal EF - managed medically   Breast cancer (HCC) 2008   RT, chemo, lumpectomy   Diverticulitis    DVT (deep vein thrombosis) in pregnancy    site of PICC catheter   HTN (hypertension)    Hyperlipidemia    Hypothyroidism    Kidney tumor 2010   RFA   Personal history of chemotherapy    Personal history of radiation therapy    Prediabetes    Rheumatoid arthritis(714.0)    UTI (lower urinary tract infection)     Objective:  Physical Exam: Vascular: DP/PT pulses 2/4 bilateral. CFT <3 seconds. Normal hair growth on digits. No edema.  Skin. No lacerations or abrasions bilateral feet. Hyperkeratotic tissue noted sub 3rd metatarsal with underlying wound  healed. Hyperkeratotic tissue on right noted as well under third metatrsal Musculoskeletal: MMT 5/5 bilateral lower extremities in DF, PF, Inversion and Eversion. Deceased ROM in DF of ankle joint.  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  Neurological: Sensation intact to light touch.   Assessment:   1. Pre-ulcerative calluses   2. Rheumatoid arthritis of left foot, unspecified whether rheumatoid factor present (HCC)   3. Plantar flexed metatarsal, unspecified laterality            Plan:  Patient was evaluated and treated and all questions answered. Ulcer left plantar foot healed.  -Debridement of  hyperkeratotic tissue bilateral third metatarsals without incident using chisel.  -Orthotics dispensed today and advised on care.  -Answered all patient questions -Patient to return  in 4 weeks for at risk foot care -Patient advised to call the office if any problems or questions arise in the meantime.   Return in about 1 month (around 06/18/2023) for rfc.    Return in about 1 month (around 06/18/2023) for rfc.    Return in about 1 month (around 06/18/2023) for rfc.   [name], DPM

## 2023-05-25 DIAGNOSIS — L570 Actinic keratosis: Secondary | ICD-10-CM | POA: Diagnosis not present

## 2023-05-25 DIAGNOSIS — Z85828 Personal history of other malignant neoplasm of skin: Secondary | ICD-10-CM | POA: Diagnosis not present

## 2023-05-25 DIAGNOSIS — L578 Other skin changes due to chronic exposure to nonionizing radiation: Secondary | ICD-10-CM | POA: Diagnosis not present

## 2023-05-25 DIAGNOSIS — L821 Other seborrheic keratosis: Secondary | ICD-10-CM | POA: Diagnosis not present

## 2023-05-26 DIAGNOSIS — Z79899 Other long term (current) drug therapy: Secondary | ICD-10-CM | POA: Diagnosis not present

## 2023-05-26 DIAGNOSIS — Z6824 Body mass index (BMI) 24.0-24.9, adult: Secondary | ICD-10-CM | POA: Diagnosis not present

## 2023-05-26 DIAGNOSIS — M1991 Primary osteoarthritis, unspecified site: Secondary | ICD-10-CM | POA: Diagnosis not present

## 2023-05-26 DIAGNOSIS — M0579 Rheumatoid arthritis with rheumatoid factor of multiple sites without organ or systems involvement: Secondary | ICD-10-CM | POA: Diagnosis not present

## 2023-06-08 DIAGNOSIS — M5136 Other intervertebral disc degeneration, lumbar region: Secondary | ICD-10-CM | POA: Diagnosis not present

## 2023-06-08 DIAGNOSIS — M791 Myalgia, unspecified site: Secondary | ICD-10-CM | POA: Diagnosis not present

## 2023-06-08 DIAGNOSIS — M545 Low back pain, unspecified: Secondary | ICD-10-CM | POA: Diagnosis not present

## 2023-06-16 ENCOUNTER — Ambulatory Visit
Admission: RE | Admit: 2023-06-16 | Discharge: 2023-06-16 | Disposition: A | Discharge status: Home / Self Care | Payer: Medicare Other | Source: Ambulatory Visit | Attending: Family Medicine | Admitting: Family Medicine

## 2023-06-16 DIAGNOSIS — M069 Rheumatoid arthritis, unspecified: Secondary | ICD-10-CM | POA: Diagnosis not present

## 2023-06-16 DIAGNOSIS — Z90722 Acquired absence of ovaries, bilateral: Secondary | ICD-10-CM | POA: Diagnosis not present

## 2023-06-16 DIAGNOSIS — E349 Endocrine disorder, unspecified: Secondary | ICD-10-CM | POA: Diagnosis not present

## 2023-06-16 DIAGNOSIS — M8588 Other specified disorders of bone density and structure, other site: Secondary | ICD-10-CM | POA: Diagnosis not present

## 2023-06-16 DIAGNOSIS — E2839 Other primary ovarian failure: Secondary | ICD-10-CM

## 2023-06-17 ENCOUNTER — Ambulatory Visit (INDEPENDENT_AMBULATORY_CARE_PROVIDER_SITE_OTHER): Payer: Medicare Other | Admitting: Podiatry

## 2023-06-17 ENCOUNTER — Encounter: Payer: Self-pay | Admitting: Podiatry

## 2023-06-17 DIAGNOSIS — Z872 Personal history of diseases of the skin and subcutaneous tissue: Secondary | ICD-10-CM

## 2023-06-17 DIAGNOSIS — L84 Corns and callosities: Secondary | ICD-10-CM | POA: Diagnosis not present

## 2023-06-17 DIAGNOSIS — M069 Rheumatoid arthritis, unspecified: Secondary | ICD-10-CM

## 2023-06-17 NOTE — Progress Notes (Signed)
  Subjective:  Patient ID: [name], female    DOB: Feb 07, 1934,   MRN: [id number]  No chief complaint on file.   89 y.o. female presents for follow-up of pre-ulcerative calluses Relates still doing well and no issues She has been wearing new brooks shoes which help. She is breaking in her new orthotics.   Denies any other pedal complaints. Denies n/v/f/c.   Past Medical History:  Diagnosis Date   Abnormal cardiovascular stress test 12/2013   s/p cath with mild nonobstructive CAD normal EF - managed medically   Breast cancer (HCC) 2008   RT, chemo, lumpectomy   Diverticulitis    DVT (deep vein thrombosis) in pregnancy    site of PICC catheter   HTN (hypertension)    Hyperlipidemia    Hypothyroidism    Kidney tumor 2010   RFA   Personal history of chemotherapy    Personal history of radiation therapy    Prediabetes    Rheumatoid arthritis(714.0)    UTI (lower urinary tract infection)     Objective:  Physical Exam: Vascular: DP/PT pulses 2/4 bilateral. CFT <3 seconds. Normal hair growth on digits. No edema.  Skin. No lacerations or abrasions bilateral feet. Hyperkeratotic tissue noted sub 3rd metatarsal with underlying wound  healed. Hyperkeratotic tissue on right noted as well under third metatrsal Musculoskeletal: MMT 5/5 bilateral lower extremities in DF, PF, Inversion and Eversion. Deceased ROM in DF of ankle joint.  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  Neurological: Sensation intact to light touch.   Assessment:   1. Pre-ulcerative calluses   2. Rheumatoid arthritis of left foot, unspecified whether rheumatoid factor present (HCC)   3. Healed ulcer of left foot             Plan:  Patient was evaluated and treated and all questions answered. Ulcer left plantar foot healed.  -Debridement of hyperkeratotic tissue bilateral third metatarsals without incident using chisel.  -Orthotics doing well but causing soreness  every now and then.   -Answered all patient questions -Patient to return  in 4 weeks for at risk foot care -Patient advised to call the office if any problems or questions arise in the meantime.   Return in about 4 weeks (around 07/15/2023) for rfc.    Return in about 4 weeks (around 07/15/2023) for rfc.    Return in about 4 weeks (around 07/15/2023) for rfc.   [name], DPM

## 2023-07-09 DIAGNOSIS — L249 Irritant contact dermatitis, unspecified cause: Secondary | ICD-10-CM | POA: Diagnosis not present

## 2023-07-15 ENCOUNTER — Encounter: Payer: Self-pay | Admitting: Podiatry

## 2023-07-15 ENCOUNTER — Ambulatory Visit (INDEPENDENT_AMBULATORY_CARE_PROVIDER_SITE_OTHER): Payer: Medicare Other | Admitting: Podiatry

## 2023-07-15 DIAGNOSIS — Z872 Personal history of diseases of the skin and subcutaneous tissue: Secondary | ICD-10-CM | POA: Diagnosis not present

## 2023-07-15 DIAGNOSIS — L84 Corns and callosities: Secondary | ICD-10-CM

## 2023-07-15 DIAGNOSIS — M216X9 Other acquired deformities of unspecified foot: Secondary | ICD-10-CM | POA: Diagnosis not present

## 2023-07-15 DIAGNOSIS — M069 Rheumatoid arthritis, unspecified: Secondary | ICD-10-CM

## 2023-07-15 NOTE — Progress Notes (Signed)
  Subjective:  Patient ID: [name], female    DOB: [birth year],   MRN: [id number]  Chief Complaint  Patient presents with   Callouses    89 y.o. female presents for follow-up of pre-ulcerative calluses Relates still doing well and no issues She has been wearing new brooks shoes which help. New orthotics doing well.    Denies any other pedal complaints. Denies n/v/f/c.   Past Medical History:  Diagnosis Date   Abnormal cardiovascular stress test 12/2013   s/p cath with mild nonobstructive CAD normal EF - managed medically   Breast cancer (HCC) 2008   RT, chemo, lumpectomy   Diverticulitis    DVT (deep vein thrombosis) in pregnancy    site of PICC catheter   HTN (hypertension)    Hyperlipidemia    Hypothyroidism    Kidney tumor 2010   RFA   Personal history of chemotherapy    Personal history of radiation therapy    Prediabetes    Rheumatoid arthritis(714.0)    UTI (lower urinary tract infection)     Objective:  Physical Exam: Vascular: DP/PT pulses 2/4 bilateral. CFT <3 seconds. Normal hair growth on digits. No edema.  Skin. No lacerations or abrasions bilateral feet. Hyperkeratotic tissue noted sub 3rd metatarsal with underlying wound  healed. Hyperkeratotic tissue on right noted as well under third metatrsal Musculoskeletal: MMT 5/5 bilateral lower extremities in DF, PF, Inversion and Eversion. Deceased ROM in DF of ankle joint.  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  Neurological: Sensation intact to light touch.   Assessment:   1. Pre-ulcerative calluses   2. Rheumatoid arthritis of left foot, unspecified whether rheumatoid factor present (HCC)   3. Healed ulcer of left foot   4. Plantar flexed metatarsal, unspecified laterality             Plan:  Patient was evaluated and treated and all questions answered. Ulcer left plantar foot healed.  -Debridement of hyperkeratotic tissue bilateral third metatarsals  without incident using chisel.  -Orthotics doing well but causing soreness every now and then.  Would like to have them evaluated by Bethann Berkshire to see if they can be modified or if we need to send back for modification.  -Answered all patient questions -Patient to return  in 4 weeks for at risk foot care -Patient advised to call the office if any problems or questions arise in the meantime.   Return in about 4 weeks (around 08/12/2023) for rfc.    Return in about 4 weeks (around 08/12/2023) for rfc.    Return in about 4 weeks (around 08/12/2023) for rfc.   [name], DPM

## 2023-07-21 DIAGNOSIS — M810 Age-related osteoporosis without current pathological fracture: Secondary | ICD-10-CM | POA: Diagnosis not present

## 2023-07-28 ENCOUNTER — Other Ambulatory Visit: Payer: Medicare Other

## 2023-08-03 DIAGNOSIS — Z23 Encounter for immunization: Secondary | ICD-10-CM | POA: Diagnosis not present

## 2023-08-05 ENCOUNTER — Ambulatory Visit: Payer: Medicare Other

## 2023-08-05 NOTE — Progress Notes (Signed)
Patient was in with daughter thinned out current orthotics as she feels they are too thick and croiwding feet inside shoe  Also left orthotic needed an offload at 2nd MTFJ to greater reduce pressure  Patient was very happy with changes will try and will call if any other problems arise  Addison Bailey Cped, CFo, CFm

## 2023-08-12 ENCOUNTER — Encounter: Payer: Self-pay | Admitting: Podiatry

## 2023-08-12 ENCOUNTER — Ambulatory Visit (INDEPENDENT_AMBULATORY_CARE_PROVIDER_SITE_OTHER): Payer: Medicare Other | Admitting: Podiatry

## 2023-08-12 VITALS — Ht 61.0 in | Wt 134.0 lb

## 2023-08-12 DIAGNOSIS — M069 Rheumatoid arthritis, unspecified: Secondary | ICD-10-CM

## 2023-08-12 DIAGNOSIS — Z872 Personal history of diseases of the skin and subcutaneous tissue: Secondary | ICD-10-CM | POA: Diagnosis not present

## 2023-08-12 DIAGNOSIS — Z23 Encounter for immunization: Secondary | ICD-10-CM | POA: Diagnosis not present

## 2023-08-12 DIAGNOSIS — L84 Corns and callosities: Secondary | ICD-10-CM | POA: Diagnosis not present

## 2023-08-12 NOTE — Progress Notes (Signed)
  Subjective:  Patient ID: [name], female    DOB: 01-Jul-1934,   MRN: [id number]  Chief Complaint  Patient presents with   Wound Check    F/U Callous on left foot.    89 y.o. female presents for follow-up of pre-ulcerative calluses Relates still doing well and no issues She has been wearing new brooks shoes which help. New orthotics doing well and has recently had them adjusted.    Denies any other pedal complaints. Denies n/v/f/c.   Past Medical History:  Diagnosis Date   Abnormal cardiovascular stress test 12/2013   s/p cath with mild nonobstructive CAD normal EF - managed medically   Breast cancer (HCC) 2008   RT, chemo, lumpectomy   Diverticulitis    DVT (deep vein thrombosis) in pregnancy    site of PICC catheter   HTN (hypertension)    Hyperlipidemia    Hypothyroidism    Kidney tumor 2010   RFA   Personal history of chemotherapy    Personal history of radiation therapy    Prediabetes    Rheumatoid arthritis(714.0)    UTI (lower urinary tract infection)     Objective:  Physical Exam: Vascular: DP/PT pulses 2/4 bilateral. CFT <3 seconds. Normal hair growth on digits. No edema.  Skin. No lacerations or abrasions bilateral feet. Hyperkeratotic tissue noted sub 3rd metatarsal with underlying wound  healed. Hyperkeratotic tissue on right noted as well under third metatrsal Musculoskeletal: MMT 5/5 bilateral lower extremities in DF, PF, Inversion and Eversion. Deceased ROM in DF of ankle joint.  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  Neurological: Sensation intact to light touch.   Assessment:   1. Pre-ulcerative calluses   2. Rheumatoid arthritis of left foot, unspecified whether rheumatoid factor present (HCC)   3. History of foot ulcer              Plan:  Patient was evaluated and treated and all questions answered. Ulcer left plantar foot healed.  -Debridement of hyperkeratotic tissue bilateral third  metatarsals without incident using chisel.  -Orthotics doing well.  -Answered all patient questions -Patient to return  in 4 weeks for at risk foot care -Patient advised to call the office if any problems or questions arise in the meantime.   No follow-ups on file.    No follow-ups on file.    No follow-ups on file.   [name], DPM

## 2023-09-07 ENCOUNTER — Ambulatory Visit (INDEPENDENT_AMBULATORY_CARE_PROVIDER_SITE_OTHER): Payer: Medicare Other | Admitting: Podiatry

## 2023-09-07 DIAGNOSIS — Z872 Personal history of diseases of the skin and subcutaneous tissue: Secondary | ICD-10-CM | POA: Diagnosis not present

## 2023-09-07 DIAGNOSIS — M069 Rheumatoid arthritis, unspecified: Secondary | ICD-10-CM | POA: Diagnosis not present

## 2023-09-07 DIAGNOSIS — L84 Corns and callosities: Secondary | ICD-10-CM | POA: Diagnosis not present

## 2023-09-07 DIAGNOSIS — M216X9 Other acquired deformities of unspecified foot: Secondary | ICD-10-CM

## 2023-09-07 NOTE — Progress Notes (Signed)
  Subjective:  Patient ID: [name], female    DOB: 1934-08-27,   MRN: [id number]  No chief complaint on file.   89 y.o. female presents for follow-up of pre-ulcerative calluses Relates still doing well and no issues Orthotics doing well.     Denies any other pedal complaints. Denies n/v/f/c.   Past Medical History:  Diagnosis Date   Abnormal cardiovascular stress test 12/2013   s/p cath with mild nonobstructive CAD normal EF - managed medically   Breast cancer (HCC) 2008   RT, chemo, lumpectomy   Diverticulitis    DVT (deep vein thrombosis) in pregnancy    site of PICC catheter   HTN (hypertension)    Hyperlipidemia    Hypothyroidism    Kidney tumor 2010   RFA   Personal history of chemotherapy    Personal history of radiation therapy    Prediabetes    Rheumatoid arthritis(714.0)    UTI (lower urinary tract infection)     Objective:  Physical Exam: Vascular: DP/PT pulses 2/4 bilateral. CFT <3 seconds. Normal hair growth on digits. No edema.  Skin. No lacerations or abrasions bilateral feet. Hyperkeratotic tissue noted sub 3rd metatarsal with underlying wound  healed. Hyperkeratotic tissue on right noted as well under third metatrsal Musculoskeletal: MMT 5/5 bilateral lower extremities in DF, PF, Inversion and Eversion. Deceased ROM in DF of ankle joint.  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  Neurological: Sensation intact to light touch.   Assessment:   1. Pre-ulcerative calluses   2. Rheumatoid arthritis of left foot, unspecified whether rheumatoid factor present (HCC)   3. History of foot ulcer   4. Plantar flexed metatarsal, unspecified laterality               Plan:  Patient was evaluated and treated and all questions answered. Ulcer left plantar foot healed.  -Debridement of hyperkeratotic tissue bilateral third metatarsals without incident using chisel.  -Orthotics doing well.  -Answered all patient  questions -Patient to return  in 4 weeks for at risk foot care -Patient advised to call the office if any problems or questions arise in the meantime.   Return in about 4 weeks (around 10/05/2023) for rfc.    Return in about 4 weeks (around 10/05/2023) for rfc.    Return in about 4 weeks (around 10/05/2023) for rfc.   [name], DPM

## 2023-09-16 DIAGNOSIS — M0579 Rheumatoid arthritis with rheumatoid factor of multiple sites without organ or systems involvement: Secondary | ICD-10-CM | POA: Diagnosis not present

## 2023-10-12 ENCOUNTER — Encounter: Payer: Self-pay | Admitting: Podiatry

## 2023-10-12 ENCOUNTER — Ambulatory Visit (INDEPENDENT_AMBULATORY_CARE_PROVIDER_SITE_OTHER): Payer: Medicare Other | Admitting: Podiatry

## 2023-10-12 DIAGNOSIS — M069 Rheumatoid arthritis, unspecified: Secondary | ICD-10-CM

## 2023-10-12 DIAGNOSIS — L84 Corns and callosities: Secondary | ICD-10-CM

## 2023-10-12 DIAGNOSIS — Z872 Personal history of diseases of the skin and subcutaneous tissue: Secondary | ICD-10-CM | POA: Diagnosis not present

## 2023-10-12 NOTE — Progress Notes (Signed)
  Subjective:  Patient ID: [name], female    DOB: [birth year],   MRN: [id number]  No chief complaint on file.   89 y.o. female presents for follow-up of pre-ulcerative calluses Relates still doing well and no issues Orthotics doing well.     Denies any other pedal complaints. Denies n/v/f/c.   Past Medical History:  Diagnosis Date   Abnormal cardiovascular stress test 12/2013   s/p cath with mild nonobstructive CAD normal EF - managed medically   Breast cancer (HCC) 2008   RT, chemo, lumpectomy   Diverticulitis    DVT (deep vein thrombosis) in pregnancy    site of PICC catheter   HTN (hypertension)    Hyperlipidemia    Hypothyroidism    Kidney tumor 2010   RFA   Personal history of chemotherapy    Personal history of radiation therapy    Prediabetes    Rheumatoid arthritis(714.0)    UTI (lower urinary tract infection)     Objective:  Physical Exam: Vascular: DP/PT pulses 2/4 bilateral. CFT <3 seconds. Normal hair growth on digits. No edema.  Skin. No lacerations or abrasions bilateral feet. Hyperkeratotic tissue noted sub 3rd metatarsal with underlying wound  healed. Hyperkeratotic tissue on right noted as well under third metatrsal Musculoskeletal: MMT 5/5 bilateral lower extremities in DF, PF, Inversion and Eversion. Deceased ROM in DF of ankle joint.  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  Neurological: Sensation intact to light touch.   Assessment:   1. Pre-ulcerative calluses   2. Rheumatoid arthritis of left foot, unspecified whether rheumatoid factor present (HCC)   3. History of foot ulcer               Plan:  Patient was evaluated and treated and all questions answered. Ulcer left plantar foot healed.  -Debridement of hyperkeratotic tissue bilateral third metatarsals without incident using chisel.  -Orthotics doing well.  -Answered all patient questions -Patient to return  in 4 weeks for at risk foot  care -Patient advised to call the office if any problems or questions arise in the meantime.   No follow-ups on file.    No follow-ups on file.    No follow-ups on file.   [name], DPM

## 2023-10-21 DIAGNOSIS — M5451 Vertebrogenic low back pain: Secondary | ICD-10-CM | POA: Diagnosis not present

## 2023-10-21 DIAGNOSIS — R109 Unspecified abdominal pain: Secondary | ICD-10-CM | POA: Diagnosis not present

## 2023-10-22 DIAGNOSIS — E673 Hypervitaminosis D: Secondary | ICD-10-CM | POA: Diagnosis not present

## 2023-10-22 DIAGNOSIS — R7303 Prediabetes: Secondary | ICD-10-CM | POA: Diagnosis not present

## 2023-10-22 DIAGNOSIS — M810 Age-related osteoporosis without current pathological fracture: Secondary | ICD-10-CM | POA: Diagnosis not present

## 2023-10-22 DIAGNOSIS — E039 Hypothyroidism, unspecified: Secondary | ICD-10-CM | POA: Diagnosis not present

## 2023-10-22 DIAGNOSIS — I10 Essential (primary) hypertension: Secondary | ICD-10-CM | POA: Diagnosis not present

## 2023-10-22 DIAGNOSIS — K219 Gastro-esophageal reflux disease without esophagitis: Secondary | ICD-10-CM | POA: Diagnosis not present

## 2023-10-30 DIAGNOSIS — I10 Essential (primary) hypertension: Secondary | ICD-10-CM | POA: Diagnosis not present

## 2023-10-30 DIAGNOSIS — Z79899 Other long term (current) drug therapy: Secondary | ICD-10-CM | POA: Diagnosis not present

## 2023-10-30 DIAGNOSIS — M810 Age-related osteoporosis without current pathological fracture: Secondary | ICD-10-CM | POA: Diagnosis not present

## 2023-10-30 DIAGNOSIS — R7303 Prediabetes: Secondary | ICD-10-CM | POA: Diagnosis not present

## 2023-10-30 DIAGNOSIS — M0579 Rheumatoid arthritis with rheumatoid factor of multiple sites without organ or systems involvement: Secondary | ICD-10-CM | POA: Diagnosis not present

## 2023-10-30 DIAGNOSIS — E039 Hypothyroidism, unspecified: Secondary | ICD-10-CM | POA: Diagnosis not present

## 2023-10-30 DIAGNOSIS — Z0000 Encounter for general adult medical examination without abnormal findings: Secondary | ICD-10-CM | POA: Diagnosis not present

## 2023-10-30 DIAGNOSIS — Z8673 Personal history of transient ischemic attack (TIA), and cerebral infarction without residual deficits: Secondary | ICD-10-CM | POA: Diagnosis not present

## 2023-11-11 DIAGNOSIS — M5451 Vertebrogenic low back pain: Secondary | ICD-10-CM | POA: Diagnosis not present

## 2023-11-11 DIAGNOSIS — R109 Unspecified abdominal pain: Secondary | ICD-10-CM | POA: Diagnosis not present

## 2023-11-25 DIAGNOSIS — R109 Unspecified abdominal pain: Secondary | ICD-10-CM | POA: Diagnosis not present

## 2023-11-25 DIAGNOSIS — M5451 Vertebrogenic low back pain: Secondary | ICD-10-CM | POA: Diagnosis not present

## 2023-11-27 DIAGNOSIS — M51369 Other intervertebral disc degeneration, lumbar region without mention of lumbar back pain or lower extremity pain: Secondary | ICD-10-CM | POA: Diagnosis not present

## 2023-11-27 DIAGNOSIS — M5451 Vertebrogenic low back pain: Secondary | ICD-10-CM | POA: Diagnosis not present

## 2023-12-01 DIAGNOSIS — Z6823 Body mass index (BMI) 23.0-23.9, adult: Secondary | ICD-10-CM | POA: Diagnosis not present

## 2023-12-01 DIAGNOSIS — M1991 Primary osteoarthritis, unspecified site: Secondary | ICD-10-CM | POA: Diagnosis not present

## 2023-12-01 DIAGNOSIS — Z79899 Other long term (current) drug therapy: Secondary | ICD-10-CM | POA: Diagnosis not present

## 2023-12-01 DIAGNOSIS — M0579 Rheumatoid arthritis with rheumatoid factor of multiple sites without organ or systems involvement: Secondary | ICD-10-CM | POA: Diagnosis not present

## 2023-12-09 ENCOUNTER — Ambulatory Visit (INDEPENDENT_AMBULATORY_CARE_PROVIDER_SITE_OTHER): Payer: Medicare Other | Admitting: Podiatry

## 2023-12-09 ENCOUNTER — Encounter: Payer: Self-pay | Admitting: Podiatry

## 2023-12-09 VITALS — Ht 61.0 in | Wt 134.0 lb

## 2023-12-09 DIAGNOSIS — M79671 Pain in right foot: Secondary | ICD-10-CM

## 2023-12-09 DIAGNOSIS — M05771 Rheumatoid arthritis with rheumatoid factor of right ankle and foot without organ or systems involvement: Secondary | ICD-10-CM | POA: Diagnosis not present

## 2023-12-09 DIAGNOSIS — M05772 Rheumatoid arthritis with rheumatoid factor of left ankle and foot without organ or systems involvement: Secondary | ICD-10-CM | POA: Diagnosis not present

## 2023-12-09 DIAGNOSIS — Z872 Personal history of diseases of the skin and subcutaneous tissue: Secondary | ICD-10-CM

## 2023-12-09 DIAGNOSIS — M79672 Pain in left foot: Secondary | ICD-10-CM | POA: Diagnosis not present

## 2023-12-09 DIAGNOSIS — M216X9 Other acquired deformities of unspecified foot: Secondary | ICD-10-CM

## 2023-12-11 DIAGNOSIS — Z85828 Personal history of other malignant neoplasm of skin: Secondary | ICD-10-CM | POA: Diagnosis not present

## 2023-12-11 DIAGNOSIS — L578 Other skin changes due to chronic exposure to nonionizing radiation: Secondary | ICD-10-CM | POA: Diagnosis not present

## 2023-12-11 DIAGNOSIS — L821 Other seborrheic keratosis: Secondary | ICD-10-CM | POA: Diagnosis not present

## 2023-12-11 DIAGNOSIS — L570 Actinic keratosis: Secondary | ICD-10-CM | POA: Diagnosis not present

## 2023-12-11 DIAGNOSIS — M310 Hypersensitivity angiitis: Secondary | ICD-10-CM | POA: Diagnosis not present

## 2023-12-11 DIAGNOSIS — L718 Other rosacea: Secondary | ICD-10-CM | POA: Diagnosis not present

## 2023-12-11 NOTE — Progress Notes (Signed)
 Subjective:  Patient ID: [name], female    DOB: Oct 18, 1933,  MRN: [id number]  [name] presents to clinic today for at risk foot care with h/o RA on immunosuppressive therapy and preulcerative lesion(s) b/l lower extremities left >right. Pain prevent(s) comfortable ambulation. Aggravating factor is weightbearing with and without shoegear. Symptoms relieved with periodic professional debridement. She has custom orthotics and wears them daily. Chief Complaint  Patient presents with   RFC    She is here for her callous trim today, PCP is dr, Paulino Rily and last seen a few months ago,    New problem(s): None.   PCP is Mila Palmer, MD.  Allergies  Allergen Reactions   Dilaudid [Hydromorphone Hcl] Nausea And Vomiting   Hydromorphone Hcl Other (See Comments)   Alendronate Other (See Comments)    Other reaction(s): Severe heartburn Other reaction(s): Severe heartburn   Alendronate Sodium     Other reaction(s): Severe heartburn   Azithromycin     Other reaction(s): made her feel bad Other reaction(s): made her feel bad   Gabapentin Other (See Comments)    Other reaction(s): Unknown   Hydromorphone     Other reaction(s): severe N V Other reaction(s): severe N V   Morphine And Codeine    Morphine Sulfate Other (See Comments)    Other reaction(s): vomitting   Paclitaxel Other (See Comments)    rash  Other reaction(s): rash   Parathyroid Hormone (Recomb)     Other reaction(s): (radiation therapy for breast cancer)   Promethazine Other (See Comments)    Other reaction(s): hallucinations   Promethazine Hcl     Other reaction(s): hallucinations   Sulfasalazine Other (See Comments)    Other reaction(s): Unknown   Tamiflu [Oseltamivir Phosphate]     rash   Tetanus Immune Globulin    Tetanus Toxoid Swelling and Other (See Comments)    Other reaction(s): localized reaction   Tetanus Toxoid, Adsorbed     Other reaction(s): localized reaction Other reaction(s): localized  reaction   Tetanus Toxoids    Tramadol Other (See Comments)   Oseltamivir Rash and Other (See Comments)    Other reaction(s): Rash    Review of Systems: Negative except as noted in the HPI.  Objective: No changes noted in today's physical examination. There were no vitals filed for this visit. [name] is a pleasant 89 y.o. female WD, WN in NAD. AAO x 3.  Neurovascular Examination: Neurovascular status unchanged: Pedal sensation intact b/l. CFT immediate b/l LE. Palpable DP/PT pulses b/l LE. Digital hair sparse b/l. Skin temperature gradient WNL b/l. No pain with calf compression b/l. No edema noted b/l. No cyanosis or clubbing noted b/l LE.  Dermatological Examination: Pedal skin is warm and supple b/l LE. No open wounds b/l LE. No interdigital macerations noted b/l LE.   Toenails are of adequate length.  Hyperkeratotic lesion(s) submet head 3 b/l.  No erythema, no edema, no drainage, no fluctuance.  Musculoskeletal Examination: Muscle strength 5/5 to all lower extremity muscle groups bilaterally. Severely plantarflexed metatarsals 1-5 b/l. Severe hammertoe deformity noted 1-5 bilaterally.   Patient ambulates independent of any assistive aids. Wearing appropriate fitting shoe gear.  Assessment/Plan: 1. Plantar flexed metatarsal, unspecified laterality   2. Pain in both feet   3. History of foot ulcer   4. Rheumatoid arthritis involving both feet with positive rheumatoid factor (HCC)     -Patient was evaluated today. All questions/concerns addressed on today's visit. -Continue orthotics daily. -Continue supportive shoe gear daily. -  As a courtesy, callus(es) submet head 3 b/l pared utilizing sterile scalpel blade without complication or incident. Total number pared=2. -Patient scheduled to see Dr. Ralene Cork in one month for follow up of bilateral foot pain. -Patient/POA to call should there be question/concern in the interim.   Return in about 1 month (around  01/06/2024).  Freddie Breech, DPM      [place] LOCATION: [address] [place] [address]                   Office [phone]   [facility] LOCATION: [address] [place], [place] [address] Office [phone]

## 2023-12-15 DIAGNOSIS — E039 Hypothyroidism, unspecified: Secondary | ICD-10-CM | POA: Diagnosis not present

## 2024-01-01 DIAGNOSIS — B029 Zoster without complications: Secondary | ICD-10-CM | POA: Diagnosis not present

## 2024-01-01 DIAGNOSIS — Z85828 Personal history of other malignant neoplasm of skin: Secondary | ICD-10-CM | POA: Diagnosis not present

## 2024-01-05 ENCOUNTER — Other Ambulatory Visit: Payer: Self-pay | Admitting: Family Medicine

## 2024-01-05 ENCOUNTER — Encounter: Payer: Self-pay | Admitting: Family Medicine

## 2024-01-05 DIAGNOSIS — Z1231 Encounter for screening mammogram for malignant neoplasm of breast: Secondary | ICD-10-CM

## 2024-01-05 DIAGNOSIS — Z0000 Encounter for general adult medical examination without abnormal findings: Secondary | ICD-10-CM

## 2024-01-11 ENCOUNTER — Ambulatory Visit: Payer: Medicare Other | Admitting: Podiatry

## 2024-01-12 ENCOUNTER — Ambulatory Visit: Payer: Medicare Other | Admitting: Podiatry

## 2024-01-21 DIAGNOSIS — M810 Age-related osteoporosis without current pathological fracture: Secondary | ICD-10-CM | POA: Diagnosis not present

## 2024-01-25 DIAGNOSIS — M48061 Spinal stenosis, lumbar region without neurogenic claudication: Secondary | ICD-10-CM | POA: Diagnosis not present

## 2024-01-25 DIAGNOSIS — F331 Major depressive disorder, recurrent, moderate: Secondary | ICD-10-CM | POA: Diagnosis not present

## 2024-01-25 DIAGNOSIS — G8929 Other chronic pain: Secondary | ICD-10-CM | POA: Diagnosis not present

## 2024-02-01 ENCOUNTER — Ambulatory Visit
Admission: RE | Admit: 2024-02-01 | Discharge: 2024-02-01 | Disposition: A | Discharge status: Home / Self Care | Source: Ambulatory Visit | Attending: Family Medicine | Admitting: Family Medicine

## 2024-02-01 DIAGNOSIS — Z1231 Encounter for screening mammogram for malignant neoplasm of breast: Secondary | ICD-10-CM

## 2024-02-02 ENCOUNTER — Ambulatory Visit (INDEPENDENT_AMBULATORY_CARE_PROVIDER_SITE_OTHER): Admitting: Podiatry

## 2024-02-02 DIAGNOSIS — L84 Corns and callosities: Secondary | ICD-10-CM

## 2024-02-02 DIAGNOSIS — M05771 Rheumatoid arthritis with rheumatoid factor of right ankle and foot without organ or systems involvement: Secondary | ICD-10-CM

## 2024-02-02 DIAGNOSIS — M05772 Rheumatoid arthritis with rheumatoid factor of left ankle and foot without organ or systems involvement: Secondary | ICD-10-CM

## 2024-02-02 DIAGNOSIS — M216X9 Other acquired deformities of unspecified foot: Secondary | ICD-10-CM

## 2024-02-02 NOTE — Progress Notes (Signed)
  Subjective:  Patient ID: [name], female    DOB: 08-Mar-1934,   MRN: [id number]  No chief complaint on file.   89 y.o. female presents for follow-up of pre-ulcerative calluses Relates still doing well and no issues Orthotics doing well.     Denies any other pedal complaints. Denies n/v/f/c.   Past Medical History:  Diagnosis Date   Abnormal cardiovascular stress test 12/2013   s/p cath with mild nonobstructive CAD normal EF - managed medically   Breast cancer (HCC) 2008   LEFT, chemo, lumpectomy   Diverticulitis    DVT (deep vein thrombosis) in pregnancy    site of PICC catheter   HTN (hypertension)    Hyperlipidemia    Hypothyroidism    Kidney tumor 2010   RFA   Personal history of chemotherapy    Personal history of radiation therapy    Prediabetes    Rheumatoid arthritis(714.0)    UTI (lower urinary tract infection)     Objective:  Physical Exam: Vascular: DP/PT pulses 2/4 bilateral. CFT <3 seconds. Normal hair growth on digits. No edema.  Skin. No lacerations or abrasions bilateral feet. Hyperkeratotic tissue noted sub 3rd metatarsal with underlying wound  healed. Hyperkeratotic tissue on right noted as well under third metatrsal Musculoskeletal: MMT 5/5 bilateral lower extremities in DF, PF, Inversion and Eversion. Deceased ROM in DF of ankle joint.  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  Neurological: Sensation intact to light touch.   Assessment:   1. Pre-ulcerative calluses   2. Plantar flexed metatarsal, unspecified laterality   3. Rheumatoid arthritis involving both feet with positive rheumatoid factor (HCC)                Plan:  Patient was evaluated and treated and all questions answered. Ulcer left plantar foot healed.  -Debridement of hyperkeratotic tissue bilateral third metatarsals without incident using chisel.  -Orthotics doing well.  -Answered all patient questions -Patient to return  in  8 weeks for at risk foot care -Patient advised to call the office if any problems or questions arise in the meantime.   Return in about 8 weeks (around 03/29/2024) for rfc.    Return in about 8 weeks (around 03/29/2024) for rfc.    Return in about 8 weeks (around 03/29/2024) for rfc.   Jennefer Moats, DPM

## 2024-02-05 ENCOUNTER — Other Ambulatory Visit: Payer: Self-pay | Admitting: Family Medicine

## 2024-02-05 DIAGNOSIS — R928 Other abnormal and inconclusive findings on diagnostic imaging of breast: Secondary | ICD-10-CM

## 2024-02-17 DIAGNOSIS — M419 Scoliosis, unspecified: Secondary | ICD-10-CM | POA: Diagnosis not present

## 2024-02-17 DIAGNOSIS — M48062 Spinal stenosis, lumbar region with neurogenic claudication: Secondary | ICD-10-CM | POA: Diagnosis not present

## 2024-02-17 DIAGNOSIS — M5416 Radiculopathy, lumbar region: Secondary | ICD-10-CM | POA: Diagnosis not present

## 2024-02-20 ENCOUNTER — Encounter (HOSPITAL_COMMUNITY): Payer: Self-pay

## 2024-02-26 ENCOUNTER — Encounter: Payer: Self-pay | Admitting: Family Medicine

## 2024-02-27 ENCOUNTER — Ambulatory Visit
Admission: RE | Admit: 2024-02-27 | Discharge: 2024-02-27 | Disposition: A | Discharge status: Home / Self Care | Source: Ambulatory Visit | Attending: Family Medicine | Admitting: Family Medicine

## 2024-02-27 DIAGNOSIS — R920 Mammographic microcalcification found on diagnostic imaging of breast: Secondary | ICD-10-CM | POA: Diagnosis not present

## 2024-02-27 DIAGNOSIS — Z853 Personal history of malignant neoplasm of breast: Secondary | ICD-10-CM | POA: Diagnosis not present

## 2024-02-27 DIAGNOSIS — R928 Other abnormal and inconclusive findings on diagnostic imaging of breast: Secondary | ICD-10-CM

## 2024-02-29 ENCOUNTER — Other Ambulatory Visit: Payer: Self-pay | Admitting: Family Medicine

## 2024-02-29 DIAGNOSIS — R921 Mammographic calcification found on diagnostic imaging of breast: Secondary | ICD-10-CM

## 2024-03-01 DIAGNOSIS — M0579 Rheumatoid arthritis with rheumatoid factor of multiple sites without organ or systems involvement: Secondary | ICD-10-CM | POA: Diagnosis not present

## 2024-03-05 DIAGNOSIS — M47816 Spondylosis without myelopathy or radiculopathy, lumbar region: Secondary | ICD-10-CM | POA: Diagnosis not present

## 2024-03-17 DIAGNOSIS — M48062 Spinal stenosis, lumbar region with neurogenic claudication: Secondary | ICD-10-CM | POA: Diagnosis not present

## 2024-03-17 DIAGNOSIS — M5416 Radiculopathy, lumbar region: Secondary | ICD-10-CM | POA: Diagnosis not present

## 2024-03-17 DIAGNOSIS — M5116 Intervertebral disc disorders with radiculopathy, lumbar region: Secondary | ICD-10-CM | POA: Diagnosis not present

## 2024-03-29 ENCOUNTER — Ambulatory Visit (INDEPENDENT_AMBULATORY_CARE_PROVIDER_SITE_OTHER): Admitting: Podiatry

## 2024-03-29 ENCOUNTER — Encounter: Payer: Self-pay | Admitting: Podiatry

## 2024-03-29 DIAGNOSIS — M05771 Rheumatoid arthritis with rheumatoid factor of right ankle and foot without organ or systems involvement: Secondary | ICD-10-CM | POA: Diagnosis not present

## 2024-03-29 DIAGNOSIS — M216X9 Other acquired deformities of unspecified foot: Secondary | ICD-10-CM

## 2024-03-29 DIAGNOSIS — M05772 Rheumatoid arthritis with rheumatoid factor of left ankle and foot without organ or systems involvement: Secondary | ICD-10-CM | POA: Diagnosis not present

## 2024-03-29 DIAGNOSIS — L84 Corns and callosities: Secondary | ICD-10-CM

## 2024-03-29 NOTE — Progress Notes (Signed)
  Subjective:  Patient ID: [name], female    DOB: 05/14/1934,   MRN: [id number]  No chief complaint on file.   89 y.o. female presents for follow-up of pre-ulcerative calluses Relates still doing well and no issues Orthotics doing well.     Denies any other pedal complaints. Denies n/v/f/c.   Past Medical History:  Diagnosis Date   Abnormal cardiovascular stress test 12/2013   s/p cath with mild nonobstructive CAD normal EF - managed medically   Breast cancer (HCC) 2008   LEFT, chemo, lumpectomy   Diverticulitis    DVT (deep vein thrombosis) in pregnancy    site of PICC catheter   HTN (hypertension)    Hyperlipidemia    Hypothyroidism    Kidney tumor 2010   RFA   Personal history of chemotherapy    Personal history of radiation therapy    Prediabetes    Rheumatoid arthritis(714.0)    UTI (lower urinary tract infection)     Objective:  Physical Exam: Vascular: DP/PT pulses 2/4 bilateral. CFT <3 seconds. Normal hair growth on digits. No edema.  Skin. No lacerations or abrasions bilateral feet. Hyperkeratotic tissue noted sub 3rd metatarsal with underlying wound  healed. Hyperkeratotic tissue on right noted as well under third metatrsal Musculoskeletal: MMT 5/5 bilateral lower extremities in DF, PF, Inversion and Eversion. Deceased ROM in DF of ankle joint.  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  Neurological: Sensation intact to light touch.   Assessment:   1. Pre-ulcerative calluses   2. Plantar flexed metatarsal, unspecified laterality   3. Rheumatoid arthritis involving both feet with positive rheumatoid factor (HCC)                Plan:  Patient was evaluated and treated and all questions answered. Ulcer left plantar foot healed.  -Debridement of hyperkeratotic tissue bilateral third metatarsals without incident using chisel.  -Orthotics doing well.  -Answered all patient questions -Patient to return  in  8 weeks for at risk foot care -Patient advised to call the office if any problems or questions arise in the meantime.   No follow-ups on file.    No follow-ups on file.    No follow-ups on file.   Jennefer Moats, DPM

## 2024-04-11 DIAGNOSIS — M5416 Radiculopathy, lumbar region: Secondary | ICD-10-CM | POA: Diagnosis not present

## 2024-04-22 DIAGNOSIS — S60466A Insect bite (nonvenomous) of right little finger, initial encounter: Secondary | ICD-10-CM | POA: Diagnosis not present

## 2024-04-22 DIAGNOSIS — W57XXXA Bitten or stung by nonvenomous insect and other nonvenomous arthropods, initial encounter: Secondary | ICD-10-CM | POA: Diagnosis not present

## 2024-04-26 DIAGNOSIS — M48062 Spinal stenosis, lumbar region with neurogenic claudication: Secondary | ICD-10-CM | POA: Diagnosis not present

## 2024-04-28 DIAGNOSIS — G8929 Other chronic pain: Secondary | ICD-10-CM | POA: Diagnosis not present

## 2024-04-28 DIAGNOSIS — F331 Major depressive disorder, recurrent, moderate: Secondary | ICD-10-CM | POA: Diagnosis not present

## 2024-05-16 DIAGNOSIS — H04123 Dry eye syndrome of bilateral lacrimal glands: Secondary | ICD-10-CM | POA: Diagnosis not present

## 2024-05-16 DIAGNOSIS — H26493 Other secondary cataract, bilateral: Secondary | ICD-10-CM | POA: Diagnosis not present

## 2024-05-16 DIAGNOSIS — Z961 Presence of intraocular lens: Secondary | ICD-10-CM | POA: Diagnosis not present

## 2024-05-16 DIAGNOSIS — H52203 Unspecified astigmatism, bilateral: Secondary | ICD-10-CM | POA: Diagnosis not present

## 2024-05-16 DIAGNOSIS — H524 Presbyopia: Secondary | ICD-10-CM | POA: Diagnosis not present

## 2024-05-26 DIAGNOSIS — M48062 Spinal stenosis, lumbar region with neurogenic claudication: Secondary | ICD-10-CM | POA: Diagnosis not present

## 2024-05-26 DIAGNOSIS — M5416 Radiculopathy, lumbar region: Secondary | ICD-10-CM | POA: Diagnosis not present

## 2024-05-31 ENCOUNTER — Ambulatory Visit: Admitting: Podiatry

## 2024-05-31 DIAGNOSIS — Z6822 Body mass index (BMI) 22.0-22.9, adult: Secondary | ICD-10-CM | POA: Diagnosis not present

## 2024-05-31 DIAGNOSIS — Z79899 Other long term (current) drug therapy: Secondary | ICD-10-CM | POA: Diagnosis not present

## 2024-05-31 DIAGNOSIS — M1991 Primary osteoarthritis, unspecified site: Secondary | ICD-10-CM | POA: Diagnosis not present

## 2024-05-31 DIAGNOSIS — M0579 Rheumatoid arthritis with rheumatoid factor of multiple sites without organ or systems involvement: Secondary | ICD-10-CM | POA: Diagnosis not present

## 2024-06-01 ENCOUNTER — Ambulatory Visit: Admitting: Podiatry

## 2024-06-01 ENCOUNTER — Encounter: Payer: Self-pay | Admitting: Podiatry

## 2024-06-01 DIAGNOSIS — L84 Corns and callosities: Secondary | ICD-10-CM

## 2024-06-01 DIAGNOSIS — M216X9 Other acquired deformities of unspecified foot: Secondary | ICD-10-CM

## 2024-06-01 DIAGNOSIS — M05772 Rheumatoid arthritis with rheumatoid factor of left ankle and foot without organ or systems involvement: Secondary | ICD-10-CM

## 2024-06-01 DIAGNOSIS — M05771 Rheumatoid arthritis with rheumatoid factor of right ankle and foot without organ or systems involvement: Secondary | ICD-10-CM

## 2024-06-01 NOTE — Progress Notes (Signed)
  Subjective:  Patient ID: [name], female    DOB: 12/26/1933,   MRN: [id number]  Chief Complaint  Patient presents with   Callouses    I have Rheumatoid Arthritis, on my left foot I have a callus.  I have another one on my right foot.    89 y.o. female presents for follow-up of pre-ulcerative calluses Relates still doing well and no issues Orthotics doing well.     Denies any other pedal complaints. Denies n/v/f/c.   Past Medical History:  Diagnosis Date   Abnormal cardiovascular stress test 12/2013   s/p cath with mild nonobstructive CAD normal EF - managed medically   Breast cancer (HCC) 2008   LEFT, chemo, lumpectomy   Diverticulitis    DVT (deep vein thrombosis) in pregnancy    site of PICC catheter   HTN (hypertension)    Hyperlipidemia    Hypothyroidism    Kidney tumor 2010   RFA   Personal history of chemotherapy    Personal history of radiation therapy    Prediabetes    Rheumatoid arthritis(714.0)    UTI (lower urinary tract infection)     Objective:  Physical Exam: Vascular: DP/PT pulses 2/4 bilateral. CFT <3 seconds. Normal hair growth on digits. No edema.  Skin. No lacerations or abrasions bilateral feet. Hyperkeratotic tissue noted sub 3rd metatarsal with underlying wound  healed. Hyperkeratotic tissue on right noted as well under third metatrsal Musculoskeletal: MMT 5/5 bilateral lower extremities in DF, PF, Inversion and Eversion. Deceased ROM in DF of ankle joint.  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  Neurological: Sensation intact to light touch.   Assessment:   1. Pre-ulcerative calluses   2. Plantar flexed metatarsal, unspecified laterality   3. Rheumatoid arthritis involving both feet with positive rheumatoid factor (HCC)                 Plan:  Patient was evaluated and treated and all questions answered. Ulcer left plantar foot healed.  -Debridement of hyperkeratotic tissue  bilateral third metatarsals without incident using chisel.  -Orthotics doing well.  -Answered all patient questions -Patient to return  in 8 weeks for at risk foot care -Patient advised to call the office if any problems or questions arise in the meantime.   Return in about 8 weeks (around 07/27/2024) for rfc.    Return in about 8 weeks (around 07/27/2024) for rfc.    Return in about 8 weeks (around 07/27/2024) for rfc.   Asberry Failing, DPM

## 2024-06-20 DIAGNOSIS — I10 Essential (primary) hypertension: Secondary | ICD-10-CM | POA: Diagnosis not present

## 2024-06-20 DIAGNOSIS — R54 Age-related physical debility: Secondary | ICD-10-CM | POA: Diagnosis not present

## 2024-06-20 DIAGNOSIS — H8110 Benign paroxysmal vertigo, unspecified ear: Secondary | ICD-10-CM | POA: Diagnosis not present

## 2024-06-27 DIAGNOSIS — H9190 Unspecified hearing loss, unspecified ear: Secondary | ICD-10-CM | POA: Diagnosis not present

## 2024-06-27 DIAGNOSIS — E7800 Pure hypercholesterolemia, unspecified: Secondary | ICD-10-CM | POA: Diagnosis not present

## 2024-06-27 DIAGNOSIS — M069 Rheumatoid arthritis, unspecified: Secondary | ICD-10-CM | POA: Diagnosis not present

## 2024-06-27 DIAGNOSIS — F419 Anxiety disorder, unspecified: Secondary | ICD-10-CM | POA: Diagnosis not present

## 2024-06-27 DIAGNOSIS — H8112 Benign paroxysmal vertigo, left ear: Secondary | ICD-10-CM | POA: Diagnosis not present

## 2024-06-27 DIAGNOSIS — I10 Essential (primary) hypertension: Secondary | ICD-10-CM | POA: Diagnosis not present

## 2024-06-27 DIAGNOSIS — I2510 Atherosclerotic heart disease of native coronary artery without angina pectoris: Secondary | ICD-10-CM | POA: Diagnosis not present

## 2024-06-27 DIAGNOSIS — M48061 Spinal stenosis, lumbar region without neurogenic claudication: Secondary | ICD-10-CM | POA: Diagnosis not present

## 2024-06-27 DIAGNOSIS — M51369 Other intervertebral disc degeneration, lumbar region without mention of lumbar back pain or lower extremity pain: Secondary | ICD-10-CM | POA: Diagnosis not present

## 2024-06-27 DIAGNOSIS — M810 Age-related osteoporosis without current pathological fracture: Secondary | ICD-10-CM | POA: Diagnosis not present

## 2024-06-27 DIAGNOSIS — M419 Scoliosis, unspecified: Secondary | ICD-10-CM | POA: Diagnosis not present

## 2024-06-27 DIAGNOSIS — K219 Gastro-esophageal reflux disease without esophagitis: Secondary | ICD-10-CM | POA: Diagnosis not present

## 2024-07-01 DIAGNOSIS — H8112 Benign paroxysmal vertigo, left ear: Secondary | ICD-10-CM | POA: Diagnosis not present

## 2024-07-01 DIAGNOSIS — I10 Essential (primary) hypertension: Secondary | ICD-10-CM | POA: Diagnosis not present

## 2024-07-05 DIAGNOSIS — H8112 Benign paroxysmal vertigo, left ear: Secondary | ICD-10-CM | POA: Diagnosis not present

## 2024-07-05 DIAGNOSIS — I10 Essential (primary) hypertension: Secondary | ICD-10-CM | POA: Diagnosis not present

## 2024-07-07 DIAGNOSIS — H8112 Benign paroxysmal vertigo, left ear: Secondary | ICD-10-CM | POA: Diagnosis not present

## 2024-07-07 DIAGNOSIS — I10 Essential (primary) hypertension: Secondary | ICD-10-CM | POA: Diagnosis not present

## 2024-07-12 DIAGNOSIS — I10 Essential (primary) hypertension: Secondary | ICD-10-CM | POA: Diagnosis not present

## 2024-07-12 DIAGNOSIS — H8112 Benign paroxysmal vertigo, left ear: Secondary | ICD-10-CM | POA: Diagnosis not present

## 2024-07-14 DIAGNOSIS — I10 Essential (primary) hypertension: Secondary | ICD-10-CM | POA: Diagnosis not present

## 2024-07-14 DIAGNOSIS — H8112 Benign paroxysmal vertigo, left ear: Secondary | ICD-10-CM | POA: Diagnosis not present

## 2024-07-18 DIAGNOSIS — M48062 Spinal stenosis, lumbar region with neurogenic claudication: Secondary | ICD-10-CM | POA: Diagnosis not present

## 2024-07-21 DIAGNOSIS — H8112 Benign paroxysmal vertigo, left ear: Secondary | ICD-10-CM | POA: Diagnosis not present

## 2024-07-21 DIAGNOSIS — I10 Essential (primary) hypertension: Secondary | ICD-10-CM | POA: Diagnosis not present

## 2024-07-25 DIAGNOSIS — G8929 Other chronic pain: Secondary | ICD-10-CM | POA: Diagnosis not present

## 2024-07-25 DIAGNOSIS — Z23 Encounter for immunization: Secondary | ICD-10-CM | POA: Diagnosis not present

## 2024-07-25 DIAGNOSIS — G479 Sleep disorder, unspecified: Secondary | ICD-10-CM | POA: Diagnosis not present

## 2024-07-25 DIAGNOSIS — R413 Other amnesia: Secondary | ICD-10-CM | POA: Diagnosis not present

## 2024-07-26 DIAGNOSIS — M810 Age-related osteoporosis without current pathological fracture: Secondary | ICD-10-CM | POA: Diagnosis not present

## 2024-07-27 ENCOUNTER — Ambulatory Visit (INDEPENDENT_AMBULATORY_CARE_PROVIDER_SITE_OTHER): Admitting: Podiatry

## 2024-07-27 ENCOUNTER — Encounter: Payer: Self-pay | Admitting: Podiatry

## 2024-07-27 DIAGNOSIS — R7303 Prediabetes: Secondary | ICD-10-CM | POA: Diagnosis not present

## 2024-07-27 DIAGNOSIS — Z79631 Long term (current) use of antimetabolite agent: Secondary | ICD-10-CM | POA: Diagnosis not present

## 2024-07-27 DIAGNOSIS — H9190 Unspecified hearing loss, unspecified ear: Secondary | ICD-10-CM | POA: Diagnosis not present

## 2024-07-27 DIAGNOSIS — Z86718 Personal history of other venous thrombosis and embolism: Secondary | ICD-10-CM | POA: Diagnosis not present

## 2024-07-27 DIAGNOSIS — M48061 Spinal stenosis, lumbar region without neurogenic claudication: Secondary | ICD-10-CM | POA: Diagnosis not present

## 2024-07-27 DIAGNOSIS — I2510 Atherosclerotic heart disease of native coronary artery without angina pectoris: Secondary | ICD-10-CM | POA: Diagnosis not present

## 2024-07-27 DIAGNOSIS — M05772 Rheumatoid arthritis with rheumatoid factor of left ankle and foot without organ or systems involvement: Secondary | ICD-10-CM

## 2024-07-27 DIAGNOSIS — I10 Essential (primary) hypertension: Secondary | ICD-10-CM | POA: Diagnosis not present

## 2024-07-27 DIAGNOSIS — Z85528 Personal history of other malignant neoplasm of kidney: Secondary | ICD-10-CM | POA: Diagnosis not present

## 2024-07-27 DIAGNOSIS — E7800 Pure hypercholesterolemia, unspecified: Secondary | ICD-10-CM | POA: Diagnosis not present

## 2024-07-27 DIAGNOSIS — M05771 Rheumatoid arthritis with rheumatoid factor of right ankle and foot without organ or systems involvement: Secondary | ICD-10-CM

## 2024-07-27 DIAGNOSIS — H8112 Benign paroxysmal vertigo, left ear: Secondary | ICD-10-CM | POA: Diagnosis not present

## 2024-07-27 DIAGNOSIS — M51369 Other intervertebral disc degeneration, lumbar region without mention of lumbar back pain or lower extremity pain: Secondary | ICD-10-CM | POA: Diagnosis not present

## 2024-07-27 DIAGNOSIS — M216X9 Other acquired deformities of unspecified foot: Secondary | ICD-10-CM

## 2024-07-27 DIAGNOSIS — M810 Age-related osteoporosis without current pathological fracture: Secondary | ICD-10-CM | POA: Diagnosis not present

## 2024-07-27 DIAGNOSIS — Z9181 History of falling: Secondary | ICD-10-CM | POA: Diagnosis not present

## 2024-07-27 DIAGNOSIS — M419 Scoliosis, unspecified: Secondary | ICD-10-CM | POA: Diagnosis not present

## 2024-07-27 DIAGNOSIS — L84 Corns and callosities: Secondary | ICD-10-CM | POA: Diagnosis not present

## 2024-07-27 DIAGNOSIS — Z853 Personal history of malignant neoplasm of breast: Secondary | ICD-10-CM | POA: Diagnosis not present

## 2024-07-27 DIAGNOSIS — K219 Gastro-esophageal reflux disease without esophagitis: Secondary | ICD-10-CM | POA: Diagnosis not present

## 2024-07-27 DIAGNOSIS — M069 Rheumatoid arthritis, unspecified: Secondary | ICD-10-CM | POA: Diagnosis not present

## 2024-07-27 DIAGNOSIS — F419 Anxiety disorder, unspecified: Secondary | ICD-10-CM | POA: Diagnosis not present

## 2024-07-27 NOTE — Progress Notes (Signed)
  Subjective:  Patient ID: [name], female    DOB: June 15, 1934,   MRN: [id number]  Chief Complaint  Patient presents with   Callouses    I have this callus here on my left foot and this place on my right foot.    89 y.o. female presents for follow-up of pre-ulcerative calluses Relates still doing well and no issues Orthotics doing well.     Denies any other pedal complaints. Denies n/v/f/c.   Past Medical History:  Diagnosis Date   Abnormal cardiovascular stress test 12/2013   s/p cath with mild nonobstructive CAD normal EF - managed medically   Breast cancer (HCC) 2008   LEFT, chemo, lumpectomy   Diverticulitis    DVT (deep vein thrombosis) in pregnancy    site of PICC catheter   HTN (hypertension)    Hyperlipidemia    Hypothyroidism    Kidney tumor 2010   RFA   Personal history of chemotherapy    Personal history of radiation therapy    Prediabetes    Rheumatoid arthritis(714.0)    UTI (lower urinary tract infection)     Objective:  Physical Exam: Vascular: DP/PT pulses 2/4 bilateral. CFT <3 seconds. Normal hair growth on digits. No edema.  Skin. No lacerations or abrasions bilateral feet. Hyperkeratotic tissue noted sub 3rd metatarsal with underlying wound  healed. Hyperkeratotic tissue on right noted as well under third metatrsal Musculoskeletal: MMT 5/5 bilateral lower extremities in DF, PF, Inversion and Eversion. Deceased ROM in DF of ankle joint.  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  Neurological: Sensation intact to light touch.   Assessment:   1. Pre-ulcerative calluses   2. Plantar flexed metatarsal, unspecified laterality   3. Rheumatoid arthritis involving both feet with positive rheumatoid factor (HCC)                 Plan:  Patient was evaluated and treated and all questions answered. Ulcer left plantar foot healed.  -Debridement of hyperkeratotic tissue bilateral third metatarsals without  incident using chisel.  -Orthotics doing well.  -Answered all patient questions -Patient to return  in 8 weeks for at risk foot care -Patient advised to call the office if any problems or questions arise in the meantime.   Return in about 8 weeks (around 09/21/2024) for rfc.    Return in about 8 weeks (around 09/21/2024) for rfc.    Return in about 8 weeks (around 09/21/2024) for rfc.   Asberry Failing, DPM

## 2024-07-28 DIAGNOSIS — I2510 Atherosclerotic heart disease of native coronary artery without angina pectoris: Secondary | ICD-10-CM | POA: Diagnosis not present

## 2024-07-28 DIAGNOSIS — H8112 Benign paroxysmal vertigo, left ear: Secondary | ICD-10-CM | POA: Diagnosis not present

## 2024-07-28 DIAGNOSIS — I10 Essential (primary) hypertension: Secondary | ICD-10-CM | POA: Diagnosis not present

## 2024-07-28 DIAGNOSIS — M51369 Other intervertebral disc degeneration, lumbar region without mention of lumbar back pain or lower extremity pain: Secondary | ICD-10-CM | POA: Diagnosis not present

## 2024-07-28 DIAGNOSIS — M069 Rheumatoid arthritis, unspecified: Secondary | ICD-10-CM | POA: Diagnosis not present

## 2024-07-28 DIAGNOSIS — M48061 Spinal stenosis, lumbar region without neurogenic claudication: Secondary | ICD-10-CM | POA: Diagnosis not present

## 2024-08-01 DIAGNOSIS — I2510 Atherosclerotic heart disease of native coronary artery without angina pectoris: Secondary | ICD-10-CM | POA: Diagnosis not present

## 2024-08-01 DIAGNOSIS — H8112 Benign paroxysmal vertigo, left ear: Secondary | ICD-10-CM | POA: Diagnosis not present

## 2024-08-01 DIAGNOSIS — M51369 Other intervertebral disc degeneration, lumbar region without mention of lumbar back pain or lower extremity pain: Secondary | ICD-10-CM | POA: Diagnosis not present

## 2024-08-01 DIAGNOSIS — I10 Essential (primary) hypertension: Secondary | ICD-10-CM | POA: Diagnosis not present

## 2024-08-01 DIAGNOSIS — M069 Rheumatoid arthritis, unspecified: Secondary | ICD-10-CM | POA: Diagnosis not present

## 2024-08-01 DIAGNOSIS — M48061 Spinal stenosis, lumbar region without neurogenic claudication: Secondary | ICD-10-CM | POA: Diagnosis not present

## 2024-08-03 DIAGNOSIS — Z23 Encounter for immunization: Secondary | ICD-10-CM | POA: Diagnosis not present

## 2024-08-09 DIAGNOSIS — M51369 Other intervertebral disc degeneration, lumbar region without mention of lumbar back pain or lower extremity pain: Secondary | ICD-10-CM | POA: Diagnosis not present

## 2024-08-09 DIAGNOSIS — I2510 Atherosclerotic heart disease of native coronary artery without angina pectoris: Secondary | ICD-10-CM | POA: Diagnosis not present

## 2024-08-09 DIAGNOSIS — I10 Essential (primary) hypertension: Secondary | ICD-10-CM | POA: Diagnosis not present

## 2024-08-09 DIAGNOSIS — M48061 Spinal stenosis, lumbar region without neurogenic claudication: Secondary | ICD-10-CM | POA: Diagnosis not present

## 2024-08-09 DIAGNOSIS — M069 Rheumatoid arthritis, unspecified: Secondary | ICD-10-CM | POA: Diagnosis not present

## 2024-08-09 DIAGNOSIS — H8112 Benign paroxysmal vertigo, left ear: Secondary | ICD-10-CM | POA: Diagnosis not present

## 2024-08-18 DIAGNOSIS — I10 Essential (primary) hypertension: Secondary | ICD-10-CM | POA: Diagnosis not present

## 2024-08-18 DIAGNOSIS — M069 Rheumatoid arthritis, unspecified: Secondary | ICD-10-CM | POA: Diagnosis not present

## 2024-08-18 DIAGNOSIS — M51369 Other intervertebral disc degeneration, lumbar region without mention of lumbar back pain or lower extremity pain: Secondary | ICD-10-CM | POA: Diagnosis not present

## 2024-08-18 DIAGNOSIS — M48061 Spinal stenosis, lumbar region without neurogenic claudication: Secondary | ICD-10-CM | POA: Diagnosis not present

## 2024-08-18 DIAGNOSIS — H8112 Benign paroxysmal vertigo, left ear: Secondary | ICD-10-CM | POA: Diagnosis not present

## 2024-08-18 DIAGNOSIS — I2510 Atherosclerotic heart disease of native coronary artery without angina pectoris: Secondary | ICD-10-CM | POA: Diagnosis not present

## 2024-08-25 DIAGNOSIS — I10 Essential (primary) hypertension: Secondary | ICD-10-CM | POA: Diagnosis not present

## 2024-08-25 DIAGNOSIS — I2510 Atherosclerotic heart disease of native coronary artery without angina pectoris: Secondary | ICD-10-CM | POA: Diagnosis not present

## 2024-08-25 DIAGNOSIS — M51369 Other intervertebral disc degeneration, lumbar region without mention of lumbar back pain or lower extremity pain: Secondary | ICD-10-CM | POA: Diagnosis not present

## 2024-08-25 DIAGNOSIS — H8112 Benign paroxysmal vertigo, left ear: Secondary | ICD-10-CM | POA: Diagnosis not present

## 2024-08-25 DIAGNOSIS — M069 Rheumatoid arthritis, unspecified: Secondary | ICD-10-CM | POA: Diagnosis not present

## 2024-08-25 DIAGNOSIS — M48061 Spinal stenosis, lumbar region without neurogenic claudication: Secondary | ICD-10-CM | POA: Diagnosis not present

## 2024-09-02 ENCOUNTER — Other Ambulatory Visit: Payer: Self-pay | Admitting: Family Medicine

## 2024-09-02 DIAGNOSIS — R921 Mammographic calcification found on diagnostic imaging of breast: Secondary | ICD-10-CM

## 2024-09-06 ENCOUNTER — Other Ambulatory Visit: Payer: Self-pay | Admitting: Family Medicine

## 2024-09-06 ENCOUNTER — Ambulatory Visit
Admission: RE | Admit: 2024-09-06 | Discharge: 2024-09-06 | Disposition: A | Source: Ambulatory Visit | Attending: Family Medicine | Admitting: Family Medicine

## 2024-09-06 DIAGNOSIS — R928 Other abnormal and inconclusive findings on diagnostic imaging of breast: Secondary | ICD-10-CM

## 2024-09-06 DIAGNOSIS — R921 Mammographic calcification found on diagnostic imaging of breast: Secondary | ICD-10-CM | POA: Diagnosis not present

## 2024-09-07 DIAGNOSIS — M5116 Intervertebral disc disorders with radiculopathy, lumbar region: Secondary | ICD-10-CM | POA: Diagnosis not present

## 2024-09-07 DIAGNOSIS — M479 Spondylosis, unspecified: Secondary | ICD-10-CM | POA: Diagnosis not present

## 2024-09-23 DIAGNOSIS — M2041 Other hammer toe(s) (acquired), right foot: Secondary | ICD-10-CM | POA: Diagnosis not present

## 2024-09-23 DIAGNOSIS — M2011 Hallux valgus (acquired), right foot: Secondary | ICD-10-CM | POA: Diagnosis not present

## 2024-09-23 DIAGNOSIS — I70203 Unspecified atherosclerosis of native arteries of extremities, bilateral legs: Secondary | ICD-10-CM | POA: Diagnosis not present

## 2024-09-23 DIAGNOSIS — M2012 Hallux valgus (acquired), left foot: Secondary | ICD-10-CM | POA: Diagnosis not present

## 2024-09-23 DIAGNOSIS — M2042 Other hammer toe(s) (acquired), left foot: Secondary | ICD-10-CM | POA: Diagnosis not present

## 2024-09-23 DIAGNOSIS — L84 Corns and callosities: Secondary | ICD-10-CM | POA: Diagnosis not present

## 2024-09-23 DIAGNOSIS — M792 Neuralgia and neuritis, unspecified: Secondary | ICD-10-CM | POA: Diagnosis not present

## 2024-10-03 ENCOUNTER — Ambulatory Visit: Admitting: Podiatry

## 2025-03-07 ENCOUNTER — Encounter
